# Patient Record
Sex: Male | Born: 1950 | Race: White | Hispanic: No | Marital: Married | Smoking: Former smoker
Health system: Southern US, Community
[De-identification: ages and names within clinical notes are randomized; demographics above are authoritative.]

## PROBLEM LIST (undated history)

## (undated) DIAGNOSIS — G629 Polyneuropathy, unspecified: Secondary | ICD-10-CM

## (undated) DIAGNOSIS — I2510 Atherosclerotic heart disease of native coronary artery without angina pectoris: Secondary | ICD-10-CM

## (undated) DIAGNOSIS — I219 Acute myocardial infarction, unspecified: Secondary | ICD-10-CM

## (undated) DIAGNOSIS — Z972 Presence of dental prosthetic device (complete) (partial): Secondary | ICD-10-CM

## (undated) DIAGNOSIS — I739 Peripheral vascular disease, unspecified: Secondary | ICD-10-CM

## (undated) DIAGNOSIS — M722 Plantar fascial fibromatosis: Secondary | ICD-10-CM

## (undated) DIAGNOSIS — I10 Essential (primary) hypertension: Secondary | ICD-10-CM

## (undated) DIAGNOSIS — I779 Disorder of arteries and arterioles, unspecified: Secondary | ICD-10-CM

## (undated) DIAGNOSIS — J189 Pneumonia, unspecified organism: Secondary | ICD-10-CM

## (undated) DIAGNOSIS — E785 Hyperlipidemia, unspecified: Secondary | ICD-10-CM

## (undated) DIAGNOSIS — G709 Myoneural disorder, unspecified: Secondary | ICD-10-CM

## (undated) DIAGNOSIS — E119 Type 2 diabetes mellitus without complications: Secondary | ICD-10-CM

## (undated) HISTORY — PX: CORONARY ANGIOPLASTY: SHX604

## (undated) HISTORY — PX: TONSILLECTOMY: SUR1361

## (undated) HISTORY — PX: FRACTURE SURGERY: SHX138

## (undated) HISTORY — PX: CAROTID ENDARTERECTOMY: SUR193

---

## 2003-09-14 ENCOUNTER — Other Ambulatory Visit: Payer: Self-pay

## 2004-01-14 ENCOUNTER — Emergency Department: Payer: Self-pay | Admitting: Emergency Medicine

## 2007-01-11 ENCOUNTER — Other Ambulatory Visit: Payer: Self-pay

## 2007-01-11 ENCOUNTER — Ambulatory Visit: Payer: Self-pay | Admitting: Family Medicine

## 2007-01-12 ENCOUNTER — Inpatient Hospital Stay: Payer: Self-pay | Admitting: Internal Medicine

## 2007-01-13 ENCOUNTER — Other Ambulatory Visit: Payer: Self-pay

## 2007-01-14 ENCOUNTER — Other Ambulatory Visit: Payer: Self-pay

## 2007-02-06 ENCOUNTER — Encounter: Payer: Self-pay | Admitting: Internal Medicine

## 2007-02-18 ENCOUNTER — Encounter: Payer: Self-pay | Admitting: Internal Medicine

## 2007-03-21 ENCOUNTER — Encounter: Payer: Self-pay | Admitting: Internal Medicine

## 2007-04-21 ENCOUNTER — Encounter: Payer: Self-pay | Admitting: Internal Medicine

## 2007-05-19 ENCOUNTER — Encounter: Payer: Self-pay | Admitting: Internal Medicine

## 2007-06-19 ENCOUNTER — Encounter: Payer: Self-pay | Admitting: Internal Medicine

## 2010-02-07 ENCOUNTER — Emergency Department: Payer: Self-pay | Admitting: Emergency Medicine

## 2010-02-11 ENCOUNTER — Ambulatory Visit: Payer: Self-pay | Admitting: Family Medicine

## 2010-02-18 ENCOUNTER — Ambulatory Visit: Payer: Self-pay | Admitting: Neurology

## 2010-03-15 ENCOUNTER — Ambulatory Visit: Payer: Self-pay | Admitting: Gastroenterology

## 2012-04-01 ENCOUNTER — Ambulatory Visit: Payer: Self-pay | Admitting: Family Medicine

## 2012-04-20 ENCOUNTER — Ambulatory Visit: Payer: Self-pay | Admitting: Family Medicine

## 2012-06-03 ENCOUNTER — Ambulatory Visit: Payer: Self-pay | Admitting: Family Medicine

## 2012-06-18 ENCOUNTER — Ambulatory Visit: Payer: Self-pay | Admitting: Family Medicine

## 2012-07-29 ENCOUNTER — Ambulatory Visit: Payer: Self-pay | Admitting: Family Medicine

## 2014-01-02 ENCOUNTER — Inpatient Hospital Stay (HOSPITAL_COMMUNITY)
Admission: AD | Admit: 2014-01-02 | Discharge: 2014-01-08 | DRG: 236 | Disposition: A | Discharge status: Home / Self Care | Payer: Medicare PPO | Source: Other Acute Inpatient Hospital | Attending: Cardiothoracic Surgery | Admitting: Cardiothoracic Surgery

## 2014-01-02 ENCOUNTER — Encounter (HOSPITAL_COMMUNITY): Payer: Self-pay | Admitting: Nurse Practitioner

## 2014-01-02 ENCOUNTER — Other Ambulatory Visit: Payer: Self-pay

## 2014-01-02 ENCOUNTER — Inpatient Hospital Stay (HOSPITAL_COMMUNITY): Payer: Medicare PPO

## 2014-01-02 ENCOUNTER — Ambulatory Visit: Payer: Self-pay | Admitting: Internal Medicine

## 2014-01-02 DIAGNOSIS — I10 Essential (primary) hypertension: Secondary | ICD-10-CM

## 2014-01-02 DIAGNOSIS — Z01810 Encounter for preprocedural cardiovascular examination: Secondary | ICD-10-CM

## 2014-01-02 DIAGNOSIS — E119 Type 2 diabetes mellitus without complications: Secondary | ICD-10-CM | POA: Diagnosis present

## 2014-01-02 DIAGNOSIS — Z87891 Personal history of nicotine dependence: Secondary | ICD-10-CM

## 2014-01-02 DIAGNOSIS — Z955 Presence of coronary angioplasty implant and graft: Secondary | ICD-10-CM | POA: Diagnosis not present

## 2014-01-02 DIAGNOSIS — I2510 Atherosclerotic heart disease of native coronary artery without angina pectoris: Secondary | ICD-10-CM | POA: Diagnosis present

## 2014-01-02 DIAGNOSIS — D62 Acute posthemorrhagic anemia: Secondary | ICD-10-CM | POA: Diagnosis not present

## 2014-01-02 DIAGNOSIS — I4891 Unspecified atrial fibrillation: Secondary | ICD-10-CM | POA: Diagnosis not present

## 2014-01-02 DIAGNOSIS — I25110 Atherosclerotic heart disease of native coronary artery with unstable angina pectoris: Secondary | ICD-10-CM | POA: Diagnosis not present

## 2014-01-02 DIAGNOSIS — E8770 Fluid overload, unspecified: Secondary | ICD-10-CM | POA: Diagnosis not present

## 2014-01-02 DIAGNOSIS — Z7982 Long term (current) use of aspirin: Secondary | ICD-10-CM | POA: Diagnosis not present

## 2014-01-02 DIAGNOSIS — Z79899 Other long term (current) drug therapy: Secondary | ICD-10-CM | POA: Diagnosis not present

## 2014-01-02 DIAGNOSIS — Z7902 Long term (current) use of antithrombotics/antiplatelets: Secondary | ICD-10-CM

## 2014-01-02 DIAGNOSIS — I252 Old myocardial infarction: Secondary | ICD-10-CM | POA: Diagnosis not present

## 2014-01-02 DIAGNOSIS — K5900 Constipation, unspecified: Secondary | ICD-10-CM | POA: Diagnosis not present

## 2014-01-02 DIAGNOSIS — I200 Unstable angina: Secondary | ICD-10-CM

## 2014-01-02 DIAGNOSIS — I25810 Atherosclerosis of coronary artery bypass graft(s) without angina pectoris: Secondary | ICD-10-CM

## 2014-01-02 DIAGNOSIS — E785 Hyperlipidemia, unspecified: Secondary | ICD-10-CM | POA: Diagnosis present

## 2014-01-02 DIAGNOSIS — Z6835 Body mass index (BMI) 35.0-35.9, adult: Secondary | ICD-10-CM | POA: Diagnosis not present

## 2014-01-02 DIAGNOSIS — Z951 Presence of aortocoronary bypass graft: Secondary | ICD-10-CM

## 2014-01-02 DIAGNOSIS — I6523 Occlusion and stenosis of bilateral carotid arteries: Secondary | ICD-10-CM | POA: Diagnosis present

## 2014-01-02 HISTORY — DX: Peripheral vascular disease, unspecified: I73.9

## 2014-01-02 HISTORY — DX: Atherosclerotic heart disease of native coronary artery without angina pectoris: I25.10

## 2014-01-02 HISTORY — DX: Disorder of arteries and arterioles, unspecified: I77.9

## 2014-01-02 HISTORY — DX: Type 2 diabetes mellitus without complications: E11.9

## 2014-01-02 HISTORY — DX: Morbid (severe) obesity due to excess calories: E66.01

## 2014-01-02 HISTORY — DX: Hyperlipidemia, unspecified: E78.5

## 2014-01-02 HISTORY — DX: Essential (primary) hypertension: I10

## 2014-01-02 HISTORY — DX: Pneumonia, unspecified organism: J18.9

## 2014-01-02 HISTORY — DX: Acute myocardial infarction, unspecified: I21.9

## 2014-01-02 LAB — PULMONARY FUNCTION TEST
FEF 25-75 Post: 3.49 L/sec
FEF 25-75 Pre: 2.58 L/sec
FEF2575-%Change-Post: 35 %
FEF2575-%Pred-Post: 124 %
FEF2575-%Pred-Pre: 92 %
FEV1-%Change-Post: 9 %
FEV1-%Pred-Post: 81 %
FEV1-%Pred-Pre: 74 %
FEV1-Post: 2.88 L
FEV1-Pre: 2.63 L
FEV1FVC-%Change-Post: 1 %
FEV1FVC-%Pred-Pre: 106 %
FEV6-%Change-Post: 7 %
FEV6-%Pred-Post: 78 %
FEV6-%Pred-Pre: 72 %
FEV6-Post: 3.55 L
FEV6-Pre: 3.29 L
FEV6FVC-%Pred-Post: 105 %
FEV6FVC-%Pred-Pre: 105 %
FVC-%Change-Post: 7 %
FVC-%Pred-Post: 74 %
FVC-%Pred-Pre: 69 %
FVC-Post: 3.55 L
FVC-Pre: 3.29 L
Post FEV1/FVC ratio: 81 %
Post FEV6/FVC ratio: 100 %
Pre FEV1/FVC ratio: 80 %
Pre FEV6/FVC Ratio: 100 %

## 2014-01-02 LAB — URINALYSIS, ROUTINE W REFLEX MICROSCOPIC
Bilirubin Urine: NEGATIVE
Glucose, UA: NEGATIVE mg/dL
Hgb urine dipstick: NEGATIVE
Ketones, ur: NEGATIVE mg/dL
Leukocytes, UA: NEGATIVE
Nitrite: NEGATIVE
Protein, ur: NEGATIVE mg/dL
Specific Gravity, Urine: 1.024 (ref 1.005–1.030)
Urobilinogen, UA: 0.2 mg/dL (ref 0.0–1.0)
pH: 5.5 (ref 5.0–8.0)

## 2014-01-02 LAB — MRSA PCR SCREENING: MRSA by PCR: NEGATIVE

## 2014-01-02 LAB — HEPARIN LEVEL (UNFRACTIONATED): Heparin Unfractionated: 0.17 IU/mL — ABNORMAL LOW (ref 0.30–0.70)

## 2014-01-02 LAB — SURGICAL PCR SCREEN
MRSA, PCR: NEGATIVE
Staphylococcus aureus: POSITIVE — AB

## 2014-01-02 LAB — APTT: APTT: 42 s — AB (ref 24–37)

## 2014-01-02 LAB — GLUCOSE, CAPILLARY
Glucose-Capillary: 285 mg/dL — ABNORMAL HIGH (ref 70–99)
Glucose-Capillary: 282 mg/dL — ABNORMAL HIGH (ref 70–99)

## 2014-01-02 LAB — TYPE AND SCREEN
ABO/RH(D): AB NEG
ANTIBODY SCREEN: NEGATIVE

## 2014-01-02 LAB — PROTIME-INR
INR: 1.08 (ref 0.00–1.49)
Prothrombin Time: 14.2 seconds (ref 11.6–15.2)

## 2014-01-02 LAB — ABO/RH: ABO/RH(D): AB NEG

## 2014-01-02 LAB — PLATELET INHIBITION P2Y12: Platelet Function  P2Y12: 247 [PRU] (ref 194–418)

## 2014-01-02 MED ORDER — INSULIN ASPART 100 UNIT/ML ~~LOC~~ SOLN
0.0000 [IU] | Freq: Three times a day (TID) | SUBCUTANEOUS | Status: DC
  Administered 2014-01-02: 8 [IU] via SUBCUTANEOUS

## 2014-01-02 MED ORDER — SODIUM CHLORIDE 0.9 % IV SOLN
INTRAVENOUS | Status: AC
  Administered 2014-01-03: 69.8 mL/h via INTRAVENOUS
  Filled 2014-01-02: qty 40

## 2014-01-02 MED ORDER — LIRAGLUTIDE 18 MG/3ML ~~LOC~~ SOPN: 1.8000 mg | PEN_INJECTOR | Freq: Every day | SUBCUTANEOUS | Status: DC

## 2014-01-02 MED ORDER — CHLORHEXIDINE GLUCONATE CLOTH 2 % EX PADS: 6.0000 | MEDICATED_PAD | Freq: Every day | CUTANEOUS | Status: DC

## 2014-01-02 MED ORDER — TEMAZEPAM 15 MG PO CAPS
15.0000 mg | ORAL_CAPSULE | Freq: Once | ORAL | Status: AC | PRN
  Administered 2014-01-02: 15 mg via ORAL
  Filled 2014-01-02: qty 1

## 2014-01-02 MED ORDER — MAGNESIUM SULFATE 50 % IJ SOLN
40.0000 meq | INTRAMUSCULAR | Status: DC
  Filled 2014-01-02: qty 10

## 2014-01-02 MED ORDER — CHLORHEXIDINE GLUCONATE CLOTH 2 % EX PADS
6.0000 | MEDICATED_PAD | Freq: Once | CUTANEOUS | Status: DC
Start: 2014-01-02 — End: 2014-01-02

## 2014-01-02 MED ORDER — ONDANSETRON HCL 4 MG/2ML IJ SOLN: 4.0000 mg | Freq: Four times a day (QID) | INTRAMUSCULAR | Status: DC | PRN

## 2014-01-02 MED ORDER — ACETAMINOPHEN 325 MG PO TABS
650.0000 mg | ORAL_TABLET | ORAL | Status: DC | PRN
  Administered 2014-01-03: 650 mg via ORAL
  Filled 2014-01-02: qty 2

## 2014-01-02 MED ORDER — HEPARIN (PORCINE) IN NACL 100-0.45 UNIT/ML-% IJ SOLN
1600.0000 [IU]/h | INTRAMUSCULAR | Status: DC
  Administered 2014-01-02: 1300 [IU]/h via INTRAVENOUS
  Filled 2014-01-02 (×2): qty 250

## 2014-01-02 MED ORDER — CHLORHEXIDINE GLUCONATE CLOTH 2 % EX PADS: 6.0000 | MEDICATED_PAD | Freq: Once | CUTANEOUS | Status: DC

## 2014-01-02 MED ORDER — ADULT MULTIVITAMIN W/MINERALS CH
1.0000 | ORAL_TABLET | Freq: Every day | ORAL | Status: DC
  Filled 2014-01-02: qty 1

## 2014-01-02 MED ORDER — ASPIRIN EC 81 MG PO TBEC
81.0000 mg | DELAYED_RELEASE_TABLET | Freq: Every evening | ORAL | Status: DC
  Administered 2014-01-02: 81 mg via ORAL
  Filled 2014-01-02 (×2): qty 1

## 2014-01-02 MED ORDER — PLASMA-LYTE 148 IV SOLN
INTRAVENOUS | Status: DC
  Filled 2014-01-02: qty 2.5

## 2014-01-02 MED ORDER — SODIUM CHLORIDE 0.9 % IV SOLN
250.0000 mL | INTRAVENOUS | Status: DC | PRN
  Administered 2014-01-03: 10:00:00 via INTRAVENOUS

## 2014-01-02 MED ORDER — BISACODYL 5 MG PO TBEC: 5.0000 mg | DELAYED_RELEASE_TABLET | Freq: Once | ORAL | Status: DC

## 2014-01-02 MED ORDER — ALPRAZOLAM 0.25 MG PO TABS: 0.2500 mg | ORAL_TABLET | Freq: Every evening | ORAL | Status: DC | PRN

## 2014-01-02 MED ORDER — SIMVASTATIN 40 MG PO TABS
40.0000 mg | ORAL_TABLET | Freq: Every morning | ORAL | Status: DC
  Administered 2014-01-04 – 2014-01-05 (×2): 40 mg via ORAL
  Filled 2014-01-02 (×3): qty 1

## 2014-01-02 MED ORDER — DEXMEDETOMIDINE HCL IN NACL 400 MCG/100ML IV SOLN
0.1000 ug/kg/h | INTRAVENOUS | Status: AC
  Administered 2014-01-03: .5 ug/kg/h via INTRAVENOUS
  Filled 2014-01-02: qty 100

## 2014-01-02 MED ORDER — DEXTROSE 5 % IV SOLN
750.0000 mg | INTRAVENOUS | Status: DC
  Filled 2014-01-02: qty 750

## 2014-01-02 MED ORDER — DEXTROSE 5 % IV SOLN
1.5000 g | INTRAVENOUS | Status: AC
  Administered 2014-01-03: 1.5 g via INTRAVENOUS
  Administered 2014-01-03: .75 g via INTRAVENOUS
  Filled 2014-01-02: qty 1.5

## 2014-01-02 MED ORDER — NITROGLYCERIN 0.4 MG SL SUBL
0.4000 mg | SUBLINGUAL_TABLET | SUBLINGUAL | Status: DC | PRN
  Administered 2014-01-02: 0.4 mg via SUBLINGUAL
  Filled 2014-01-02: qty 1

## 2014-01-02 MED ORDER — ISOSORBIDE MONONITRATE ER 60 MG PO TB24
60.0000 mg | ORAL_TABLET | Freq: Every day | ORAL | Status: DC
  Filled 2014-01-02: qty 1

## 2014-01-02 MED ORDER — MUPIROCIN 2 % EX OINT
1.0000 "application " | TOPICAL_OINTMENT | Freq: Two times a day (BID) | CUTANEOUS | Status: DC
  Administered 2014-01-02: 1 via NASAL
  Filled 2014-01-02: qty 22

## 2014-01-02 MED ORDER — CARVEDILOL 12.5 MG PO TABS
12.5000 mg | ORAL_TABLET | Freq: Two times a day (BID) | ORAL | Status: DC
  Administered 2014-01-02: 12.5 mg via ORAL
  Filled 2014-01-02 (×4): qty 1

## 2014-01-02 MED ORDER — CHLORHEXIDINE GLUCONATE CLOTH 2 % EX PADS
6.0000 | MEDICATED_PAD | Freq: Once | CUTANEOUS | Status: AC
  Administered 2014-01-02: 6 via TOPICAL

## 2014-01-02 MED ORDER — POTASSIUM CHLORIDE 2 MEQ/ML IV SOLN
80.0000 meq | INTRAVENOUS | Status: DC
  Filled 2014-01-02: qty 40

## 2014-01-02 MED ORDER — EPINEPHRINE HCL 1 MG/ML IJ SOLN
0.5000 ug/min | INTRAVENOUS | Status: DC
  Filled 2014-01-02: qty 4

## 2014-01-02 MED ORDER — DEXTROSE 5 % IV SOLN
30.0000 ug/min | INTRAVENOUS | Status: AC
  Administered 2014-01-03: 40 ug/min via INTRAVENOUS
  Filled 2014-01-02: qty 2

## 2014-01-02 MED ORDER — SODIUM CHLORIDE 0.9 % IJ SOLN: 3.0000 mL | INTRAMUSCULAR | Status: DC | PRN

## 2014-01-02 MED ORDER — ALBUTEROL SULFATE (2.5 MG/3ML) 0.083% IN NEBU
2.5000 mg | INHALATION_SOLUTION | Freq: Once | RESPIRATORY_TRACT | Status: AC
  Administered 2014-01-02: 2.5 mg via RESPIRATORY_TRACT

## 2014-01-02 MED ORDER — VITAMIN C 500 MG PO TABS
1000.0000 mg | ORAL_TABLET | Freq: Every day | ORAL | Status: DC
  Filled 2014-01-02: qty 2

## 2014-01-02 MED ORDER — METOPROLOL TARTRATE 12.5 MG HALF TABLET
12.5000 mg | ORAL_TABLET | Freq: Once | ORAL | Status: AC
  Administered 2014-01-03: 12.5 mg via ORAL
  Filled 2014-01-02: qty 1

## 2014-01-02 MED ORDER — METOPROLOL TARTRATE 12.5 MG HALF TABLET
12.5000 mg | ORAL_TABLET | Freq: Once | ORAL | Status: DC
Start: 2014-01-03 — End: 2014-01-02

## 2014-01-02 MED ORDER — VANCOMYCIN HCL 10 G IV SOLR
1250.0000 mg | INTRAVENOUS | Status: AC
  Administered 2014-01-03: 1500 mg via INTRAVENOUS
  Filled 2014-01-02: qty 1250

## 2014-01-02 MED ORDER — DOPAMINE-DEXTROSE 3.2-5 MG/ML-% IV SOLN
0.0000 ug/kg/min | INTRAVENOUS | Status: AC
  Administered 2014-01-03: 3 ug/kg/min via INTRAVENOUS
  Filled 2014-01-02: qty 250

## 2014-01-02 MED ORDER — SODIUM CHLORIDE 0.9 % IV SOLN
INTRAVENOUS | Status: DC
  Filled 2014-01-02: qty 30

## 2014-01-02 MED ORDER — TEMAZEPAM 15 MG PO CAPS: 15.0000 mg | ORAL_CAPSULE | Freq: Once | ORAL | Status: DC | PRN

## 2014-01-02 MED ORDER — SODIUM CHLORIDE 0.9 % IV SOLN
INTRAVENOUS | Status: AC
  Administered 2014-01-03: 3.6 [IU]/h via INTRAVENOUS
  Filled 2014-01-02: qty 2.5

## 2014-01-02 MED ORDER — NITROGLYCERIN IN D5W 200-5 MCG/ML-% IV SOLN
2.0000 ug/min | INTRAVENOUS | Status: AC
  Administered 2014-01-03: 5 ug/min via INTRAVENOUS
  Filled 2014-01-02: qty 250

## 2014-01-02 MED ORDER — LISINOPRIL 40 MG PO TABS
40.0000 mg | ORAL_TABLET | Freq: Every day | ORAL | Status: DC
  Filled 2014-01-02: qty 1

## 2014-01-02 MED ORDER — SODIUM CHLORIDE 0.9 % IJ SOLN
3.0000 mL | Freq: Two times a day (BID) | INTRAMUSCULAR | Status: DC
  Administered 2014-01-02 (×2): 3 mL via INTRAVENOUS

## 2014-01-02 MED ORDER — CHLORHEXIDINE GLUCONATE CLOTH 2 % EX PADS: 6.0000 | MEDICATED_PAD | Freq: Once | CUTANEOUS | Status: AC

## 2014-01-02 NOTE — Progress Notes (Signed)
[id number] Discussed with pt importance of mobility and IS after surgery. Pt can get 2500 ml on IS. Discussed sternal precautions and showed pt how to get up and down without using arms. Gave [facility] booklet and care guide. Put on pre op video to view. Notified pt that he would need someone with him 24/7 first week after surgery.  Will follow up after surgery. Luetta Nuttingharlene Shinita Mac RN BSN 01/02/2014 2:56 PM

## 2014-01-02 NOTE — Consult Note (Signed)
Patient examined and coronary angiograms reviewed Critical left main stenosis with fairly well preserved LV function which will need surgical coronary revascularization.  Patient has been on Plavix since a RCA stent over 5 years ago. The patient had breakfast today, the patient took his Plavix today, the patient has had no chest pain today. We will plan on multivessel CABG tomorrow and check a platelet function study-P2Y 12 assay before surgery.  I've discussed the patient with Ms. Janeann ForehandZimmerman PA-C and agree with the above findings and plan of care.  I discussed the procedure of CABG with the patient and his family and they understand the indications, benefits, alternatives, and risks and agreed to proceed.

## 2014-01-02 NOTE — Care Management Note (Addendum)
    Page 1 of 1   01/08/2014     1:48:01 PM CARE MANAGEMENT NOTE 01/08/2014  Patient:  Jake Castro,Jake Castro   Account Number:  [id number]  Date Initiated:  01/02/2014  Documentation initiated by:  Junius CreamerWELL,DEBBIE  Subjective/Objective Assessment:   adm w pos card cath     Action/Plan:   lives w wife, pcp dr Onalee Huadavid bronstein   Anticipated DC Date:  01/08/2014   Anticipated DC Plan:  HOME W HOME HEALTH SERVICES      DC Planning Services  CM consult      Choice offered to / List presented to:             Status of service:  Completed, signed off Medicare Important Message given?  YES (If response is "NO", the following Medicare IM given date fields will be blank) Date Medicare IM given:  01/05/2014 Medicare IM given by:  MAYO,HENRIETTA Date Additional Medicare IM given:  01/08/2014 Additional Medicare IM given by:  Greggory Safranek  Discharge Disposition:  HOME/SELF CARE  Per UR Regulation:  Reviewed for med. necessity/level of care/duration of stay  If discussed at Long Length of Stay Meetings, dates discussed:    Comments:  01/05/14 1050 Henrietta Mayo RN MSN BSN CCM Per pt, he will have 24/7 assistance as his wife will be available most of the time and he has a friend who has offered to stay with him when she is not available.

## 2014-01-02 NOTE — Progress Notes (Signed)
Called lab for blood works and claimed that they are coming.

## 2014-01-02 NOTE — Progress Notes (Addendum)
VASCULAR LAB PRELIMINARY  PRELIMINARY  PRELIMINARY  PRELIMINARY  Pre-op Cardiac Surgery  Carotid Findings:  Right:  40-59% internal carotid artery stenosis at the high end of the scale.   Left:  1-39% ICA stenosis.  Bilateral:  Vertebral artery flow is antegrade.    Upper Extremity Right Left  Brachial Pressures 128  Triphasic  121  Triphasic   Radial Waveforms Triphasic  Triphasic   Ulnar Waveforms Triphasic  Triphasic   Palmar Arch (Allen's Test) Within normal limits  Within normal limits     Lower  Extremity Right Left  Dorsalis Pedis 117  Monophasic  143  Triphasic   Anterior Tibial    Posterior Tibial 141  Triphasic  132  Triphasic   Ankle/Brachial Indices 1.1 1.12     Aila Terra, RVT 01/02/2014, 5:45 PM

## 2014-01-02 NOTE — H&P (Signed)
Patient ID: Jake Castro MRN: [id number], DOB/AGE: 08-28-1950   Admit date: 01/02/2014  Primary Physician: Dorothey Baseman, MD Primary Cardiologist: Irena Cords [facility])  Pt. Profile:  63 y/o male with a h/o CAD and recent abnl stress test who presents on tx from [facility] following cath today, which revealed severe LM dzs.  Problem List  Past Medical History  Diagnosis Date  . CAD (coronary artery disease)     a. 12/2006 NSTEMI/PCI: RCA 27m/d->mid vessel stented, unable to cross dist dzs w/ balloon;  b. 11/2013 NL MV;  c. 11/2013 Echo: EF 55%;  d. 12/2013 Cath: LM 95 ost, LAD 70p, 72m, LCX 30p/m, OM1 60, RCA 20p, 25m/4m, 95d.  Marland Kitchen HTN (hypertension)   . Hyperlipidemia   . Diabetes mellitus   . Carotid arterial disease     a. s/p LCEA  . Morbid obesity     No past surgical history on file.   Allergies  Allergies  Allergen Reactions  . Kiwi Extract Nausea And Vomiting and Other (See Comments)    Throat bumps on throat    HPI  63 y/o male with the above complex past medical history.  He has a h/o L CEA in ~ 2003 and NSTEMI in 2008 with PCI of the RCA @ that time.  He is followed by [facility] and had done well since his PCI in 2008.  He says that he was relatively sedentary for several years but over the summer he bought a dog and noted that while walking his dog, he would develop sscp and dyspnea.  This was present on a daily basis and he saw his primary cardiologist in August.  He underwent echo, which showed an EF of 55%, and that was followed by stress testing which was apparently nl.  Unfortunately, he continued to have exertional chest pain and dyspnea and was placed on imdur 30 in early Sept and later this was increased to 60mg  daily.  He continued to have progressive c/p and saw his cardiologist last week and decision was made to pursue cath.  This was carried out today and revealed severe, 95% ostial LM stenosis with moderate LAD and severe mid-distal RCA dzs.   It was felt that he would require CABG and pt requested tx to [facility].  Currently he is pain free and w/o complaints.   His sheath was pulled @ ~ 8:30 this morning.   Home Medications  Prior to Admission medications   Medication Sig Start Date End Date Taking? Authorizing Provider  ALPRAZolam Prudy Feeler) 0.5 MG tablet Take 0.25-0.5 mg by mouth at bedtime as needed for anxiety or sleep.   Yes Historical Provider, MD  Ascorbic Acid (VITAMIN C) 1000 MG tablet Take 1,000 mg by mouth daily.   Yes Historical Provider, MD  aspirin EC 81 MG tablet Take 81 mg by mouth every evening.   Yes Historical Provider, MD  B Complex Vitamins (VITAMIN B-COMPLEX PO) Take 1 tablet by mouth daily.   Yes Historical Provider, MD  carvedilol (COREG) 12.5 MG tablet Take 12.5 mg by mouth 2 (two) times daily with a meal.   Yes Historical Provider, MD  Cholecalciferol (VITAMIN D-3) 5000 UNITS TABS Take 5,000 mg by mouth daily.   Yes Historical Provider, MD  clopidogrel (PLAVIX) 75 MG tablet Take 75 mg by mouth daily.   Yes Historical Provider, MD  isosorbide mononitrate (IMDUR) 30 MG 24 hr tablet Take 60 mg by mouth daily.   Yes Historical Provider, MD  KRILL  OIL PO Take 1 tablet by mouth daily.   Yes Historical Provider, MD  Liraglutide (VICTOZA) 18 MG/3ML SOPN Inject 1.8 mg into the skin daily.   Yes Historical Provider, MD  lisinopril (PRINIVIL,ZESTRIL) 40 MG tablet Take 40 mg by mouth daily.   Yes Historical Provider, MD  metFORMIN (GLUCOPHAGE) 1000 MG tablet Take 1,000 mg by mouth 2 (two) times daily with a meal.   Yes Historical Provider, MD  Multiple Vitamin (MULTIVITAMIN WITH MINERALS) TABS tablet Take 1 tablet by mouth daily.   Yes Historical Provider, MD  simvastatin (ZOCOR) 40 MG tablet Take 40 mg by mouth every morning.   Yes Historical Provider, MD  VITAMIN E PO Take 1 capsule by mouth daily.   Yes Historical Provider, MD  VITAMIN K PO Take 1 tablet by mouth daily.   Yes Historical Provider, MD   Family  History  Family History  Problem Relation Age of Onset  . Peripheral vascular disease Father     died in his 1490's - had CEA.  . Peripheral vascular disease Brother     s/p CEA  . Stroke Brother     in his 2850's  . COPD Mother     died in her 9190's.    Social History  History   Social History  . Marital Status: Married    Spouse Name: N/A    Number of Children: N/A  . Years of Education: N/A   Occupational History  . Not on file.   Social History Main Topics  . Smoking status: Former Games developermoker  . Smokeless tobacco: Not on file     Comment: smoked for a few months in his early 20's.  . Alcohol Use: No  . Drug Use: No     Comment: Previously used illicit drugs.  None in > 5 yrs.  . Sexual Activity: Not on file   Other Topics Concern  . Not on file   Social History Narrative   [place].  Was not routinely exercising.    Review of Systems General:  No chills, fever, night sweats or weight changes.  Cardiovascular:  +++ exertional chest pain, +++ dyspnea on exertion, no edema, orthopnea, palpitations, paroxysmal nocturnal dyspnea. Dermatological: No rash, lesions/masses Respiratory: No cough, +++ dyspnea Urologic: No hematuria, dysuria Abdominal:   No nausea, vomiting, diarrhea, bright red blood per rectum, melena, or hematemesis Neurologic:  No visual changes, wkns, changes in mental status. All other systems reviewed and are otherwise negative except as noted above.  Physical Exam  Blood pressure 124/60, pulse 69, temperature 98.7 F (37.1 C), temperature source Oral, resp. rate 16, height 5\' 11"  (1.803 m), weight 263 lb 0.1 oz (119.3 kg), SpO2 98.00%.  General: Pleasant, NAD Psych: Normal affect. Neuro: Alert and oriented X 3. Moves all extremities spontaneously. HEENT: Normal  Neck: Supple without bruits or JVD. Lungs:  Resp regular and unlabored, CTA. Heart: RRR no s3, s4, or murmurs. Abdomen: Soft, non-tender, non-distended, BS + x  4.  Extremities: No clubbing, cyanosis or edema. DP/PT/Radials 2+ and equal bilaterally.  R groin w/o bleeding/bruit/hematoma.  Labs  Pending   Radiology/Studies  No results found.  ECG  pending  ASSESSMENT AND PLAN  1.  USA/CAD:  Several month h/o progressive exertional c/p, dyspnea, and reduced exercise tolerance.  S/P cath today revealing severe LM/RCA dzs with moderate LAD stenosis.  Currently pain free.  He has been seen by CT surgery with plan for P2Y12 and CABG once P2Y12  acceptable.  Cont asa, statin, bb, long-acting nitrate.  Add heparin.  2.  HTN:  Stable. Cont home meds.  3.  HL:  Cont statin.  4.  DM II:  Add ssi. Cont home regimen.  5.  Morbid Obesity:  Cardiac rehab post-op.  6.  Carotid Arterial Dzs s/p LCEA:  Says he had carotid dopplers [facility] in Aug/Sept - 50% bilat dzs per his report.  Cont asa/statin.  Signed, Nicolasa Duckinghristopher Berge, NP 01/02/2014, 2:43 PM   The patient was seen, examined and discussed with Saralyn Pilarhris Berger, NP and agree as above.  63 year old male with prior history of CAD, NSTEMI/PCI RCA in 2008 who underwent an elective cardiac cath for unstable angina with findings of 95% ostial LM stenosis with moderate LAD and severe mid-distal RCA dzs. [facility]. He has been on Plavix, the last dose this am. Surgery has to see him tonight for potential CABG in the am if P2Y12 levels acceptable. P2Y12 levels ordered stat.  He is currently chest pain free, on Heparin infusion. We will continue ASA, statin, carvedilol. NPO after midnight.  Lars MassonELSON, [place] Thresher H 01/02/2014

## 2014-01-02 NOTE — Progress Notes (Signed)
Per RN, ABG in not stat, and can be done on night shift (for pre-op).  Will report to night shift.  RT unavail. Charge RT and RN aware.

## 2014-01-02 NOTE — Progress Notes (Signed)
ANTICOAGULATION CONSULT NOTE - Initial Consult  Pharmacy Consult for Heparin Indication: chest pain/ACS  Allergies  Allergen Reactions  . Kiwi Extract Nausea And Vomiting and Other (See Comments)    Throat bumps on throat     Patient Measurements: Height: 5\' 11"  (180.3 cm) Weight: 263 lb 0.1 oz (119.3 kg) IBW/kg (Calculated) : 75.3  Vital Signs: Temp: 98.7 F (37.1 C) (10/16 1330) Temp Source: Oral (10/16 1330) BP: 124/60 mmHg (10/16 1330) Pulse Rate: 69 (10/16 1330)  Labs: No results found for this basename: HGB, HCT, PLT, APTT, LABPROT, INR, HEPARINUNFRC, CREATININE, CKTOTAL, CKMB, TROPONINI,  in the last 72 hours  CrCl is unknown because no creatinine reading has been taken.   Medical History: Past Medical History  Diagnosis Date  . CAD (coronary artery disease)     a. 12/2006 NSTEMI/PCI: RCA 8674m/d->mid vessel stented, unable to cross dist dzs w/ balloon;  b. 11/2013 NL MV;  c. 11/2013 Echo: EF 55%;  d. 12/2013 Cath: LM 95 ost, LAD 70p, 3237m, LCX 30p/m, OM1 60, RCA 20p, 4123m/17m, 95d.  Marland Kitchen. HTN (hypertension)   . Hyperlipidemia   . Diabetes mellitus   . Carotid arterial disease     a. s/p LCEA  . Morbid obesity   . Myocardial infarction   . Pneumonia     Medications:  Prescriptions prior to admission  Medication Sig Dispense Refill  . ALPRAZolam (XANAX) 0.5 MG tablet Take 0.25-0.5 mg by mouth at bedtime as needed for anxiety or sleep.      . Ascorbic Acid (VITAMIN C) 1000 MG tablet Take 1,000 mg by mouth daily.      Marland Kitchen. aspirin EC 81 MG tablet Take 81 mg by mouth every evening.      . B Complex Vitamins (VITAMIN B-COMPLEX PO) Take 1 tablet by mouth daily.      . carvedilol (COREG) 12.5 MG tablet Take 12.5 mg by mouth 2 (two) times daily with a meal.      . Cholecalciferol (VITAMIN D-3) 5000 UNITS TABS Take 5,000 mg by mouth daily.      . clopidogrel (PLAVIX) 75 MG tablet Take 75 mg by mouth daily.      . isosorbide mononitrate (IMDUR) 30 MG 24 hr tablet Take 60 mg by  mouth daily.      Marland Kitchen. KRILL OIL PO Take 1 tablet by mouth daily.      . Liraglutide (VICTOZA) 18 MG/3ML SOPN Inject 1.8 mg into the skin daily.      Marland Kitchen. lisinopril (PRINIVIL,ZESTRIL) 40 MG tablet Take 40 mg by mouth daily.      . metFORMIN (GLUCOPHAGE) 1000 MG tablet Take 1,000 mg by mouth 2 (two) times daily with a meal.      . Multiple Vitamin (MULTIVITAMIN WITH MINERALS) TABS tablet Take 1 tablet by mouth daily.      . simvastatin (ZOCOR) 40 MG tablet Take 40 mg by mouth every morning.      Marland Kitchen. VITAMIN E PO Take 1 capsule by mouth daily.      Marland Kitchen. VITAMIN K PO Take 1 tablet by mouth daily.        Assessment: 63 yo M admitted 01/02/2014  S[facility] with 95% LM.  Pharmacy consulted to dose heparin.    Coag/Heme:  ACS, to start heparin no bolus with cath today.  CBC wnl  Renal: SCr 1.0 (@[facility] )  Goal of Therapy:  Heparin level 0.3-0.7 units/ml Monitor platelets by anticoagulation protocol: Yes   Plan:  1.  Heparin 1300 units/hr, now bolus. 2. Heparin level 6h after ggt starts 3. Follow up timing to stop heparin on call to [facility].  Thank you for allowing pharmacy to be a part of this patients care team.  Lovenia KimJulie Yoni Lobos Pharm.D., BCPS, AQ-Cardiology Clinical Pharmacist 01/02/2014 4:09 PM Pager: [phone] Phone: [phone]

## 2014-01-02 NOTE — Consult Note (Signed)
[address]       [place] [address]             [phone]        Jake Castro [facility] Medical Record #[id number] Date of Birth: March 14, 1951  Referring: Dr. Gwen Pounds Primary Care: Dorothey Baseman, MD  Chief Complaint:   Chest pain with exertion   History of Present Illness:     This is a 63 year old Caucasian male with cardiac risk factors that include myocardial infarction (7 years ago),diabetes mellitus, hypertension, hyperlipidemia, and remote tobacco abuse who has had retro sternal chest pain with exertion. This occurs several times per week. The pain sometimes radiates to both arms and neck and is associated with shortness of breath. He states he takes Imdur and rests when symptoms occur. He underwent a cardiac catheterization at [facility] by Dr. Gwen Pounds earlier this morning. Results showed LVEF was 55%, 95%ostial left main stenosis, proximal 70% LAD stenosis, OM1 60% stenosis, and distal RCA with 95% stenosis. In addition, he has previously had an echo which showed LVEF 50%, no AS or AI, mild MR and no MS, and no TR or PR.[name] cardiology was asked to admit the patient and Dr. Donata Clay was consulted for the consideration of coronary artery bypass grafting surgery. It should be noted that currently the patient is chest pain free. His last episode of clenching type chest pain occurred with exertion, radiated down both arms and into neck was at 4 pm yesterday.  He states he had Plavix (not a loading dose) this morning and he last ate at 10:00 am. Currently, he is in no acute distress and vital signs are stable. He is chest pain free and does not have any shortness of breath.   Current Activity/ Functional Status: Patient is independent with mobility/ambulation, transfers, ADL's, IADL's.   Zubrod Score: At the time of surgery this patient's most appropriate activity status/level should be described as: []     0    Normal activity, no symptoms [x]      1    Restricted in physical strenuous activity but ambulatory, able to do out light work []     2    Ambulatory and capable of self care, unable to do work activities, up and about                 more than 50%  Of the time                            []     3    Only limited self care, in bed greater than 50% of waking hours []     4    Completely disabled, no self care, confined to bed or chair []     5    Moribund  Past Medical History: 1.Myocardial Infarction (NSTEMI) 2008 (about 7 years ago) 2. Essential, benign hypertension 3. Type II DM 4. Hyperlipidemia 5. Anemia 6. Decreased free testosterone level 7.Hemorrhoids 8. History of left carotid artery disease 9. Obesity  Surgical History: 1. Left carotid endarterectomy (over 12 years ago) 2. Colonoscopy (03/15/2014) 3. PCI with stent to RCA  History   Social History  . Marital Status: Married    Spouse Name: N/A    Number of Children: N/A  . Years of Education: N/A   Occupational History  . Criminal lawyer   Social History Main Topics  . Smoking status:  Quit tobacco about 42 years ago   . Smokeless tobacco: Quit smokeless tobacco about 44 years ago.  . Alcohol Use: Socially  . Drug Use: Denies at this time but did in his earlier years  . Sexual Activity: Not on file   Allergies: Kiwi which causes nausea and vomiting. Not allergic to any medications.  Family History: Mother is deceased (in her 3490's) and had COPD Father deceased (around age 995) and has history of carotid artery stenosis.  Medications prior to admission: Xanax 0.5 mg one-half to one tablet by mouth at bedtime PRN Aspirin 81 mg tablet by mouth daily Wellbutrin XL 150 mg tablet by mouth daily Coreg 12.5 mg one tablet by mouth two times daily with meals Plavix 75 mg one tablet by mouth daily Imdur 30 mg one tablet by mouth daily (patient takes 60 mg if having chest pain) Metanx one tablet by mouth daily Victoza 0.3 mL (1.8 mg total) SQ  daily Lisinopril 40 mg one tablet by mouth daily Metformin 1000 mg by mouth two times daily with meals Zocor 40 mg one tablet by mouth at bedtime  Review of Systems:     Cardiac Review of Systems: Y or N  Chest Pain [  Y  ]  Resting SOB [ N  ] Exertional SOB  [ Y ]  Orthopnea [name][N  ]   Pedal Edema [  N ]    Palpitations [ N ] Syncope  [ N ]   Presyncope [  N ]  General Review of Systems: [Y] = yes [  ]=no Constitional: recent weight change [ N ]; anorexia [ N ]; fatigue [ Y ]; nausea [name][N  ]; night sweats [ N ]; fever [  N]; or chills [ N ]                                                                        Eye : blurred vision [ N ]; diplopia [ N  ]; vision changes [ N ];  Amaurosis fugax[  N]; Resp: cough [ N ];  wheezing[ N ];  hemoptysis[ N ];  GI: vomiting[ N ];  dysphagia[N  ]; melena[N  ];  hematochezia [name][N  ]; heartburn[  N];   Hx of  Colonoscopy[ Y ]; GU: kidney stones [ N ]; hematuria[N  ];   dysuria [ N ];  nocturia[ N ];  history of obstruction [  N]; urinary frequency [ N ]             Skin: rash, swelling[ N ];, hair loss[ N ];  peripheral edema[  N];  or itching[  N]; Musculosketetal: myalgias[ N ];  joint swelling[N  ];  joint erythema[ N ]; joint pain[N  ];  back pain[ N ];  Heme/Lymph: bruising[ N ];  bleeding[N  ];  anemia[ Y ];  Neuro: TIA[ N ];  stroke[ N ];  vertigo[N  ];  seizures[ N ];   paresthesias[ N ];  difficulty walking[N  ];  Psych:anxiety[Y  ];  Endocrine: diabetes[ Y ];  thyroid dysfunction[  N];   Physical Exam: General appearance: alert, cooperative and no distress HEENT: Head-atraumatic, normocephalic Eyes-EOMI, PERRLA, sclera are non icteric Neck-supple, no JVD,  no carotid bruit Heart: regular rate and rhythm, S1, S2 normal, no murmur, click, rub or gallop Lungs: clear to auscultation bilaterally Abdomen: soft, non-tender; bowel sounds normal; no masses,  no organomegaly Extremities: extremities normal, atraumatic, no cyanosis or edema Neurologic:  intact  Diagnostic Studies & Laboratory data: Sodium 135 Potassium 4.2 Chloride 100 Bun 21 Cr 1.1 Hg/Hct 12.7 38.4 Platelets 269,000     Recent Radiology Findings:   No results found.   Recent Lab Findings: No results found for this basename: WBC, HGB, HCT, PLT, GLUCOSE, CHOL, TRIG, HDL, LDLDIRECT, LDLCALC, ALT, AST, NA, K, CL, CREATININE, BUN, CO2, TSH, INR, GLUF, HGBA1C    Assessment / Plan:   1. Multivessel CAD-He had an MI 7 years ago. He now has significant left main disease included. He last had Plavix this am. We will obtain a stat P2Y12 assay to determine platelet inhibition. He is scheduled for coronary artery bypass grafting surgery in the am with Dr. Donata ClayVan Trigt. 2. DM-check HGA1C 3.Hyperlipidemia-stating per cardiology 4. Hypertension-medications per cardiology 5. Carotid artery disease-last duplex he was told showed 50% bilateral internal carotid artery disease. Check duplex US today.  Doree FudgeDonielle Zimmerman PA-C 01/02/2014 1:41 PM

## 2014-01-03 ENCOUNTER — Encounter (HOSPITAL_COMMUNITY): Payer: Medicare PPO | Admitting: Anesthesiology

## 2014-01-03 ENCOUNTER — Inpatient Hospital Stay (HOSPITAL_COMMUNITY): Payer: Medicare PPO

## 2014-01-03 ENCOUNTER — Encounter (HOSPITAL_COMMUNITY): Payer: Self-pay | Admitting: Anesthesiology

## 2014-01-03 ENCOUNTER — Inpatient Hospital Stay (HOSPITAL_COMMUNITY): Payer: Medicare PPO | Admitting: Anesthesiology

## 2014-01-03 ENCOUNTER — Encounter (HOSPITAL_COMMUNITY)
Admission: AD | Disposition: A | Discharge status: Home / Self Care | Payer: Medicare PPO | Source: Other Acute Inpatient Hospital | Attending: Cardiothoracic Surgery

## 2014-01-03 DIAGNOSIS — Z951 Presence of aortocoronary bypass graft: Secondary | ICD-10-CM

## 2014-01-03 DIAGNOSIS — I25810 Atherosclerosis of coronary artery bypass graft(s) without angina pectoris: Secondary | ICD-10-CM

## 2014-01-03 HISTORY — PX: CORONARY ARTERY BYPASS GRAFT: SHX141

## 2014-01-03 HISTORY — PX: TEE WITHOUT CARDIOVERSION: SHX5443

## 2014-01-03 LAB — CBC
HCT: 37.3 % — ABNORMAL LOW (ref 39.0–52.0)
HCT: 29.7 % — ABNORMAL LOW (ref 39.0–52.0)
HCT: 28.6 % — ABNORMAL LOW (ref 39.0–52.0)
HEMOGLOBIN: 9.9 g/dL — AB (ref 13.0–17.0)
Hemoglobin: 12.2 g/dL — ABNORMAL LOW (ref 13.0–17.0)
Hemoglobin: 9.4 g/dL — ABNORMAL LOW (ref 13.0–17.0)
MCH: 28.1 pg (ref 26.0–34.0)
MCH: 28 pg (ref 26.0–34.0)
MCH: 27.6 pg (ref 26.0–34.0)
MCHC: 32.7 g/dL (ref 30.0–36.0)
MCHC: 33.3 g/dL (ref 30.0–36.0)
MCHC: 32.9 g/dL (ref 30.0–36.0)
MCV: 85.9 fL (ref 78.0–100.0)
MCV: 84.1 fL (ref 78.0–100.0)
MCV: 83.9 fL (ref 78.0–100.0)
PLATELETS: 232 10*3/uL (ref 150–400)
Platelets: 196 10*3/uL (ref 150–400)
Platelets: 231 10*3/uL (ref 150–400)
RBC: 4.34 MIL/uL (ref 4.22–5.81)
RBC: 3.53 MIL/uL — AB (ref 4.22–5.81)
RBC: 3.41 MIL/uL — ABNORMAL LOW (ref 4.22–5.81)
RDW: 14.1 % (ref 11.5–15.5)
RDW: 14 % (ref 11.5–15.5)
RDW: 14 % (ref 11.5–15.5)
WBC: 7.4 10*3/uL (ref 4.0–10.5)
WBC: 12.7 10*3/uL — ABNORMAL HIGH (ref 4.0–10.5)
WBC: 11.7 10*3/uL — ABNORMAL HIGH (ref 4.0–10.5)

## 2014-01-03 LAB — LIPID PANEL
Cholesterol: 206 mg/dL — ABNORMAL HIGH (ref 0–200)
HDL: 43 mg/dL (ref >39)
LDL Cholesterol: 93 mg/dL (ref 0–99)
Total CHOL/HDL Ratio: 4.8 RATIO
Triglycerides: 350 mg/dL — ABNORMAL HIGH (ref <150)
VLDL: 70 mg/dL — ABNORMAL HIGH (ref 0–40)

## 2014-01-03 LAB — POCT I-STAT, CHEM 8
BUN: 15 mg/dL (ref 6–23)
BUN: 13 mg/dL (ref 6–23)
BUN: 14 mg/dL (ref 6–23)
BUN: 12 mg/dL (ref 6–23)
BUN: 12 mg/dL (ref 6–23)
BUN: 18 mg/dL (ref 6–23)
Calcium, Ion: 1.25 mmol/L (ref 1.13–1.30)
Calcium, Ion: 1.1 mmol/L — ABNORMAL LOW (ref 1.13–1.30)
Calcium, Ion: 1.21 mmol/L (ref 1.13–1.30)
Calcium, Ion: 1.09 mmol/L — ABNORMAL LOW (ref 1.13–1.30)
Calcium, Ion: 1.26 mmol/L (ref 1.13–1.30)
Calcium, Ion: 1.13 mmol/L (ref 1.13–1.30)
Chloride: 103 mEq/L (ref 96–112)
Chloride: 103 mEq/L (ref 96–112)
Chloride: 96 mEq/L (ref 96–112)
Chloride: 100 mEq/L (ref 96–112)
Chloride: 101 mEq/L (ref 96–112)
Chloride: 110 mEq/L (ref 96–112)
Creatinine, Ser: 0.7 mg/dL (ref 0.50–1.35)
Creatinine, Ser: 0.6 mg/dL (ref 0.50–1.35)
Creatinine, Ser: 0.7 mg/dL (ref 0.50–1.35)
Creatinine, Ser: 0.6 mg/dL (ref 0.50–1.35)
Creatinine, Ser: 0.7 mg/dL (ref 0.50–1.35)
Creatinine, Ser: 0.9 mg/dL (ref 0.50–1.35)
Glucose, Bld: 180 mg/dL — ABNORMAL HIGH (ref 70–99)
Glucose, Bld: 142 mg/dL — ABNORMAL HIGH (ref 70–99)
Glucose, Bld: 160 mg/dL — ABNORMAL HIGH (ref 70–99)
Glucose, Bld: 172 mg/dL — ABNORMAL HIGH (ref 70–99)
Glucose, Bld: 199 mg/dL — ABNORMAL HIGH (ref 70–99)
Glucose, Bld: 109 mg/dL — ABNORMAL HIGH (ref 70–99)
HCT: 35 % — ABNORMAL LOW (ref 39.0–52.0)
HCT: 26 % — ABNORMAL LOW (ref 39.0–52.0)
HCT: 31 % — ABNORMAL LOW (ref 39.0–52.0)
HCT: 26 % — ABNORMAL LOW (ref 39.0–52.0)
HCT: 27 % — ABNORMAL LOW (ref 39.0–52.0)
HCT: 28 % — ABNORMAL LOW (ref 39.0–52.0)
Hemoglobin: 11.9 g/dL — ABNORMAL LOW (ref 13.0–17.0)
Hemoglobin: 10.5 g/dL — ABNORMAL LOW (ref 13.0–17.0)
Hemoglobin: 8.8 g/dL — ABNORMAL LOW (ref 13.0–17.0)
Hemoglobin: 8.8 g/dL — ABNORMAL LOW (ref 13.0–17.0)
Hemoglobin: 9.2 g/dL — ABNORMAL LOW (ref 13.0–17.0)
Hemoglobin: 9.5 g/dL — ABNORMAL LOW (ref 13.0–17.0)
Potassium: 4.3 mEq/L (ref 3.7–5.3)
Potassium: 4.4 mEq/L (ref 3.7–5.3)
Potassium: 4.5 mEq/L (ref 3.7–5.3)
Potassium: 4 mEq/L (ref 3.7–5.3)
Potassium: 3.8 mEq/L (ref 3.7–5.3)
Potassium: 6.7 mEq/L (ref 3.7–5.3)
Sodium: 139 mEq/L (ref 137–147)
Sodium: 135 mEq/L — ABNORMAL LOW (ref 137–147)
Sodium: 137 mEq/L (ref 137–147)
Sodium: 138 mEq/L (ref 137–147)
Sodium: 138 mEq/L (ref 137–147)
Sodium: 133 mEq/L — ABNORMAL LOW (ref 137–147)
TCO2: 27 mmol/L (ref 0–100)
TCO2: 26 mmol/L (ref 0–100)
TCO2: 27 mmol/L (ref 0–100)
TCO2: 24 mmol/L (ref 0–100)
TCO2: 22 mmol/L (ref 0–100)
TCO2: 30 mmol/L (ref 0–100)

## 2014-01-03 LAB — POCT I-STAT 3, ART BLOOD GAS (G3+)
Acid-Base Excess: 3 mmol/L — ABNORMAL HIGH (ref 0.0–2.0)
Acid-Base Excess: 2 mmol/L (ref 0.0–2.0)
Acid-Base Excess: 2 mmol/L (ref 0.0–2.0)
Acid-base deficit: 1 mmol/L (ref 0.0–2.0)
Bicarbonate: 28.7 mEq/L — ABNORMAL HIGH (ref 20.0–24.0)
Bicarbonate: 24.7 mEq/L — ABNORMAL HIGH (ref 20.0–24.0)
Bicarbonate: 27.5 mEq/L — ABNORMAL HIGH (ref 20.0–24.0)
Bicarbonate: 27 mEq/L — ABNORMAL HIGH (ref 20.0–24.0)
Bicarbonate: 28.5 mEq/L — ABNORMAL HIGH (ref 20.0–24.0)
O2 Saturation: 100 %
O2 Saturation: 87 %
O2 Saturation: 99 %
O2 Saturation: 92 %
O2 Saturation: 95 %
Patient temperature: 37.1
Patient temperature: 37.3
TCO2: 30 mmol/L (ref 0–100)
TCO2: 26 mmol/L (ref 0–100)
TCO2: 29 mmol/L (ref 0–100)
TCO2: 29 mmol/L (ref 0–100)
TCO2: 30 mmol/L (ref 0–100)
pCO2 arterial: 51.8 mmHg — ABNORMAL HIGH (ref 35.0–45.0)
pCO2 arterial: 44.8 mmHg (ref 35.0–45.0)
pCO2 arterial: 47.3 mmHg — ABNORMAL HIGH (ref 35.0–45.0)
pCO2 arterial: 53.3 mmHg — ABNORMAL HIGH (ref 35.0–45.0)
pCO2 arterial: 52.4 mmHg — ABNORMAL HIGH (ref 35.0–45.0)
pH, Arterial: 7.351 (ref 7.350–7.450)
pH, Arterial: 7.349 — ABNORMAL LOW (ref 7.350–7.450)
pH, Arterial: 7.372 (ref 7.350–7.450)
pH, Arterial: 7.313 — ABNORMAL LOW (ref 7.350–7.450)
pH, Arterial: 7.344 — ABNORMAL LOW (ref 7.350–7.450)
pO2, Arterial: 315 mmHg — ABNORMAL HIGH (ref 80.0–100.0)
pO2, Arterial: 55 mmHg — ABNORMAL LOW (ref 80.0–100.0)
pO2, Arterial: 164 mmHg — ABNORMAL HIGH (ref 80.0–100.0)
pO2, Arterial: 70 mmHg — ABNORMAL LOW (ref 80.0–100.0)
pO2, Arterial: 84 mmHg (ref 80.0–100.0)

## 2014-01-03 LAB — GLUCOSE, CAPILLARY
Glucose-Capillary: 151 mg/dL — ABNORMAL HIGH (ref 70–99)
Glucose-Capillary: 100 mg/dL — ABNORMAL HIGH (ref 70–99)

## 2014-01-03 LAB — HEMOGLOBIN AND HEMATOCRIT, BLOOD
HCT: 25.6 % — ABNORMAL LOW (ref 39.0–52.0)
Hemoglobin: 8.6 g/dL — ABNORMAL LOW (ref 13.0–17.0)

## 2014-01-03 LAB — HEMOGLOBIN A1C
Hgb A1c MFr Bld: 7.2 % — ABNORMAL HIGH (ref <5.7)
Mean Plasma Glucose: 160 mg/dL — ABNORMAL HIGH (ref <117)

## 2014-01-03 LAB — BLOOD GAS, ARTERIAL
Acid-Base Excess: 1.2 mmol/L (ref 0.0–2.0)
Bicarbonate: 26.4 mEq/L — ABNORMAL HIGH (ref 20.0–24.0)
Drawn by: 36496
FIO2: 0.28 %
O2 Saturation: 94.7 %
Patient temperature: 98.5
TCO2: 28 mmol/L (ref 0–100)
pCO2 arterial: 50.4 mmHg — ABNORMAL HIGH (ref 35.0–45.0)
pH, Arterial: 7.339 — ABNORMAL LOW (ref 7.350–7.450)
pO2, Arterial: 77.7 mmHg — ABNORMAL LOW (ref 80.0–100.0)

## 2014-01-03 LAB — POCT I-STAT GLUCOSE
Glucose, Bld: 162 mg/dL — ABNORMAL HIGH (ref 70–99)
Glucose, Bld: 164 mg/dL — ABNORMAL HIGH (ref 70–99)
Operator id: 173792
Operator id: 3402

## 2014-01-03 LAB — POCT I-STAT 4, (NA,K, GLUC, HGB,HCT)
Glucose, Bld: 128 mg/dL — ABNORMAL HIGH (ref 70–99)
HCT: 28 % — ABNORMAL LOW (ref 39.0–52.0)
Hemoglobin: 9.5 g/dL — ABNORMAL LOW (ref 13.0–17.0)
Potassium: 3.7 mEq/L (ref 3.7–5.3)
Sodium: 140 mEq/L (ref 137–147)

## 2014-01-03 LAB — BASIC METABOLIC PANEL
ANION GAP: 11 (ref 5–15)
BUN: 17 mg/dL (ref 6–23)
CO2: 28 mEq/L (ref 19–32)
Calcium: 9 mg/dL (ref 8.4–10.5)
Chloride: 103 mEq/L (ref 96–112)
Creatinine, Ser: 0.91 mg/dL (ref 0.50–1.35)
GFR calc Af Amer: >90 mL/min (ref >90)
GFR, EST NON AFRICAN AMERICAN: 87 mL/min — AB (ref >90)
Glucose, Bld: 190 mg/dL — ABNORMAL HIGH (ref 70–99)
Potassium: 4.4 mEq/L (ref 3.7–5.3)
SODIUM: 142 meq/L (ref 137–147)

## 2014-01-03 LAB — HEPARIN LEVEL (UNFRACTIONATED): Heparin Unfractionated: 0.34 IU/mL (ref 0.30–0.70)

## 2014-01-03 LAB — CREATININE, SERUM
Creatinine, Ser: 0.85 mg/dL (ref 0.50–1.35)
GFR calc Af Amer: >90 mL/min (ref >90)
GFR calc non Af Amer: 89 mL/min — ABNORMAL LOW (ref >90)

## 2014-01-03 LAB — PLATELET COUNT: Platelets: 221 10*3/uL (ref 150–400)

## 2014-01-03 LAB — PROTIME-INR
INR: 1.3 (ref 0.00–1.49)
Prothrombin Time: 16.3 seconds — ABNORMAL HIGH (ref 11.6–15.2)

## 2014-01-03 LAB — PLATELET INHIBITION P2Y12: Platelet Function  P2Y12: 272 [PRU] (ref 194–418)

## 2014-01-03 LAB — MAGNESIUM: Magnesium: 2.3 mg/dL (ref 1.5–2.5)

## 2014-01-03 LAB — APTT: aPTT: 30 seconds (ref 24–37)

## 2014-01-03 SURGERY — CORONARY ARTERY BYPASS GRAFTING (CABG)
Anesthesia: General | Site: Chest

## 2014-01-03 MED ORDER — OXYCODONE HCL 5 MG PO TABS
5.0000 mg | ORAL_TABLET | ORAL | Status: DC | PRN
  Administered 2014-01-04 – 2014-01-08 (×18): 10 mg via ORAL
  Filled 2014-01-03 (×18): qty 2

## 2014-01-03 MED ORDER — HEMOSTATIC AGENTS (NO CHARGE) OPTIME
TOPICAL | Status: DC | PRN
  Administered 2014-01-03: 1 via TOPICAL

## 2014-01-03 MED ORDER — DEXTROSE 5 % IV SOLN
0.0000 ug/min | INTRAVENOUS | Status: DC
  Filled 2014-01-03: qty 2

## 2014-01-03 MED ORDER — PLASMA-LYTE 148 IV SOLN
INTRAVENOUS | Status: DC | PRN
  Administered 2014-01-03: 10:00:00 via INTRAVASCULAR

## 2014-01-03 MED ORDER — SODIUM CHLORIDE 0.9 % IV SOLN
INTRAVENOUS | Status: DC
  Administered 2014-01-03: 4.4 [IU]/h via INTRAVENOUS
  Filled 2014-01-03 (×2): qty 2.5

## 2014-01-03 MED ORDER — PANTOPRAZOLE SODIUM 40 MG PO TBEC
40.0000 mg | DELAYED_RELEASE_TABLET | Freq: Every day | ORAL | Status: DC
  Administered 2014-01-05 – 2014-01-08 (×4): 40 mg via ORAL
  Filled 2014-01-03 (×4): qty 1

## 2014-01-03 MED ORDER — NITROGLYCERIN IN D5W 200-5 MCG/ML-% IV SOLN: 0.0000 ug/min | INTRAVENOUS | Status: DC

## 2014-01-03 MED ORDER — ROCURONIUM BROMIDE 50 MG/5ML IV SOLN
INTRAVENOUS | Status: AC
  Filled 2014-01-03: qty 1

## 2014-01-03 MED ORDER — FENTANYL CITRATE 0.05 MG/ML IJ SOLN
INTRAMUSCULAR | Status: AC
  Filled 2014-01-03: qty 5

## 2014-01-03 MED ORDER — FAMOTIDINE IN NACL 20-0.9 MG/50ML-% IV SOLN
20.0000 mg | Freq: Two times a day (BID) | INTRAVENOUS | Status: DC
  Administered 2014-01-03: 20 mg via INTRAVENOUS

## 2014-01-03 MED ORDER — ROCURONIUM BROMIDE 50 MG/5ML IV SOLN
INTRAVENOUS | Status: AC
  Filled 2014-01-03: qty 2

## 2014-01-03 MED ORDER — ALBUMIN HUMAN 5 % IV SOLN
INTRAVENOUS | Status: DC | PRN
  Administered 2014-01-03: 14:00:00 via INTRAVENOUS

## 2014-01-03 MED ORDER — HEPARIN SODIUM (PORCINE) 1000 UNIT/ML IJ SOLN
INTRAMUSCULAR | Status: AC
  Filled 2014-01-03: qty 1

## 2014-01-03 MED ORDER — LACTATED RINGERS IV SOLN: 500.0000 mL | Freq: Once | INTRAVENOUS | Status: DC | PRN

## 2014-01-03 MED ORDER — ACETAMINOPHEN 160 MG/5ML PO SOLN: 650.0000 mg | Freq: Once | ORAL | Status: AC

## 2014-01-03 MED ORDER — MIDAZOLAM HCL 2 MG/2ML IJ SOLN: 2.0000 mg | INTRAMUSCULAR | Status: DC | PRN

## 2014-01-03 MED ORDER — SODIUM CHLORIDE 0.9 % IV SOLN: INTRAVENOUS | Status: DC

## 2014-01-03 MED ORDER — 0.9 % SODIUM CHLORIDE (POUR BTL) OPTIME
TOPICAL | Status: DC | PRN
  Administered 2014-01-03: 6000 mL

## 2014-01-03 MED ORDER — MIDAZOLAM HCL 5 MG/5ML IJ SOLN
INTRAMUSCULAR | Status: DC | PRN
  Administered 2014-01-03: 3 mg via INTRAVENOUS
  Administered 2014-01-03: 2 mg via INTRAVENOUS
  Administered 2014-01-03: 5 mg via INTRAVENOUS
  Administered 2014-01-03: 2 mg via INTRAVENOUS

## 2014-01-03 MED ORDER — PHENYLEPHRINE HCL 10 MG/ML IJ SOLN
INTRAMUSCULAR | Status: AC
  Filled 2014-01-03: qty 2

## 2014-01-03 MED ORDER — SODIUM CHLORIDE 0.9 % IJ SOLN
3.0000 mL | Freq: Two times a day (BID) | INTRAMUSCULAR | Status: DC
  Administered 2014-01-04 – 2014-01-07 (×5): 3 mL via INTRAVENOUS

## 2014-01-03 MED ORDER — MORPHINE SULFATE 2 MG/ML IJ SOLN
2.0000 mg | INTRAMUSCULAR | Status: DC | PRN
  Administered 2014-01-03 (×2): 4 mg via INTRAVENOUS
  Administered 2014-01-04: 2 mg via INTRAVENOUS
  Administered 2014-01-04 (×2): 4 mg via INTRAVENOUS
  Filled 2014-01-03: qty 1
  Filled 2014-01-03: qty 2
  Filled 2014-01-03: qty 1
  Filled 2014-01-03 (×2): qty 2
  Filled 2014-01-03: qty 1
  Filled 2014-01-03: qty 2

## 2014-01-03 MED ORDER — MAGNESIUM SULFATE 4000MG/100ML IJ SOLN
4.0000 g | Freq: Once | INTRAMUSCULAR | Status: AC
  Administered 2014-01-03: 4 g via INTRAVENOUS
  Filled 2014-01-03: qty 100

## 2014-01-03 MED ORDER — ONDANSETRON HCL 4 MG/2ML IJ SOLN: 4.0000 mg | Freq: Four times a day (QID) | INTRAMUSCULAR | Status: DC | PRN

## 2014-01-03 MED ORDER — CALCIUM CHLORIDE 10 % IV SOLN
INTRAVENOUS | Status: DC | PRN
  Administered 2014-01-03 (×3): 200 mg via INTRAVENOUS

## 2014-01-03 MED ORDER — METOCLOPRAMIDE HCL 5 MG/ML IJ SOLN
10.0000 mg | Freq: Four times a day (QID) | INTRAMUSCULAR | Status: AC
  Administered 2014-01-04 (×5): 10 mg via INTRAVENOUS
  Filled 2014-01-03 (×5): qty 2

## 2014-01-03 MED ORDER — SODIUM CHLORIDE 0.45 % IV SOLN
INTRAVENOUS | Status: DC
  Administered 2014-01-03: 15:00:00 via INTRAVENOUS

## 2014-01-03 MED ORDER — SODIUM CHLORIDE 0.9 % IJ SOLN
OROMUCOSAL | Status: DC | PRN
  Administered 2014-01-03 (×3): via TOPICAL

## 2014-01-03 MED ORDER — LACTATED RINGERS IV SOLN
INTRAVENOUS | Status: DC | PRN
  Administered 2014-01-03: 07:00:00 via INTRAVENOUS

## 2014-01-03 MED ORDER — ACETAMINOPHEN 160 MG/5ML PO SOLN
1000.0000 mg | Freq: Four times a day (QID) | ORAL | Status: DC
  Filled 2014-01-03: qty 40

## 2014-01-03 MED ORDER — ALBUMIN HUMAN 5 % IV SOLN
250.0000 mL | INTRAVENOUS | Status: AC | PRN
  Administered 2014-01-03: 250 mL via INTRAVENOUS

## 2014-01-03 MED ORDER — DOPAMINE-DEXTROSE 3.2-5 MG/ML-% IV SOLN
2.5000 ug/kg/min | INTRAVENOUS | Status: DC
Start: 2014-01-03 — End: 2014-01-03

## 2014-01-03 MED ORDER — ROCURONIUM BROMIDE 100 MG/10ML IV SOLN
INTRAVENOUS | Status: DC | PRN
  Administered 2014-01-03 (×2): 50 mg via INTRAVENOUS
  Administered 2014-01-03: 60 mg via INTRAVENOUS
  Administered 2014-01-03: 50 mg via INTRAVENOUS
  Administered 2014-01-03: 40 mg via INTRAVENOUS

## 2014-01-03 MED ORDER — FENTANYL CITRATE 0.05 MG/ML IJ SOLN
INTRAMUSCULAR | Status: DC | PRN
  Administered 2014-01-03: 100 ug via INTRAVENOUS
  Administered 2014-01-03 (×2): 500 ug via INTRAVENOUS
  Administered 2014-01-03 (×2): 250 ug via INTRAVENOUS
  Administered 2014-01-03: 400 ug via INTRAVENOUS
  Administered 2014-01-03: 250 ug via INTRAVENOUS

## 2014-01-03 MED ORDER — MIDAZOLAM HCL 10 MG/2ML IJ SOLN
INTRAMUSCULAR | Status: AC
  Filled 2014-01-03: qty 2

## 2014-01-03 MED ORDER — HEPARIN SODIUM (PORCINE) 1000 UNIT/ML IJ SOLN
INTRAMUSCULAR | Status: DC | PRN
  Administered 2014-01-03: 5000 [IU] via INTRAVENOUS
  Administered 2014-01-03: 30000 [IU] via INTRAVENOUS

## 2014-01-03 MED ORDER — CHLORHEXIDINE GLUCONATE 0.12 % MT SOLN
15.0000 mL | Freq: Two times a day (BID) | OROMUCOSAL | Status: DC
  Administered 2014-01-03 – 2014-01-04 (×2): 15 mL via OROMUCOSAL
  Filled 2014-01-03: qty 15

## 2014-01-03 MED ORDER — PROTAMINE SULFATE 10 MG/ML IV SOLN
INTRAVENOUS | Status: AC
  Filled 2014-01-03: qty 25

## 2014-01-03 MED ORDER — METOPROLOL TARTRATE 25 MG/10 ML ORAL SUSPENSION
12.5000 mg | Freq: Two times a day (BID) | ORAL | Status: DC
  Filled 2014-01-03 (×9): qty 5

## 2014-01-03 MED ORDER — PHENYLEPHRINE HCL 10 MG/ML IJ SOLN
INTRAMUSCULAR | Status: AC
  Filled 2014-01-03: qty 1

## 2014-01-03 MED ORDER — CALCIUM CHLORIDE 10 % IV SOLN
INTRAVENOUS | Status: AC
  Filled 2014-01-03: qty 10

## 2014-01-03 MED ORDER — DEXTROSE 5 % IV SOLN
1.5000 g | Freq: Two times a day (BID) | INTRAVENOUS | Status: AC
  Administered 2014-01-04 – 2014-01-05 (×3): 1.5 g via INTRAVENOUS
  Filled 2014-01-03 (×4): qty 1.5

## 2014-01-03 MED ORDER — VANCOMYCIN HCL IN DEXTROSE 1-5 GM/200ML-% IV SOLN
1000.0000 mg | Freq: Once | INTRAVENOUS | Status: AC
  Administered 2014-01-03: 1000 mg via INTRAVENOUS
  Filled 2014-01-03: qty 200

## 2014-01-03 MED ORDER — POTASSIUM CHLORIDE 10 MEQ/50ML IV SOLN
10.0000 meq | INTRAVENOUS | Status: AC
  Administered 2014-01-03 (×3): 10 meq via INTRAVENOUS

## 2014-01-03 MED ORDER — MIDAZOLAM HCL 2 MG/2ML IJ SOLN
INTRAMUSCULAR | Status: AC
  Filled 2014-01-03: qty 2

## 2014-01-03 MED ORDER — SUCCINYLCHOLINE CHLORIDE 20 MG/ML IJ SOLN
INTRAMUSCULAR | Status: AC
  Filled 2014-01-03: qty 1

## 2014-01-03 MED ORDER — ASPIRIN 81 MG PO CHEW: 324.0000 mg | CHEWABLE_TABLET | Freq: Every day | ORAL | Status: DC

## 2014-01-03 MED ORDER — ASPIRIN EC 325 MG PO TBEC
325.0000 mg | DELAYED_RELEASE_TABLET | Freq: Every day | ORAL | Status: DC
  Administered 2014-01-04 – 2014-01-08 (×5): 325 mg via ORAL
  Filled 2014-01-03 (×5): qty 1

## 2014-01-03 MED ORDER — LACTATED RINGERS IV SOLN
INTRAVENOUS | Status: DC | PRN
  Administered 2014-01-03: 08:00:00 via INTRAVENOUS

## 2014-01-03 MED ORDER — DOPAMINE-DEXTROSE 3.2-5 MG/ML-% IV SOLN: 3.0000 ug/kg/min | INTRAVENOUS | Status: DC

## 2014-01-03 MED ORDER — SODIUM CHLORIDE 0.9 % IV SOLN: 250.0000 mL | INTRAVENOUS | Status: DC

## 2014-01-03 MED ORDER — FENTANYL CITRATE 0.05 MG/ML IJ SOLN
INTRAMUSCULAR | Status: AC
Start: 2014-01-03 — End: 2014-01-03
  Filled 2014-01-03: qty 5

## 2014-01-03 MED ORDER — PROTAMINE SULFATE 10 MG/ML IV SOLN
INTRAVENOUS | Status: DC | PRN
  Administered 2014-01-03: 300 mg via INTRAVENOUS

## 2014-01-03 MED ORDER — LIDOCAINE HCL (CARDIAC) 20 MG/ML IV SOLN
INTRAVENOUS | Status: DC | PRN
Start: 2014-01-03 — End: 2014-01-03

## 2014-01-03 MED ORDER — DOCUSATE SODIUM 100 MG PO CAPS
200.0000 mg | ORAL_CAPSULE | Freq: Every day | ORAL | Status: DC
  Administered 2014-01-04 – 2014-01-08 (×3): 200 mg via ORAL
  Filled 2014-01-03 (×4): qty 2

## 2014-01-03 MED ORDER — LACTATED RINGERS IV SOLN: INTRAVENOUS | Status: DC

## 2014-01-03 MED ORDER — PROPOFOL 10 MG/ML IV BOLUS
INTRAVENOUS | Status: DC | PRN
  Administered 2014-01-03: 150 mg via INTRAVENOUS

## 2014-01-03 MED ORDER — LIDOCAINE HCL (CARDIAC) 20 MG/ML IV SOLN
INTRAVENOUS | Status: AC
  Filled 2014-01-03: qty 5

## 2014-01-03 MED ORDER — DEXMEDETOMIDINE HCL IN NACL 200 MCG/50ML IV SOLN: 0.0000 ug/kg/h | INTRAVENOUS | Status: DC

## 2014-01-03 MED ORDER — ACETAMINOPHEN 650 MG RE SUPP
650.0000 mg | Freq: Once | RECTAL | Status: AC
  Administered 2014-01-03: 650 mg via RECTAL

## 2014-01-03 MED ORDER — POTASSIUM CHLORIDE 10 MEQ/50ML IV SOLN
10.0000 meq | INTRAVENOUS | Status: DC | PRN
  Administered 2014-01-03: 10 meq via INTRAVENOUS

## 2014-01-03 MED ORDER — SODIUM CHLORIDE 0.9 % IJ SOLN: 3.0000 mL | INTRAMUSCULAR | Status: DC | PRN

## 2014-01-03 MED ORDER — ONDANSETRON HCL 4 MG/2ML IJ SOLN
INTRAMUSCULAR | Status: AC
  Filled 2014-01-03: qty 2

## 2014-01-03 MED ORDER — TRAMADOL HCL 50 MG PO TABS
50.0000 mg | ORAL_TABLET | ORAL | Status: DC | PRN
  Administered 2014-01-04: 100 mg via ORAL
  Filled 2014-01-03: qty 2

## 2014-01-03 MED ORDER — MORPHINE SULFATE 2 MG/ML IJ SOLN
1.0000 mg | INTRAMUSCULAR | Status: AC | PRN
  Administered 2014-01-03 (×2): 2 mg via INTRAVENOUS
  Filled 2014-01-03 (×2): qty 2

## 2014-01-03 MED ORDER — BISACODYL 5 MG PO TBEC
10.0000 mg | DELAYED_RELEASE_TABLET | Freq: Every day | ORAL | Status: DC
  Administered 2014-01-04 – 2014-01-08 (×3): 10 mg via ORAL
  Filled 2014-01-03 (×3): qty 2

## 2014-01-03 MED ORDER — CETYLPYRIDINIUM CHLORIDE 0.05 % MT LIQD
7.0000 mL | Freq: Four times a day (QID) | OROMUCOSAL | Status: DC
  Administered 2014-01-04 (×4): 7 mL via OROMUCOSAL

## 2014-01-03 MED ORDER — INSULIN REGULAR BOLUS VIA INFUSION
0.0000 [IU] | Freq: Three times a day (TID) | INTRAVENOUS | Status: DC
  Filled 2014-01-03: qty 10

## 2014-01-03 MED ORDER — PROPOFOL 10 MG/ML IV BOLUS
INTRAVENOUS | Status: AC
  Filled 2014-01-03: qty 20

## 2014-01-03 MED ORDER — METOPROLOL TARTRATE 12.5 MG HALF TABLET
12.5000 mg | ORAL_TABLET | Freq: Two times a day (BID) | ORAL | Status: DC
  Administered 2014-01-04 – 2014-01-06 (×6): 12.5 mg via ORAL
  Filled 2014-01-03 (×9): qty 1

## 2014-01-03 MED ORDER — SODIUM CHLORIDE 0.9 % IJ SOLN
INTRAMUSCULAR | Status: AC
  Filled 2014-01-03: qty 20

## 2014-01-03 MED ORDER — ARTIFICIAL TEARS OP OINT
TOPICAL_OINTMENT | OPHTHALMIC | Status: DC | PRN
  Administered 2014-01-03: 1 via OPHTHALMIC

## 2014-01-03 MED ORDER — DIPHENHYDRAMINE HCL 50 MG/ML IJ SOLN
INTRAMUSCULAR | Status: DC | PRN
  Administered 2014-01-03: 50 mg via INTRAVENOUS

## 2014-01-03 MED ORDER — SUCCINYLCHOLINE CHLORIDE 20 MG/ML IJ SOLN
INTRAMUSCULAR | Status: DC | PRN
  Administered 2014-01-03: 160 mg via INTRAVENOUS

## 2014-01-03 MED ORDER — BISACODYL 10 MG RE SUPP
10.0000 mg | Freq: Every day | RECTAL | Status: DC
Start: 2014-01-04 — End: 2014-01-08

## 2014-01-03 MED ORDER — ACETAMINOPHEN 500 MG PO TABS
1000.0000 mg | ORAL_TABLET | Freq: Four times a day (QID) | ORAL | Status: DC
  Administered 2014-01-04 – 2014-01-07 (×13): 1000 mg via ORAL
  Filled 2014-01-03 (×20): qty 2

## 2014-01-03 MED ORDER — METOPROLOL TARTRATE 1 MG/ML IV SOLN: 2.5000 mg | INTRAVENOUS | Status: DC | PRN

## 2014-01-03 SURGICAL SUPPLY — 108 items
ADAPTER CARDIO PERF ANTE/RETRO (ADAPTER) ×4 IMPLANT
ATTRACTOMAT 16X20 MAGNETIC DRP (DRAPES) ×4 IMPLANT
BAG DECANTER FOR FLEXI CONT (MISCELLANEOUS) ×4 IMPLANT
BANDAGE ELASTIC 4 VELCRO ST LF (GAUZE/BANDAGES/DRESSINGS) ×4 IMPLANT
BANDAGE ELASTIC 6 VELCRO ST LF (GAUZE/BANDAGES/DRESSINGS) ×4 IMPLANT
BASKET HEART  (ORDER IN 25'S) (MISCELLANEOUS) ×1
BASKET HEART (ORDER IN 25'S) (MISCELLANEOUS) ×1
BASKET HEART (ORDER IN 25S) (MISCELLANEOUS) ×2 IMPLANT
BENZOIN TINCTURE PRP APPL 2/3 (GAUZE/BANDAGES/DRESSINGS) ×4 IMPLANT
BLADE STERNUM SYSTEM 6 (BLADE) ×4 IMPLANT
BLADE SURG 11 STRL SS (BLADE) ×4 IMPLANT
BLADE SURG 12 STRL SS (BLADE) ×4 IMPLANT
BLADE SURG ROTATE 9660 (MISCELLANEOUS) IMPLANT
BNDG GAUZE ELAST 4 BULKY (GAUZE/BANDAGES/DRESSINGS) ×4 IMPLANT
CANISTER SUCTION 2500CC (MISCELLANEOUS) ×4 IMPLANT
CANNULA GUNDRY RCSP 15FR (MISCELLANEOUS) ×8 IMPLANT
CANNULA VENOUS LOW PROF 32X40 (CANNULA) IMPLANT
CARDIAC SUCTION (MISCELLANEOUS) ×4 IMPLANT
CATH CPB KIT VANTRIGT (MISCELLANEOUS) ×4 IMPLANT
CATH ROBINSON RED A/P 18FR (CATHETERS) ×12 IMPLANT
CATH THORACIC 36FR RT ANG (CATHETERS) ×4 IMPLANT
CLIP FOGARTY SPRING 6M (CLIP) ×4 IMPLANT
CLIP TI WIDE RED SMALL 24 (CLIP) IMPLANT
CLOSURE STERI-STRIP 1/2X4 (GAUZE/BANDAGES/DRESSINGS) ×1
CLOSURE WOUND 1/2 X4 (GAUZE/BANDAGES/DRESSINGS) ×1
CLSR STERI-STRIP ANTIMIC 1/2X4 (GAUZE/BANDAGES/DRESSINGS) ×3 IMPLANT
COVER SURGICAL LIGHT HANDLE (MISCELLANEOUS) ×4 IMPLANT
CRADLE DONUT ADULT HEAD (MISCELLANEOUS) ×4 IMPLANT
DRAIN CHANNEL 32F RND 10.7 FF (WOUND CARE) ×4 IMPLANT
DRAPE CARDIOVASCULAR INCISE (DRAPES) ×2
DRAPE SLUSH/WARMER DISC (DRAPES) ×4 IMPLANT
DRAPE SRG 135X102X78XABS (DRAPES) ×2 IMPLANT
DRSG AQUACEL AG ADV 3.5X14 (GAUZE/BANDAGES/DRESSINGS) ×4 IMPLANT
DRSG COVADERM 4X14 (GAUZE/BANDAGES/DRESSINGS) ×4 IMPLANT
ELECT BLADE 4.0 EZ CLEAN MEGAD (MISCELLANEOUS) ×4
ELECT BLADE 6.5 EXT (BLADE) ×4 IMPLANT
ELECT CAUTERY BLADE 6.4 (BLADE) ×4 IMPLANT
ELECT REM PT RETURN 9FT ADLT (ELECTROSURGICAL) ×8
ELECTRODE BLDE 4.0 EZ CLN MEGD (MISCELLANEOUS) ×2 IMPLANT
ELECTRODE REM PT RTRN 9FT ADLT (ELECTROSURGICAL) ×4 IMPLANT
GAUZE SPONGE 4X4 12PLY STRL (GAUZE/BANDAGES/DRESSINGS) ×8 IMPLANT
GLOVE BIO SURGEON STRL SZ 6.5 (GLOVE) ×9 IMPLANT
GLOVE BIO SURGEON STRL SZ7.5 (GLOVE) ×12 IMPLANT
GLOVE BIO SURGEONS STRL SZ 6.5 (GLOVE) ×3
GLOVE BIOGEL PI IND STRL 7.0 (GLOVE) ×8 IMPLANT
GLOVE BIOGEL PI INDICATOR 7.0 (GLOVE) ×8
GOWN STRL REUS W/ TWL LRG LVL3 (GOWN DISPOSABLE) ×12 IMPLANT
GOWN STRL REUS W/TWL LRG LVL3 (GOWN DISPOSABLE) ×12
HEMOSTAT POWDER SURGIFOAM 1G (HEMOSTASIS) ×12 IMPLANT
HEMOSTAT SURGICEL 2X14 (HEMOSTASIS) ×4 IMPLANT
INSERT FOGARTY XLG (MISCELLANEOUS) IMPLANT
KIT BASIN OR (CUSTOM PROCEDURE TRAY) ×4 IMPLANT
KIT ROOM TURNOVER OR (KITS) ×4 IMPLANT
KIT SUCTION CATH 14FR (SUCTIONS) ×4 IMPLANT
KIT VASOVIEW W/TROCAR VH 2000 (KITS) ×4 IMPLANT
LEAD PACING MYOCARDI (MISCELLANEOUS) ×4 IMPLANT
MARKER GRAFT CORONARY BYPASS (MISCELLANEOUS) ×12 IMPLANT
NS IRRIG 1000ML POUR BTL (IV SOLUTION) ×24 IMPLANT
PACK OPEN HEART (CUSTOM PROCEDURE TRAY) ×4 IMPLANT
PAD ARMBOARD 7.5X6 YLW CONV (MISCELLANEOUS) ×8 IMPLANT
PAD ELECT DEFIB RADIOL ZOLL (MISCELLANEOUS) ×4 IMPLANT
PENCIL BUTTON HOLSTER BLD 10FT (ELECTRODE) ×4 IMPLANT
PUNCH AORTIC ROTATE 4.0MM (MISCELLANEOUS) IMPLANT
PUNCH AORTIC ROTATE 4.5MM 8IN (MISCELLANEOUS) ×4 IMPLANT
PUNCH AORTIC ROTATE 5MM 8IN (MISCELLANEOUS) IMPLANT
SET CARDIOPLEGIA MPS 5001102 (MISCELLANEOUS) ×4 IMPLANT
SPONGE GAUZE 4X4 12PLY STER LF (GAUZE/BANDAGES/DRESSINGS) ×8 IMPLANT
SPONGE LAP 18X18 X RAY DECT (DISPOSABLE) ×8 IMPLANT
SPONGE LAP 4X18 X RAY DECT (DISPOSABLE) ×4 IMPLANT
STRIP CLOSURE SKIN 1/2X4 (GAUZE/BANDAGES/DRESSINGS) ×3 IMPLANT
SURGIFLO TRUKIT (HEMOSTASIS) ×4 IMPLANT
SURGIFLO W/THROMBIN 8M KIT (HEMOSTASIS) ×4 IMPLANT
SUT BONE WAX W31G (SUTURE) ×4 IMPLANT
SUT MNCRL AB 4-0 PS2 18 (SUTURE) ×4 IMPLANT
SUT PROLENE 3 0 SH DA (SUTURE) IMPLANT
SUT PROLENE 3 0 SH1 36 (SUTURE) IMPLANT
SUT PROLENE 4 0 RB 1 (SUTURE) ×2
SUT PROLENE 4 0 SH DA (SUTURE) ×4 IMPLANT
SUT PROLENE 4-0 RB1 .5 CRCL 36 (SUTURE) ×2 IMPLANT
SUT PROLENE 5 0 C 1 36 (SUTURE) IMPLANT
SUT PROLENE 6 0 C 1 30 (SUTURE) ×16 IMPLANT
SUT PROLENE 6 0 CC (SUTURE) ×12 IMPLANT
SUT PROLENE 8 0 BV175 6 (SUTURE) ×16 IMPLANT
SUT PROLENE BLUE 7 0 (SUTURE) ×8 IMPLANT
SUT SILK  1 MH (SUTURE)
SUT SILK 1 MH (SUTURE) IMPLANT
SUT SILK 2 0 SH CR/8 (SUTURE) ×4 IMPLANT
SUT SILK 3 0 SH CR/8 (SUTURE) IMPLANT
SUT STEEL 6MS V (SUTURE) ×8 IMPLANT
SUT STEEL SZ 6 DBL 3X14 BALL (SUTURE) ×12 IMPLANT
SUT VIC AB 1 CTX 36 (SUTURE) ×12
SUT VIC AB 1 CTX36XBRD ANBCTR (SUTURE) ×12 IMPLANT
SUT VIC AB 2-0 CT1 27 (SUTURE)
SUT VIC AB 2-0 CT1 36 (SUTURE) ×4 IMPLANT
SUT VIC AB 2-0 CT1 TAPERPNT 27 (SUTURE) IMPLANT
SUT VIC AB 2-0 CTX 27 (SUTURE) IMPLANT
SUT VIC AB 3-0 X1 27 (SUTURE) IMPLANT
SUTURE E-PAK OPEN HEART (SUTURE) ×4 IMPLANT
SYSTEM SAHARA CHEST DRAIN ATS (WOUND CARE) ×4 IMPLANT
TAPE CLOTH SURG 4X10 WHT LF (GAUZE/BANDAGES/DRESSINGS) ×4 IMPLANT
TAPE PAPER 2X10 WHT MICROPORE (GAUZE/BANDAGES/DRESSINGS) ×4 IMPLANT
TOWEL OR 17X24 6PK STRL BLUE (TOWEL DISPOSABLE) ×8 IMPLANT
TOWEL OR 17X26 10 PK STRL BLUE (TOWEL DISPOSABLE) ×8 IMPLANT
TRAY FOLEY IC TEMP SENS 16FR (CATHETERS) ×4 IMPLANT
TUBING INSUFFLATION (TUBING) ×4 IMPLANT
UNDERPAD 30X30 INCONTINENT (UNDERPADS AND DIAPERS) ×4 IMPLANT
WATER STERILE IRR 1000ML POUR (IV SOLUTION) ×8 IMPLANT
YANKAUER SUCT BULB TIP NO VENT (SUCTIONS) ×4 IMPLANT

## 2014-01-03 NOTE — Anesthesia Preprocedure Evaluation (Addendum)
Anesthesia Evaluation  Patient identified by MRN, date of birth, ID band Patient awake    Reviewed: Allergy & Precautions, H&P , NPO status   History of Anesthesia Complications Negative for: history of anesthetic complications  Airway Mallampati: II      Dental  (+) Edentulous Lower, Edentulous Upper   Pulmonary neg pulmonary ROS, former smoker,    Pulmonary exam normal       Cardiovascular hypertension, Pt. on medications + angina with exertion + CAD, + Past MI and + Peripheral Vascular Disease Rhythm:Regular     Neuro/Psych negative neurological ROS  negative psych ROS   GI/Hepatic negative GI ROS, Neg liver ROS,   Endo/Other  diabetes, Type 2, Oral Hypoglycemic AgentsMorbid [phone]  Renal/GU      Musculoskeletal   Abdominal   Peds  Hematology negative hematology ROS (+)   Anesthesia Other Findings   Reproductive/Obstetrics                          Anesthesia Physical Anesthesia Plan  ASA: III  Anesthesia Plan: General   Post-op Pain Management:    Induction: Intravenous  Airway Management Planned: Oral ETT  Additional Equipment: Arterial line, CVP, PA Cath and TEE  Intra-op Plan: Utilization of Controlled Hypotension per surrgeon request  Post-operative Plan: Post-operative intubation/ventilation  Informed Consent: I have reviewed the patients History and Physical, chart, labs and discussed the procedure including the risks, benefits and alternatives for the proposed anesthesia with the patient or authorized representative who has indicated his/her understanding and acceptance.     Plan Discussed with: CRNA, Anesthesiologist and Surgeon  Anesthesia Plan Comments:         Anesthesia Quick Evaluation

## 2014-01-03 NOTE — Addendum Note (Signed)
Addendum created 01/03/14 1504 by Coralee Rudobert Rhema Boyett, CRNA   Modules edited: Anesthesia Medication Administration

## 2014-01-03 NOTE — Anesthesia Procedure Notes (Signed)
Procedure Name: Intubation Date/Time: 01/03/2014 8:00 AM Performed by: Coralee RudFLORES, Kayon Pre-anesthesia Checklist: Patient identified, Emergency Drugs available, Suction available and Patient being monitored Patient Re-evaluated:Patient Re-evaluated prior to inductionOxygen Delivery Method: Circle system utilized Preoxygenation: Pre-oxygenation with 100% oxygen Ventilation: Mask ventilation without difficulty and Oral airway inserted - appropriate to patient size Laryngoscope Size: Miller and 3 Grade View: Grade I Tube type: Oral Tube size: 8.5 mm Number of attempts: 1 Airway Equipment and Method: Stylet Placement Confirmation: ETT inserted through vocal cords under direct vision and positive ETCO2 Secured at: 23 cm Tube secured with: Tape Dental Injury: Teeth and Oropharynx as per pre-operative assessment

## 2014-01-03 NOTE — Progress Notes (Signed)
ANTICOAGULATION CONSULT NOTE - Follow Up Consult  Pharmacy Consult for Heparin  Indication: chest pain/ACS  Allergies  Allergen Reactions  . Kiwi Extract Nausea And Vomiting and Other (See Comments)    Throat bumps on throat     Patient Measurements: Height: 5\' 11"  (180.3 cm) Weight: 263 lb 0.1 oz (119.3 kg) IBW/kg (Calculated) : 75.3  Vital Signs: Temp: 98.5 F (36.9 C) (10/16 2320) Temp Source: Oral (10/16 2320) BP: 165/73 mmHg (10/16 2100) Pulse Rate: 74 (10/16 2100)  Labs:  Recent Labs  01/02/14 1930 01/02/14 2140  APTT 42*  --   LABPROT 14.2  --   INR 1.08  --   HEPARINUNFRC  --  0.17*    Assessment: [facility]. First HL is 0.17. Other labs as above. CABG today.   Goal of Therapy:  Heparin level 0.3-0.7 units/ml Monitor platelets by anticoagulation protocol: Yes   Plan:  -Increase heparin to 1600 units/hr -0800 HL -CABG this AM, so likely no further levels past the 0800 level -Monitor for bleeding  Abran DukeLedford, Meagon Duskin 01/03/2014,12:16 AM

## 2014-01-03 NOTE — Transfer of Care (Signed)
Immediate Anesthesia Transfer of Care Note  Patient: Jake Castro  Procedure(s) Performed: Procedure(s): CORONARY ARTERY BYPASS GRAFTING (CABG) x four, using left internal mammary and right leg greater saphenous vein harvested endoscopically (N/A) TRANSESOPHAGEAL ECHOCARDIOGRAM (TEE) (N/A)  Patient Location: SICU  Anesthesia Type:General  Level of Consciousness: Patient remains intubated per anesthesia plan  Airway & Oxygen Therapy: Patient remains intubated per anesthesia plan and Patient placed on Ventilator (see vital sign flow sheet for setting)  Post-op Assessment: Report given to PACU RN, Post -op Vital signs reviewed and stable and Patient moving all extremities  Post vital signs: Reviewed and stable  Complications: No apparent anesthesia complications

## 2014-01-03 NOTE — Anesthesia Postprocedure Evaluation (Signed)
  Anesthesia Post-op Note  Patient: Jake Castro  Procedure(s) Performed: Procedure(s): CORONARY ARTERY BYPASS GRAFTING (CABG) x four, using left internal mammary and right leg greater saphenous vein harvested endoscopically (N/A) TRANSESOPHAGEAL ECHOCARDIOGRAM (TEE) (N/A)  Patient Location: SICU  Anesthesia Type:General  Level of Consciousness: sedated and Patient remains intubated per anesthesia plan  Airway and Oxygen Therapy: Patient remains intubated per anesthesia plan and Patient placed on Ventilator (see vital sign flow sheet for setting)  Post-op Pain: none  Post-op Assessment: Post-op Vital signs reviewed, Patient's Cardiovascular Status Stable, Respiratory Function Stable, Patent Airway, No signs of Nausea or vomiting and Pain level controlled  Post-op Vital Signs: stable  Last Vitals:  Filed Vitals:   01/03/14 0446  BP: 155/77  Pulse: 85  Temp: 36.6 C  Resp: 25    Complications: No apparent anesthesia complications

## 2014-01-03 NOTE — Progress Notes (Signed)
The patient was examined and preop studies reviewed. There has been no change from the prior exam and the patient is ready for surgery.   Plan CABG on [name] today

## 2014-01-03 NOTE — OR Nursing (Signed)
13:15 - 1st call to SICU charge nurse 

## 2014-01-03 NOTE — Procedures (Signed)
Extubation Procedure Note  Patient Details:   Name: Jake Castro DOB: [birth year] MRN: [id number]   Airway Documentation:     Evaluation  O2 sats: stable throughout Complications: No apparent complications Patient did tolerate procedure well. Bilateral Breath Sounds: Clear;Diminished   Yes  Pt tolerated wean, VC 1.0L, NIF -25, positive for cuff leak, extubated to 5L [place]. No stridor or dyspnea noted after extubation. Pt achieved around Jasmine DJuel BuLucianne [id number]n NeighbKarie SchwalbBaylor Emergency Medical Center At AubreyDaphine DeutschWoodinvT J Samson CommEzequielBil541516-Harolyn Rutherf(360)Alen Ble heJasmine DJuel Lucianne Mu42ss23KentuckySc9616Jamison NeighbKarie SchwalbFilutowski Eye Institute Pa Dba Lake Mary Surgical CenterDaphine DeutschHinAthens Orthopedic Clinic Ambulatory Surgery Center EzequielBil838780-Harolyn Rutherf709-Alen Ble heJasmine DJuel BuScotty CourEncomLucianne Mu14ss64Kentuckyss9616Jamison NeighbKarie SchwalbEncompass Health Deaconess Hospital IncDaphine DeutschSouth GKaiser Fnd [phone] Harolyn Rutherf819-Alen Ble heJasmine DJuel BLucianne Mu37ss50Kentuckyco9616Jamison NeighbKarie SchwalbSanford Medical Center WheatonDaphine DeutschBlesEast BEzequielBil939331-Harolyn Rutherf90Alen Ble heJasmine DJuel BScoLucianne Mu58ss31Kentuckyy 9616Jamison NeighbKarie SchwalbCentral Delaware Endoscopy Unit LLCDaphine DeutschSunSouthern EndosEzequielBil567(703) Harolyn Rutherf(716)Alen Ble heJasmine DJueLucianne Mu41ss38KentuckyBu9616Jamison NeighbKarie SchwalbTristar Southern Hills Medical CenterDaphine DeutschCConnecticut Surgery Center LimitEzequielBil913(763)Harolyn Rutherf73Alen Ble heJasmine DLucianne Mu21ss51Kentuckyel9616Jamison NeighbKarie SchwalbWashington Health GreeneDaphine DeutschFishing CGrays Harbor Community HEzequielBil8804-Harolyn Rutherf424-Alen Ble heJasmine DLucianne Mu17ss33Kentuckyel9616Jamison NeighbKarie SchwalbChristus Ochsner St Patrick HospitalDaphine DeutschHorUrology Surgery CentEzequielBil939(204)Harolyn Rutherf202-Alen Ble heJasmLucianne Mu4ss70Kentuckye 9616Jamison NeighbKarie SchwalbProvidence Medical CenterDaphine D[phone]-Harolyn Rutherf667-Alen Ble heJasmine DJuel BuScotty CourUc San DiegoLucianne Mu10ss37Kentuckyea9616Jamison NeighbKarie SchwalbCincinnati Children'S Hospital Medical Center At Lindner CenterDaphine DeutschOcklaClaxton-Hepburn EzequielBil216(570)Harolyn Rutherf802-Alen Ble heJasmine DJuel BuScotty CourPoLucianne Mu58ss75Kentuckyar9616Jamison NeighbKarie SchwalbNovant Health Huntersville Outpatient Surgery CenterDaphine DeutschMonHighsmith-Rainey MemEzequielBil425434-Harolyn Rutherf720-Alen Ble heJasmine DJuel BuScLucianne Mu5ss33Kentuckyty9616Jamison NeighbKarie SchwalbHosp General Menonita - CayeyDaphine DeutschAu SMemorial [phone]-Harolyn Rutherf(440)Alen Ble heJasmine DJuel BuScotty CourSelectLucianne Mu18ss79Kentuckype9616Jamison NeighbKarie SchwalbBaypointe Behavioral HealthDaphine DeutschABon Secours-St Francis [id number] Harolyn Rutherf254-Alen Ble heJasmine DJuel BuScoLucianne Mu88ss48Kentuckyy 9616Jamison NeighbKarie SchwalbRose Medical CenterDaphine DeutschPittZuni Comprehensive CommunityEzequielBil716787-Harolyn Rutherf825-Alen Ble heJasmine DLucianne Mu73ss64Kentuckyel9616Jamison NeighbKarie SchwalbCataract And Laser Center Associates PcDaphine DeutschMNoEzequielBil430(971)Harolyn Rutherf336-Alen Ble heJasmine DLucianne Mu83ss82Kentuckyel9616Jamison NeighbKarie SchwalbLaser And Cataract Center Of Shreveport LLCDaphine DeutschPLake Mary SurgEzequielBil409619 Harolyn Rutherf(984)Alen Ble heJasmine Lucianne Mu82ss65Kentuckyue9616Jamison NeighbKarie SchwalbEndoscopy Consultants LLCDaphine DeutschCherCentral Utah Clinic EzequielBil539413-Harolyn Rutherf484-Alen Ble heJasmine DJLucianne Mu51ss63[place]yl 9616Jamison NeighbKarie SchwalbMemorial HospitalDaphine DeutschRock Elite SurEzequielBil731860-Harolyn Rutherf(708)Alen Ble heJasLucianne Mu43ss41Kentuckyne9616Jamison NeighbKarie SchwalbSunnyview Rehabilitation HospitalDaphine DeutschCarmel-by-theLos Angeles AmbulatoEzequielBil978(330)Harolyn Rutherf670-Alen Ble heJasmine DJuel BuScotty CourSouthwell AmbulatoryLucianne Mu31ss29Kentuckync9616Jamison NeighbKarie SchwalbEyesight Laser And Surgery CtrDaphine DeutschDavis Lanai CommEzequielBil([phone]-Harolyn Rutherf919-Alen Ble heJasmine DJuel BuScottyLucianne Mu51ss48Kentuckyou9616Jamison NeighbKarie SchwalbOro Valley HospitalDaphine DeutschLafaySundance HEzequielBil([phone]-Harolyn Rutherf61Alen Ble<MEASUREMENJuel BurLucianne MKentuckyu7Harolyn RuthAlen Ble heJasmine DJuel Lucianne Mu51ss44KentuckySc9616Jamison NeighbKarie SchwalbGrand Junction Va Medical CenterDaphine DeutschKealakGastroenterology Consultants Of [phone] Harolyn Rutherf(279)Alen Ble heJasmine DJuel BuScoLucianne Mu66ss24Kentuckyy 9616Jamison NeighbKarie SchwalbAdvanced Colon Care IncDaphine DeutschSeSanEzequielBil502(438)Harolyn Rutherf408-Alen Ble heJasmine DJLucianne Mu79ss32[place]uckyl 9616Jamison NeighbKarie SchwalbTallahassee Memorial HospitalDaphine DeutschEau ClBrown Memorial [phone] Harolyn Rutherf93Alen Ble heJasmine DJuLucianne Mu75ss70[place] B9616Jamison NeighbKarie SchwalbSt Joseph HospitalDaphine DeutschAlbemSelect Specialty Hospital-CEzequielBil934909-Harolyn Rutherf804-Alen Ble heJasmine DJuLucianne Mu23ss63[place] B9616Jamison NeighbKarie SchwalbStory County HospitalDaphine DeutschNorth St Mary RehabilitEzequielBil713808-Harolyn Rutherf423-Alen Ble heJasmine DLucianne Mu83ss73Kentuckyel9616Jamison NeighbKarie SchwalbMorgan Medical CenterDaphine DeutschArSurgicare [phone]-Harolyn Rutherf(704)Alen Ble heJasmineLucianne Mu37ss3KentuckyJu9616Jamison NeighbKarie SchwalbMidatlantic Gastronintestinal Center IiiDaphine DeutschWaConnecticut Eye SurgerEzequielBil([phone]-Harolyn Rutherf713-Alen Ble heJasmine DJuel BScotty CouLucianne Mu80ss94Kentuckynn9616Jamison NeighbKarie SchwalbAlliance Healthcare SystemDaphine DeutschEdgeEmory Johns EzequielBil250671-Harolyn Rutherf(973)Alen Ble heJasminLucianne Mu15ss96KentuckyDJ9616Jamison NeighbKarie SchwalbAcuity Specialty Hospital - Ohio Valley At BelmontDaphine DeutschClaEyesight Laser AEzequielBil831952-Harolyn Rutherf97Alen Ble heJasmine DJueLucianne Mu44ss28KentuckyBu9616Jamison NeighbKarie SchwalbAlta Bates Summit Med Ctr-Herrick CampusDaphine DeutschHurlburt FCarmel Specialty EzequielBil336816-Harolyn Rutherf828-Alen Ble heJasmine DJuel BuScotLucianne Mu41ss80[place] C9616Jamison NeighbKarie SchwalbFillmore Community Medical CenterDaphine DeutschRichEstes Park EzequielBil46343Harolyn Rutherf848-Alen Ble heJasmine DJuel Lucianne Mu44ss62KentuckySc9616Jamison NeighbKarie SchwalbGraham Regional Medical CenterDaphine DeutschLampBerkeley EndoscEzequielBil334548-Harolyn Rutherf(509)Alen Ble heJasmine DJuel BuScotty CouLucianne Mu9ss57Kentuckyav9616Jamison NeighbKarie SchwalbCentura Health-Avista Adventist HospitalDaphine DeutschFairmont GeEzequielBil661(475) Harolyn Rutherf(604)Alen Ble heJasmine DJuel BuScotty CourLucianne Mu8ss39Kentuckyco9616Jamison NeighbKarie SchwalbSmokey Point Behaivoral HospitalDaphine DeutschThurSt Joseph'S CEzequielBil56792Harolyn Rutherf838-Alen BleacheJa29sm9616Jamison NeighbKarie SchwalbGreenwood Regional Rehabilitation HospitalDaphine DeutschScTrinity Surgery Center LLC Dba Baycare SLucianne Mu61sKentuckys55B16Jamison NeighHarmony Surgery Center LLCDaphine DeutscWilson Digestive DiseEzeq5Harolyn Rutherf202-Alen Ble heLucianne Mu35ss46Kentuckysm9616Jamison NeighbKarie SchwalbNaval Hospital BremertonDaphine DeutschMaysvChildrens Hospital OEzequielBil(847269-Harolyn Rutherf928-Alen Ble heJasmine DJLucianne Mu85ss28[place]uckyl 9616Jamison NeighbKarie SchwalbHoly Redeemer Hospital & Medical CenterDaphine DeutschKanMedical City DEzequielBil(505914-Harolyn Rutherf30Alen Ble heJasmLucianne Mu4ss57Kentuckye 9616Jamison NeighbKarie SchwalbNovant Health Huntersville Outpatient Surgery CenterDaphine DeutschWhite SurgicEzequielBil716(684) Harolyn Rutherf301-Alen Ble heJasmine DJLucianne Mu69ss86Kentuckyl 9616Jamison NeighbKarie SchwalbSelect Specialty Hospital - Spectrum HealthDaphine DeutschPowhKindred HospitaEzequielBil([phone] Rutherf(780)Alen Ble heJasmine DJuel BuSLucianne Mu89ss20Kentuckytt9616Jamison NeighbKarie SchwalbBarlow Respiratory HospitalDaphine DeutschConNorth Shore EnEzequielBil([phone] Harolyn Rutherf41Alen Bleacherereck LeepanCenter For Digestive HealtRaiford NoblWGKentucky 

## 2014-01-03 NOTE — OR Nursing (Signed)
14:00 - 2nd call to SICU charge nurse 

## 2014-01-03 NOTE — Brief Op Note (Signed)
01/02/2014 - 01/03/2014  12:30 PM  PATIENT:  Jake Castro  63 y.o. male  PRE-OPERATIVE DIAGNOSIS:  CAD, left main disease  POST-OPERATIVE DIAGNOSIS:  CAD, left main disease  PROCEDURE:   CORONARY ARTERY BYPASS GRAFTING x 4 (LIMA-LAD, SVG-OM, SVG-Ramus, SVG-PD) ENDOSCOPIC VEIN HARVEST RIGHT LEG  SURGEON:  Kerin PernaPeter Van Trigt, MD  ASSISTANT: Coral CeoGina Sierria Bruney, PA-C  ANESTHESIA:   general  PATIENT CONDITION:  ICU - intubated and hemodynamically stable.  PRE-OPERATIVE WEIGHT: 118 kg

## 2014-01-04 ENCOUNTER — Inpatient Hospital Stay (HOSPITAL_COMMUNITY): Payer: Medicare PPO

## 2014-01-04 LAB — GLUCOSE, CAPILLARY
Glucose-Capillary: 100 mg/dL — ABNORMAL HIGH (ref 70–99)
Glucose-Capillary: 101 mg/dL — ABNORMAL HIGH (ref 70–99)
Glucose-Capillary: 103 mg/dL — ABNORMAL HIGH (ref 70–99)
Glucose-Capillary: 104 mg/dL — ABNORMAL HIGH (ref 70–99)
Glucose-Capillary: 104 mg/dL — ABNORMAL HIGH (ref 70–99)
Glucose-Capillary: 104 mg/dL — ABNORMAL HIGH (ref 70–99)
Glucose-Capillary: 105 mg/dL — ABNORMAL HIGH (ref 70–99)
Glucose-Capillary: 105 mg/dL — ABNORMAL HIGH (ref 70–99)
Glucose-Capillary: 108 mg/dL — ABNORMAL HIGH (ref 70–99)
Glucose-Capillary: 108 mg/dL — ABNORMAL HIGH (ref 70–99)
Glucose-Capillary: 115 mg/dL — ABNORMAL HIGH (ref 70–99)
Glucose-Capillary: 115 mg/dL — ABNORMAL HIGH (ref 70–99)
Glucose-Capillary: 117 mg/dL — ABNORMAL HIGH (ref 70–99)
Glucose-Capillary: 119 mg/dL — ABNORMAL HIGH (ref 70–99)
Glucose-Capillary: 120 mg/dL — ABNORMAL HIGH (ref 70–99)
Glucose-Capillary: 123 mg/dL — ABNORMAL HIGH (ref 70–99)
Glucose-Capillary: 123 mg/dL — ABNORMAL HIGH (ref 70–99)
Glucose-Capillary: 124 mg/dL — ABNORMAL HIGH (ref 70–99)
Glucose-Capillary: 129 mg/dL — ABNORMAL HIGH (ref 70–99)
Glucose-Capillary: 145 mg/dL — ABNORMAL HIGH (ref 70–99)
Glucose-Capillary: 199 mg/dL — ABNORMAL HIGH (ref 70–99)
Glucose-Capillary: 203 mg/dL — ABNORMAL HIGH (ref 70–99)
Glucose-Capillary: 92 mg/dL (ref 70–99)

## 2014-01-04 LAB — CBC
HCT: 26.7 % — ABNORMAL LOW (ref 39.0–52.0)
HCT: 26 % — ABNORMAL LOW (ref 39.0–52.0)
HEMOGLOBIN: 8.5 g/dL — AB (ref 13.0–17.0)
Hemoglobin: 8.8 g/dL — ABNORMAL LOW (ref 13.0–17.0)
MCH: 27.9 pg (ref 26.0–34.0)
MCH: 28.1 pg (ref 26.0–34.0)
MCHC: 33 g/dL (ref 30.0–36.0)
MCHC: 32.7 g/dL (ref 30.0–36.0)
MCV: 84.8 fL (ref 78.0–100.0)
MCV: 85.8 fL (ref 78.0–100.0)
PLATELETS: 200 10*3/uL (ref 150–400)
Platelets: 195 10*3/uL (ref 150–400)
RBC: 3.15 MIL/uL — ABNORMAL LOW (ref 4.22–5.81)
RBC: 3.03 MIL/uL — ABNORMAL LOW (ref 4.22–5.81)
RDW: 14.2 % (ref 11.5–15.5)
RDW: 14.5 % (ref 11.5–15.5)
WBC: 8.1 10*3/uL (ref 4.0–10.5)
WBC: 9.8 10*3/uL (ref 4.0–10.5)

## 2014-01-04 LAB — POCT I-STAT 3, ART BLOOD GAS (G3+)
Acid-Base Excess: 4 mmol/L — ABNORMAL HIGH (ref 0.0–2.0)
Bicarbonate: 31.3 mEq/L — ABNORMAL HIGH (ref 20.0–24.0)
O2 Saturation: 94 %
Patient temperature: 37.3
TCO2: 33 mmol/L (ref 0–100)
pCO2 arterial: 61.2 mmHg (ref 35.0–45.0)
pH, Arterial: 7.318 — ABNORMAL LOW (ref 7.350–7.450)
pO2, Arterial: 83 mmHg (ref 80.0–100.0)

## 2014-01-04 LAB — POCT I-STAT, CHEM 8
BUN: 19 mg/dL (ref 6–23)
Calcium, Ion: 1.17 mmol/L (ref 1.13–1.30)
Chloride: 99 mEq/L (ref 96–112)
Creatinine, Ser: 1.2 mg/dL (ref 0.50–1.35)
Glucose, Bld: 161 mg/dL — ABNORMAL HIGH (ref 70–99)
HCT: 26 % — ABNORMAL LOW (ref 39.0–52.0)
Hemoglobin: 8.8 g/dL — ABNORMAL LOW (ref 13.0–17.0)
Potassium: 4.5 mEq/L (ref 3.7–5.3)
Sodium: 134 mEq/L — ABNORMAL LOW (ref 137–147)
TCO2: 26 mmol/L (ref 0–100)

## 2014-01-04 LAB — PREPARE FRESH FROZEN PLASMA: Unit division: 0

## 2014-01-04 LAB — PREPARE PLATELET PHERESIS: Unit division: 0

## 2014-01-04 LAB — BASIC METABOLIC PANEL
ANION GAP: 9 (ref 5–15)
BUN: 15 mg/dL (ref 6–23)
CALCIUM: 8.3 mg/dL — AB (ref 8.4–10.5)
CO2: 28 mEq/L (ref 19–32)
CREATININE: 0.92 mg/dL (ref 0.50–1.35)
Chloride: 103 mEq/L (ref 96–112)
GFR calc Af Amer: >90 mL/min (ref >90)
GFR calc non Af Amer: 87 mL/min — ABNORMAL LOW (ref >90)
Glucose, Bld: 105 mg/dL — ABNORMAL HIGH (ref 70–99)
Potassium: 5 mEq/L (ref 3.7–5.3)
Sodium: 140 mEq/L (ref 137–147)

## 2014-01-04 LAB — CREATININE, SERUM
CREATININE: 1.1 mg/dL (ref 0.50–1.35)
GFR calc non Af Amer: 69 mL/min — ABNORMAL LOW (ref >90)
GFR, EST AFRICAN AMERICAN: 79 mL/min — AB (ref >90)

## 2014-01-04 LAB — MAGNESIUM
MAGNESIUM: 2 mg/dL (ref 1.5–2.5)
Magnesium: 2.3 mg/dL (ref 1.5–2.5)

## 2014-01-04 MED ORDER — INSULIN DETEMIR 100 UNIT/ML ~~LOC~~ SOLN
14.0000 [IU] | Freq: Every day | SUBCUTANEOUS | Status: DC
  Administered 2014-01-05: 14 [IU] via SUBCUTANEOUS
  Filled 2014-01-04 (×2): qty 0.14

## 2014-01-04 MED ORDER — KETOROLAC TROMETHAMINE 15 MG/ML IJ SOLN
15.0000 mg | Freq: Four times a day (QID) | INTRAMUSCULAR | Status: DC | PRN
  Administered 2014-01-04: 15 mg via INTRAVENOUS
  Filled 2014-01-04: qty 1

## 2014-01-04 MED ORDER — INSULIN ASPART 100 UNIT/ML ~~LOC~~ SOLN
0.0000 [IU] | SUBCUTANEOUS | Status: DC
  Administered 2014-01-04 (×2): 2 [IU] via SUBCUTANEOUS
  Administered 2014-01-04: 4 [IU] via SUBCUTANEOUS
  Administered 2014-01-04: 8 [IU] via SUBCUTANEOUS
  Administered 2014-01-05 (×2): 4 [IU] via SUBCUTANEOUS

## 2014-01-04 MED ORDER — FUROSEMIDE 10 MG/ML IJ SOLN
20.0000 mg | Freq: Two times a day (BID) | INTRAMUSCULAR | Status: DC
  Administered 2014-01-04 – 2014-01-05 (×3): 20 mg via INTRAVENOUS
  Filled 2014-01-04 (×5): qty 2

## 2014-01-04 MED ORDER — AMIODARONE HCL IN DEXTROSE 360-4.14 MG/200ML-% IV SOLN
60.0000 mg/h | INTRAVENOUS | Status: DC
Start: 2014-01-04 — End: 2014-01-05
  Administered 2014-01-04 – 2014-01-05 (×2): 60 mg/h via INTRAVENOUS
  Filled 2014-01-04 (×2): qty 200

## 2014-01-04 MED ORDER — AMIODARONE HCL IN DEXTROSE 360-4.14 MG/200ML-% IV SOLN
30.0000 mg/h | INTRAVENOUS | Status: DC
  Filled 2014-01-04 (×2): qty 200

## 2014-01-04 MED ORDER — INSULIN DETEMIR 100 UNIT/ML ~~LOC~~ SOLN
14.0000 [IU] | Freq: Once | SUBCUTANEOUS | Status: AC
  Administered 2014-01-04: 14 [IU] via SUBCUTANEOUS
  Filled 2014-01-04: qty 0.14

## 2014-01-04 MED ORDER — FENTANYL 50 MCG/HR TD PT72
75.0000 ug | MEDICATED_PATCH | TRANSDERMAL | Status: DC
  Administered 2014-01-04 – 2014-01-07 (×2): 75 ug via TRANSDERMAL
  Filled 2014-01-04 (×3): qty 1

## 2014-01-04 MED ORDER — CETYLPYRIDINIUM CHLORIDE 0.05 % MT LIQD
7.0000 mL | Freq: Two times a day (BID) | OROMUCOSAL | Status: DC
  Administered 2014-01-04 – 2014-01-08 (×8): 7 mL via OROMUCOSAL

## 2014-01-04 MED ORDER — AMIODARONE LOAD VIA INFUSION
150.0000 mg | Freq: Once | INTRAVENOUS | Status: AC
  Administered 2014-01-04: 150 mg via INTRAVENOUS
  Filled 2014-01-04: qty 83.34

## 2014-01-04 NOTE — Progress Notes (Signed)
Pt had a couple of approximately 10 beat runs of a fib followed by a 15 sec run of a fib rate 180s.  BP 120s/80s, pt asymptomatic.  Pt returned spontaneously back to SR 80s.  Scheduled po lopressor given.  Dr. Donata ClayVan Trigt notified, orders received to start amio drip. Will continue to monitor.  Roselie AwkwardShannon Kavon Valenza, RN

## 2014-01-04 NOTE — Progress Notes (Signed)
1 Day Post-Op Procedure(s) (LRB): CORONARY ARTERY BYPASS GRAFTING (CABG) x four, using left internal mammary and right leg greater saphenous vein harvested endoscopically (N/A) TRANSESOPHAGEAL ECHOCARDIOGRAM (TEE) (N/A) Subjective: Doing well after urgent CABG CXR with low lung volumes Wt up 6 ibs  Objective: Vital signs in last 24 hours: Temp:  [97.9 F (36.6 C)-99.7 F (37.6 C)] 98.6 F (37 C) (10/18 0800) Pulse Rate:  [59-100] 88 (10/18 0800) Cardiac Rhythm:  [-] Normal sinus rhythm (10/18 0800) Resp:  [12-26] 16 (10/18 0800) BP: (87-139)/(52-80) 114/69 mmHg (10/18 0800) SpO2:  [82 %-100 %] 95 % (10/18 0800) Arterial Line BP: (90-138)/(52-68) 125/64 mmHg (10/18 0800) FiO2 (%):  [40 %-80 %] 40 % (10/17 1800) Weight:  [268 lb 1.3 oz (121.6 kg)] 268 lb 1.3 oz (121.6 kg) (10/18 0602)  Hemodynamic parameters for last 24 hours: PAP: (28-35)/(12-25) 31/19 mmHg CO:  [5 L/min-8.7 L/min] 8.2 L/min CI:  [2.1 L/min/m2-3.7 L/min/m2] 3.5 L/min/m2  Intake/Output from previous day: 10/17 0701 - 10/18 0700 In: 6183.7 [I.V.:3813.7; Blood:1280; IV Piggyback:1090] Out: 4744 [Urine:3440; Blood:1050; Chest Tube:254] Intake/Output this shift: Total I/O In: 44.4 [I.V.:44.4] Out: 145 [Urine:135; Chest Tube:10]  Alert , comfotable extrem warm abd soft  Lab Results:  Recent Labs  01/03/14 2116 01/03/14 2222 01/04/14 0400  WBC 11.7*  --  8.1  HGB 9.4* 9.5* 8.8*  HCT 28.6* 28.0* 26.7*  PLT 231  --  200   BMET:  Recent Labs  01/03/14 0320  01/03/14 2222 01/04/14 0400  NA 142  < > 133* 140  K 4.4  < > 6.7* 5.0  CL 103  < > 110 103  CO2 28  --   --  28  GLUCOSE 190*  < > 109* 105*  BUN 17  < > 18 15  CREATININE 0.91  < > 0.90 0.92  CALCIUM 9.0  --   --  8.3*  < > = values in this interval not displayed.  PT/INR:  Recent Labs  01/03/14 1440  LABPROT 16.3*  INR 1.30   ABG    Component Value Date/Time   PHART 7.344* 01/03/2014 2110   HCO3 28.5* 01/03/2014 2110   TCO2  30 01/03/2014 2222   ACIDBASEDEF 1.0 01/03/2014 1318   O2SAT 95.0 01/03/2014 2110   CBG (last 3)   Recent Labs  01/03/14 2319 01/04/14 0027 01/04/14 0119  GLUCAP 119* 101* 105*    Assessment/Plan: S/P Procedure(s) (LRB): CORONARY ARTERY BYPASS GRAFTING (CABG) x four, using left internal mammary and right leg greater saphenous vein harvested endoscopically (N/A) TRANSESOPHAGEAL ECHOCARDIOGRAM (TEE) (N/A) Mobilize Lasix bid  LOS: 2 days    Jake Castro,Jake Castro 01/04/2014

## 2014-01-04 NOTE — Progress Notes (Signed)
CT surgery p.m. Rounds  Stable day Out of bed to chair Maintaining sinus rhythm P.m. labs reviewed and are satisfactory

## 2014-01-04 NOTE — Op Note (Signed)
NAME:  Jake Castro, [name]               ACCOUNT NO.:  [id number]  MEDICAL RECORD NO.:  [id number]  LOCATION:  2S02C                        FACILITY:  [facility]  PHYSICIAN:  Jake PernaPeter Van Castro, M.D.  DATE OF BIRTH:  04/11/1950  DATE OF PROCEDURE:  01/03/2014 DATE OF DISCHARGE:                              OPERATIVE REPORT   OPERATION: 1. Urgent coronary artery bypass grafting x4 (left internal mammary     artery to left anterior descending, saphenous vein graft to ramus     intermediate, saphenous vein graft to obtuse marginal, saphenous     vein graft to posterior descending). 2. Endoscopic harvest of right leg greater saphenous vein.  SURGEON:  Jake PernaPeter Van Castro, M.D.  ASSISTANT:  Coral CeoGina Collins, PA-C.  ANESTHESIA:  General by Dr. Laverle HobbyGregory Smith.  PREOPERATIVE DIAGNOSES:  Unstable angina, severe left main stenosis with three-vessel coronary artery disease.  POSTOPERATIVE DIAGNOSES:  Unstable angina, severe left main stenosis with three-vessel coronary artery disease.  INDICATIONS:  The patient is a 63 year old Caucasian male with a remote history of PCI stent of the RCA who presented with symptoms of unstable angina to an outside hospital.  He had positive cardiac enzymes consistent with a non-ST-elevation MI.  He underwent cardiac catheterization, which demonstrated a 95% left main stenosis with three- vessel coronary artery disease and fairly well-preserved LV function. He was transferred to this facility for coronary artery bypass grafting. Prior to transfer, the patient was fed a full meal and he received his morning dose of Plavix, which he had been taking for several years for the previous PCI.  I examined the patient in the CCU and reviewed the results of his cardiac catheterization with the patient and family.  I discussed the indications and expected benefits of multivessel coronary artery bypass grafting for treatment of his severe coronary artery disease.  I reviewed the major  aspects of the procedure including the location of the surgical incisions, use of general anesthesia and cardiopulmonary bypass, and the expected postoperative hospital recovery.  I reviewed with the patient the major benefits of the surgery, the alternatives to surgery, and the potential risks of stroke, MI, bleeding, blood transfusion requirement, infection, postoperative pleural effusions, postoperative arrhythmias, and death.  He demonstrated his understanding and agreed to proceed with surgery under what I felt was an informed consent.  OPERATIVE FINDINGS: 1. Adequate conduit. 2. Difficult targets in the circumflex and right coronary distribution     - small and diffusely diseased. 3. Mild postoperative coagulopathy related to the patient's long     history of Plavix use.  OPERATIVE PROCEDURE IN DETAIL:  The patient was brought to the operating room, placed supine on the operating table where general anesthesia was induced under invasive hemodynamic monitoring.  A transesophageal echo probe was placed by the anesthesiologist, which confirmed the preoperative diagnosis.  No significant valvular disease was demonstrated.  The patient was prepped and draped as a sterile field.  A proper time- out was performed.  A sternal incision was made as the saphenous vein was harvested endoscopically from the right leg.  The left internal mammary artery was harvested as a pedicle graft from its origin at the subclavian vessels.  It was a 1.5-mm vessel with excellent flow.  The sternal retractor was placed and the pericardium was opened and suspended.  Pursestrings were placed in the ascending aorta and right atrium and heparin was administered after the vein had been harvested. When the ACT was documented as being therapeutic, the patient was cannulated and placed on cardiopulmonary bypass.  The coronary arteries were identified for grafting.  The distal posterior descending was small, but  graftable.  The distal circumflex was also small, but graftable.  The vessels would not be targets for further redo bypass surgery in future.  The ramus intermediate was intramyocardial.  Cardioplegia cannulas were placed for both antegrade and retrograde cold blood cardioplegia and the patient was cooled to 32 degrees.  The aortic crossclamp was applied and a liter of cold blood cardioplegia was delivered.  There was good cardioplegic arrest and septal temperature dropped to less than 14 degrees.  Cardioplegia was delivered every 20 minutes or less while the crossclamp was in place.  The distal coronary anastomoses were then performed.  The first distal anastomosis was to the posterior descending branch of the right coronary.  This was a 1.2-mm vessel with proximal 90% stenosis.  A reverse saphenous vein was sewn end-to-side with running 7-0 Prolene with good flow through the graft.  The second distal anastomosis was the OM branch of the left circumflex. This was a small 1.2-mm vessel with proximal 95% left main stenosis.  A reverse saphenous vein was sewn end-to-side with running 7-0 Prolene with good flow through the graft.  Cardioplegia was redosed.  The third distal anastomosis was the ramus intermediate branch to the left coronary.  This was a 1.5-mm vessel with a proximal 90% stenosis and heavily plaque obstruction.  It was a 1.5 and 1.6 mm vessel and a reverse saphenous vein was sewn end-to-side with running 7-0 Prolene with good flow through the graft.  Cardioplegia was redosed.  The fourth distal anastomosis was to the LAD.  It had diffuse calcium and plaque disease.  There was a proximal 95% left main.  The left mammary artery pedicle was brought through an opening in the left lateral pericardium, was brought down onto the LAD and sewn end-to-side with running 8-0 Prolene.  There was good flow through the anastomosis after briefly releasing the pedicle bulldog on the  mammary artery.  The bulldog was reapplied and the pedicle was secured to the epicardium with 6-0 Prolene.  Cardioplegia was redosed.  While the crossclamp was still in place, 3 proximal vein anastomoses were performed on the ascending aorta using a 4.5 mm punch and running 6- 0 Prolene.  Prior to tying down the final proximal anastomosis, air was vented from the coronaries with a dose of retrograde warm blood cardioplegia.  The crossclamp was removed.  The heart then resumed a spontaneous rhythm.  The vein grafts were de-aired and opened and each had good flow.  Hemostasis was documented at the proximal and distal anastomoses.  The patient was rewarmed and reperfused.  Temporary pacing wires were applied.  The lungs re-expanded and ventilator was resumed. The patient was then weaned from cardiopulmonary bypass without difficulty on low-dose renal dose dopamine.  Global LV function was normal.  Hemodynamics were stable.  Protamine was administered without adverse reaction.  The cannula was removed.  The mediastinum was irrigated with warm saline.  The superior pericardial fat was closed over the aorta.  An anterior mediastinal and left pleural chest tube were placed and brought out  through separate incisions.  The sternum was closed with wire.  The pectoralis fascia was closed in running #1 Vicryl.  The subcutaneous and skin layers were closed in running Vicryl and sterile dressings were applied.  Total cardiopulmonary bypass time was 160 minutes.     Jake Castro, M.D.     PV/MEDQ  D:  01/04/2014  T:  01/04/2014  Job:  [id number]  cc:   Arnoldo Hooker, MD

## 2014-01-05 ENCOUNTER — Inpatient Hospital Stay (HOSPITAL_COMMUNITY): Payer: Medicare PPO

## 2014-01-05 LAB — GLUCOSE, CAPILLARY
Glucose-Capillary: 154 mg/dL — ABNORMAL HIGH (ref 70–99)
Glucose-Capillary: 125 mg/dL — ABNORMAL HIGH (ref 70–99)
Glucose-Capillary: 129 mg/dL — ABNORMAL HIGH (ref 70–99)
Glucose-Capillary: 185 mg/dL — ABNORMAL HIGH (ref 70–99)
Glucose-Capillary: 192 mg/dL — ABNORMAL HIGH (ref 70–99)
Glucose-Capillary: 184 mg/dL — ABNORMAL HIGH (ref 70–99)
Glucose-Capillary: 180 mg/dL — ABNORMAL HIGH (ref 70–99)

## 2014-01-05 LAB — BASIC METABOLIC PANEL
Anion gap: 9 (ref 5–15)
BUN: 18 mg/dL (ref 6–23)
CO2: 29 mEq/L (ref 19–32)
Calcium: 8.4 mg/dL (ref 8.4–10.5)
Chloride: 97 mEq/L (ref 96–112)
Creatinine, Ser: 0.88 mg/dL (ref 0.50–1.35)
GFR calc Af Amer: >90 mL/min (ref >90)
GFR calc non Af Amer: 88 mL/min — ABNORMAL LOW (ref >90)
Glucose, Bld: 165 mg/dL — ABNORMAL HIGH (ref 70–99)
Potassium: 4.5 mEq/L (ref 3.7–5.3)
Sodium: 135 mEq/L — ABNORMAL LOW (ref 137–147)

## 2014-01-05 LAB — CBC
HCT: 24.2 % — ABNORMAL LOW (ref 39.0–52.0)
Hemoglobin: 7.9 g/dL — ABNORMAL LOW (ref 13.0–17.0)
MCH: 28.4 pg (ref 26.0–34.0)
MCHC: 32.6 g/dL (ref 30.0–36.0)
MCV: 87.1 fL (ref 78.0–100.0)
Platelets: 183 10*3/uL (ref 150–400)
RBC: 2.78 MIL/uL — ABNORMAL LOW (ref 4.22–5.81)
RDW: 14.6 % (ref 11.5–15.5)
WBC: 9.1 10*3/uL (ref 4.0–10.5)

## 2014-01-05 MED ORDER — SODIUM CHLORIDE 0.9 % IJ SOLN
3.0000 mL | INTRAMUSCULAR | Status: DC | PRN
Start: 2014-01-05 — End: 2014-01-08

## 2014-01-05 MED ORDER — FE FUMARATE-B12-VIT C-FA-IFC PO CAPS
1.0000 | ORAL_CAPSULE | Freq: Two times a day (BID) | ORAL | Status: DC
Start: 2014-01-05 — End: 2014-01-08
  Administered 2014-01-05 – 2014-01-08 (×7): 1 via ORAL
  Filled 2014-01-05 (×8): qty 1

## 2014-01-05 MED ORDER — BISACODYL 10 MG RE SUPP
10.0000 mg | Freq: Once | RECTAL | Status: AC
  Administered 2014-01-05: 10 mg via RECTAL
  Filled 2014-01-05: qty 1

## 2014-01-05 MED ORDER — AMIODARONE HCL 200 MG PO TABS
400.0000 mg | ORAL_TABLET | Freq: Two times a day (BID) | ORAL | Status: DC
  Administered 2014-01-05 – 2014-01-08 (×7): 400 mg via ORAL
  Filled 2014-01-05 (×8): qty 2

## 2014-01-05 MED ORDER — MORPHINE SULFATE 2 MG/ML IJ SOLN: 2.0000 mg | INTRAMUSCULAR | Status: DC | PRN

## 2014-01-05 MED ORDER — MAGNESIUM HYDROXIDE 400 MG/5ML PO SUSP: 30.0000 mL | Freq: Every day | ORAL | Status: DC | PRN

## 2014-01-05 MED ORDER — SODIUM CHLORIDE 0.9 % IV SOLN: 250.0000 mL | INTRAVENOUS | Status: DC | PRN

## 2014-01-05 MED ORDER — MOVING RIGHT ALONG BOOK
Freq: Once | Status: AC
  Administered 2014-01-05: 13:00:00
  Filled 2014-01-05: qty 1

## 2014-01-05 MED ORDER — FUROSEMIDE 40 MG PO TABS
40.0000 mg | ORAL_TABLET | Freq: Every day | ORAL | Status: DC
  Administered 2014-01-05 – 2014-01-08 (×4): 40 mg via ORAL
  Filled 2014-01-05 (×4): qty 1

## 2014-01-05 MED ORDER — INSULIN ASPART 100 UNIT/ML ~~LOC~~ SOLN
0.0000 [IU] | Freq: Three times a day (TID) | SUBCUTANEOUS | Status: DC
  Administered 2014-01-05 – 2014-01-06 (×5): 4 [IU] via SUBCUTANEOUS
  Administered 2014-01-06 – 2014-01-07 (×3): 8 [IU] via SUBCUTANEOUS
  Administered 2014-01-07: 2 [IU] via SUBCUTANEOUS
  Administered 2014-01-08: 8 [IU] via SUBCUTANEOUS

## 2014-01-05 MED ORDER — SODIUM CHLORIDE 0.9 % IJ SOLN
3.0000 mL | Freq: Two times a day (BID) | INTRAMUSCULAR | Status: DC
  Administered 2014-01-05 – 2014-01-07 (×4): 3 mL via INTRAVENOUS

## 2014-01-05 MED ORDER — ATORVASTATIN CALCIUM 20 MG PO TABS
20.0000 mg | ORAL_TABLET | Freq: Every day | ORAL | Status: DC
  Administered 2014-01-05 – 2014-01-07 (×3): 20 mg via ORAL
  Filled 2014-01-05 (×4): qty 1

## 2014-01-05 MED ORDER — INSULIN ASPART 100 UNIT/ML ~~LOC~~ SOLN: 0.0000 [IU] | Freq: Three times a day (TID) | SUBCUTANEOUS | Status: DC

## 2014-01-05 MED ORDER — ZOLPIDEM TARTRATE 5 MG PO TABS
5.0000 mg | ORAL_TABLET | Freq: Every evening | ORAL | Status: DC | PRN
  Administered 2014-01-05: 5 mg via ORAL
  Filled 2014-01-05: qty 1

## 2014-01-05 MED FILL — Magnesium Sulfate Inj 50%: INTRAMUSCULAR | Qty: 10 | Status: AC

## 2014-01-05 MED FILL — Heparin Sodium (Porcine) Inj 1000 Unit/ML: INTRAMUSCULAR | Qty: 30 | Status: AC

## 2014-01-05 MED FILL — Potassium Chloride Inj 2 mEq/ML: INTRAVENOUS | Qty: 40 | Status: AC

## 2014-01-05 NOTE — Progress Notes (Signed)
POD # 2 CABG  Up in chair   BP 131/64  Pulse 98  Temp(Src) 99.8 F (37.7 C) (Oral)  Resp 14  Ht 5\' 11"  (1.803 m)  Wt 266 lb 12.1 oz (121 kg)  BMI 37.22 kg/m2  SpO2 92%   Intake/Output Summary (Last 24 hours) at 01/05/14 1722 Last data filed at 01/05/14 1600  Gross per 24 hour  Intake 953.67 ml  Output   1995 ml  Net -1041.33 ml    Awaiting bed on step down

## 2014-01-05 NOTE — Progress Notes (Signed)
2 Days Post-Op Procedure(s) (LRB): CORONARY ARTERY BYPASS GRAFTING (CABG) x four, using left internal mammary and right leg greater saphenous vein harvested endoscopically (N/A) TRANSESOPHAGEAL ECHOCARDIOGRAM (TEE) (N/A) Subjective: progressing after urgent CABG for L main nsr with PAC's CXR w/ better lung volumes   Objective: Vital signs in last 24 hours: Temp:  [98.3 F (36.8 C)-99.1 F (37.3 C)] 98.6 F (37 C) (10/19 0708) Pulse Rate:  [41-102] 92 (10/19 0700) Cardiac Rhythm:  [-] Normal sinus rhythm (10/19 0400) Resp:  [10-27] 15 (10/19 0700) BP: (103-136)/(52-88) 103/76 mmHg (10/19 0700) SpO2:  [89 %-100 %] 93 % (10/19 0700) Arterial Line BP: (103-117)/(59-63) 103/59 mmHg (10/18 1000) Weight:  [266 lb 12.1 oz (121 kg)] 266 lb 12.1 oz (121 kg) (10/19 0500)  satbleparameters for last 24 hours:    Intake/Output from previous day: 10/18 0701 - 10/19 0700 In: 610.9 [I.V.:510.9; IV Piggyback:100] Out: 1780 [Urine:1610; Chest Tube:170] Intake/Output this shift:    Alert, comfortable abd soft extrem warm nsr  Lab Results:  Recent Labs  01/04/14 1700 01/04/14 1817 01/05/14 0445  WBC 9.8  --  9.1  HGB 8.5* 8.8* 7.9*  HCT 26.0* 26.0* 24.2*  PLT 195  --  183   BMET:  Recent Labs  01/04/14 0400  01/04/14 1817 01/05/14 0445  NA 140  --  134* 135*  K 5.0  --  4.5 4.5  CL 103  --  99 97  CO2 28  --   --  29  GLUCOSE 105*  --  161* 165*  BUN 15  --  19 18  CREATININE 0.92  < > 1.20 0.88  CALCIUM 8.3*  --   --  8.4  < > = values in this interval not displayed.  PT/INR:  Recent Labs  01/03/14 1440  LABPROT 16.3*  INR 1.30   ABG    Component Value Date/Time   PHART 7.344* 01/03/2014 2110   HCO3 28.5* 01/03/2014 2110   TCO2 26 01/04/2014 1817   ACIDBASEDEF 1.0 01/03/2014 1318   O2SAT 95.0 01/03/2014 2110   CBG (last 3)   Recent Labs  01/04/14 2305 01/05/14 0314 01/05/14 0706  GLUCAP 199* 185* 192*    Assessment/Plan: S/P Procedure(s)  (LRB): CORONARY ARTERY BYPASS GRAFTING (CABG) x four, using left internal mammary and right leg greater saphenous vein harvested endoscopically (N/A) TRANSESOPHAGEAL ECHOCARDIOGRAM (TEE) (N/A) Plan for transfer to step-down: see transfer orders Start po Fe for expected blood loss anemia  LOS: 3 days    [name],PETER 01/05/2014

## 2014-01-06 ENCOUNTER — Encounter (HOSPITAL_COMMUNITY): Payer: Self-pay | Admitting: Cardiothoracic Surgery

## 2014-01-06 ENCOUNTER — Inpatient Hospital Stay (HOSPITAL_COMMUNITY): Payer: Medicare PPO

## 2014-01-06 LAB — CBC
HCT: 21.9 % — ABNORMAL LOW (ref 39.0–52.0)
Hemoglobin: 7.3 g/dL — ABNORMAL LOW (ref 13.0–17.0)
MCH: 27.9 pg (ref 26.0–34.0)
MCHC: 33.3 g/dL (ref 30.0–36.0)
MCV: 83.6 fL (ref 78.0–100.0)
Platelets: 194 10*3/uL (ref 150–400)
RBC: 2.62 MIL/uL — ABNORMAL LOW (ref 4.22–5.81)
RDW: 14.5 % (ref 11.5–15.5)
WBC: 8.6 10*3/uL (ref 4.0–10.5)

## 2014-01-06 LAB — GLUCOSE, CAPILLARY
Glucose-Capillary: 192 mg/dL — ABNORMAL HIGH (ref 70–99)
Glucose-Capillary: 181 mg/dL — ABNORMAL HIGH (ref 70–99)
Glucose-Capillary: 216 mg/dL — ABNORMAL HIGH (ref 70–99)
Glucose-Capillary: 117 mg/dL — ABNORMAL HIGH (ref 70–99)
Glucose-Capillary: 170 mg/dL — ABNORMAL HIGH (ref 70–99)

## 2014-01-06 LAB — BASIC METABOLIC PANEL
Anion gap: 11 (ref 5–15)
BUN: 19 mg/dL (ref 6–23)
CO2: 31 mEq/L (ref 19–32)
Calcium: 8.5 mg/dL (ref 8.4–10.5)
Chloride: 92 mEq/L — ABNORMAL LOW (ref 96–112)
Creatinine, Ser: 0.87 mg/dL (ref 0.50–1.35)
GFR calc Af Amer: >90 mL/min (ref >90)
GFR calc non Af Amer: 89 mL/min — ABNORMAL LOW (ref >90)
Glucose, Bld: 140 mg/dL — ABNORMAL HIGH (ref 70–99)
Potassium: 3.7 mEq/L (ref 3.7–5.3)
Sodium: 134 mEq/L — ABNORMAL LOW (ref 137–147)

## 2014-01-06 LAB — PREPARE RBC (CROSSMATCH)

## 2014-01-06 MED ORDER — ALPRAZOLAM 0.5 MG PO TABS
0.5000 mg | ORAL_TABLET | Freq: Every evening | ORAL | Status: DC | PRN
  Administered 2014-01-06 – 2014-01-07 (×2): 0.5 mg via ORAL
  Filled 2014-01-06 (×2): qty 1

## 2014-01-06 MED ORDER — METFORMIN HCL 500 MG PO TABS
1000.0000 mg | ORAL_TABLET | Freq: Two times a day (BID) | ORAL | Status: DC
  Administered 2014-01-06 – 2014-01-08 (×5): 1000 mg via ORAL
  Filled 2014-01-06 (×7): qty 2

## 2014-01-06 MED ORDER — POTASSIUM CHLORIDE CRYS ER 20 MEQ PO TBCR
20.0000 meq | EXTENDED_RELEASE_TABLET | Freq: Every day | ORAL | Status: DC
  Administered 2014-01-06: 20 meq via ORAL
  Filled 2014-01-06 (×2): qty 1

## 2014-01-06 MED FILL — Sodium Chloride IV Soln 0.9%: INTRAVENOUS | Qty: 3000 | Status: AC

## 2014-01-06 MED FILL — Mannitol IV Soln 20%: INTRAVENOUS | Qty: 500 | Status: AC

## 2014-01-06 MED FILL — Heparin Sodium (Porcine) Inj 1000 Unit/ML: INTRAMUSCULAR | Qty: 30 | Status: AC

## 2014-01-06 MED FILL — Lidocaine HCl IV Inj 20 MG/ML: INTRAVENOUS | Qty: 5 | Status: AC

## 2014-01-06 MED FILL — Calcium Chloride Inj 10%: INTRAVENOUS | Qty: 10 | Status: AC

## 2014-01-06 MED FILL — Electrolyte-R (PH 7.4) Solution: INTRAVENOUS | Qty: 4000 | Status: AC

## 2014-01-06 MED FILL — Sodium Bicarbonate IV Soln 8.4%: INTRAVENOUS | Qty: 50 | Status: AC

## 2014-01-06 NOTE — Plan of Care (Signed)
Problem: Phase I - Pre-Op Goal: Point person for discharge identified Outcome: Completed/Met Date Met:  01/06/14 Home with wife at D/C

## 2014-01-06 NOTE — Progress Notes (Addendum)
      [address]       [place] [id number]             [phone]        4 Days Post-Op Procedure(s) (LRB): CORONARY ARTERY BYPASS GRAFTING (CABG) x four, using left internal mammary and right leg greater saphenous vein harvested endoscopically (N/A) TRANSESOPHAGEAL ECHOCARDIOGRAM (TEE) (N/A)  Subjective: Patient passing flatus but no real bowel movement yet.  Objective: Vital signs in last 24 hours: Temp:  [98 F (36.7 C)-99.6 F (37.6 C)] 99.2 F (37.3 C) (10/21 0514) Pulse Rate:  [86-108] 88 (10/21 0514) Cardiac Rhythm:  [-] Normal sinus rhythm (10/20 2002) Resp:  [17-20] 18 (10/21 0514) BP: (124-144)/(51-87) 130/64 mmHg (10/21 0514) SpO2:  [93 %-97 %] 95 % (10/21 0514) Weight:  [260 lb 12.9 oz (118.3 kg)] 260 lb 12.9 oz (118.3 kg) (10/21 0500)  Pre op weight 1180kg Current Weight  01/07/14 260 lb 12.9 oz (118.3 kg)      Intake/Output from previous day: 10/20 0701 - 10/21 0700 In: 1210 [P.O.:780; Blood:430] Out: 1200 [Urine:1200]   Physical Exam:  Cardiovascular: RRR, no murmurs, gallops, or rubs. Pulmonary: Clear to auscultation bilaterally; no rales, wheezes, or rhonchi. Abdomen: Soft, non tender, bowel sounds present. Extremities: Mild bilateral lower extremity edema. Wounds: Clean and dry.  No erythema or signs of infection.  Lab Results: CBC:  Recent Labs  01/06/14 0300 01/07/14 0508  WBC 8.6 9.0  HGB 7.3* 8.2*  HCT 21.9* 25.2*  PLT 194 264   BMET:   Recent Labs  01/06/14 0300 01/07/14 0508  NA 134* 138  K 3.7 4.6  CL 92* 96  CO2 31 30  GLUCOSE 140* 166*  BUN 19 21  CREATININE 0.87 0.90  CALCIUM 8.5 8.7    PT/INR:  Lab Results  Component Value Date   INR 1.30 01/03/2014   INR 1.08 01/02/2014   ABG:  INR: Will add last result for INR, ABG once components are confirmed Will add last 4 CBG results once components are confirmed  Assessment/Plan:  1. CV - Previous a fib. Maintaining SR in the 90's. On  Amiodarone 400 bid,Lopressor 12.5 bid. Increase Lopressor to 25 bid for better HR and BP control. 2.  Pulmonary - Encourage incentive spirometer. 3. Volume Overload - On Lasix 40 daily 4.  Acute blood loss anemia - H and H up to 8.2 and 25.2. Continue Trinsicon. 5. DM-CBGs 117/170/213 . Pre op HGA1C 7.2. On Metformin and Insulin. 6. Remove EPW  7. Remove sutures in am 8. LOC constipation 9. Possible discharge in am  Castro,Jake MPA-C 01/07/2014,9:33 AM  patient examined and medical record reviewed,agree with above note. [name],Jake Castro 01/08/2014

## 2014-01-06 NOTE — Progress Notes (Signed)
Pt received into room 2w02, pt oriented to room and call bell, pt assisted to bathroom, tele placed on pt, will continue to monitor Archie BalboaStein, Damier Disano G, RN

## 2014-01-06 NOTE — Progress Notes (Signed)
UR complete.  Warnie Belair RN, MSN 

## 2014-01-06 NOTE — Progress Notes (Signed)
CARDIAC REHAB PHASE I   PRE:  Rate/Rhythm: 97 SR with PAC    BP: sitting 126/65    SaO2: 97 RA  MODE:  Ambulation: 500 ft   POST:  Rate/Rhythm: 107 ST with increased PACs    BP: sitting 140/67     SaO2: 97 RA  Pt steady with RW. Only c/o is lack of sleep. Had issues with leaking IV while getting blood during walk therefore cut walk short. To recliner. Encouraged IS and x1 more walk tonight. Pt requesting xanax to help him sleep tonight.  [id number]   Jake Castro, Jake Castro CES, ACSM 01/06/2014 2:55 PM

## 2014-01-06 NOTE — Progress Notes (Addendum)
      [address]       [place] [id number]             [phone]      3 Days Post-Op Procedure(s) (LRB): CORONARY ARTERY BYPASS GRAFTING (CABG) x four, using left internal mammary and right leg greater saphenous vein harvested endoscopically (N/A) TRANSESOPHAGEAL ECHOCARDIOGRAM (TEE) (N/A)  Subjective:  Jake Castro states he feels okay this morning.  He has some minor surgical discomfort and states the fentanyl patch really helps with his pain.  He states he is tired this morning.  + ambulation  Objective: Vital signs in last 24 hours: Temp:  [98.2 F (36.8 C)-99.8 F (37.7 C)] 98.2 F (36.8 C) (10/20 0400) Pulse Rate:  [89-105] 90 (10/20 0500) Cardiac Rhythm:  [-] Normal sinus rhythm (10/20 0500) Resp:  [13-26] 22 (10/20 0500) BP: (103-135)/(47-98) 121/55 mmHg (10/20 0500) SpO2:  [91 %-99 %] 92 % (10/20 0500)  Intake/Output from previous day: 10/19 0701 - 10/20 0700 In: 1026.8 [P.O.:960; I.V.:66.8] Out: 2415 [Urine:2415]  General appearance: alert, cooperative and no distress Heart: regular rate and rhythm Lungs: clear to auscultation bilaterally Abdomen: soft, non-tender; bowel sounds normal; no masses,  no organomegaly Extremities: edema trace Wound: clean and dry  Lab Results:  Recent Labs  01/05/14 0445 01/06/14 0300  WBC 9.1 8.6  HGB 7.9* 7.3*  HCT 24.2* 21.9*  PLT 183 194   BMET:  Recent Labs  01/05/14 0445 01/06/14 0300  NA 135* 134*  K 4.5 3.7  CL 97 92*  CO2 29 31  GLUCOSE 165* 140*  BUN 18 19  CREATININE 0.88 0.87  CALCIUM 8.4 8.5    PT/INR:  Recent Labs  01/03/14 1440  LABPROT 16.3*  INR 1.30   ABG    Component Value Date/Time   PHART 7.344* 01/03/2014 2110   HCO3 28.5* 01/03/2014 2110   TCO2 26 01/04/2014 1817   ACIDBASEDEF 1.0 01/03/2014 1318   O2SAT 95.0 01/03/2014 2110   CBG (last 3)   Recent Labs  01/05/14 1151 01/05/14 1531 01/05/14 2155  GLUCAP 184* 180* 192*    Assessment/Plan: S/P  Procedure(s) (LRB): CORONARY ARTERY BYPASS GRAFTING (CABG) x four, using left internal mammary and right leg greater saphenous vein harvested endoscopically (N/A) TRANSESOPHAGEAL ECHOCARDIOGRAM (TEE) (N/A)  1. CV- NSR rate and pressure controlled- continue Amiodarone, Lopressor 2. Pulm- off oxygen, CXR not yet completed this morning, encouraged routine use of IS 3. Renal- creatinine WNL, K on the low side at 3.7, minimal hypervolemia weight is up 3 lbs, will repeat PO Lasix, supplement potassium 4. Expected post operative blood loss anemia-  Hgb down to 7.3 this morning, on FE supplementation, but may ultimately benefit from transfusion, especially if Hgb continues to drop 5. DM- sugars remain mildly elevated in the 180s-190s, will d/c insulin restart home Metformin 6. Dispo- patient doing very well,  Monitor H/H, awaiting transfer to 2W   LOS: 4 days    BARRETT, ERIN 01/06/2014  1 unit PRBC - symptomatic blood loss anemia tx to 2 west tele bed  patient examined and medical record reviewed,agree with above note. [name],Caoimhe Damron 01/06/2014

## 2014-01-06 NOTE — Plan of Care (Signed)
Problem: Phase II - Intermediate Post-Op Goal: Patient advanced to Phase III: Barriers addressed Outcome: Completed/Met Date Met:  01/06/14 Cardiovascular and pain barriers addressed

## 2014-01-07 LAB — BASIC METABOLIC PANEL
ANION GAP: 12 (ref 5–15)
BUN: 21 mg/dL (ref 6–23)
CALCIUM: 8.7 mg/dL (ref 8.4–10.5)
CO2: 30 meq/L (ref 19–32)
Chloride: 96 mEq/L (ref 96–112)
Creatinine, Ser: 0.9 mg/dL (ref 0.50–1.35)
GFR calc Af Amer: >90 mL/min (ref >90)
GFR, EST NON AFRICAN AMERICAN: 87 mL/min — AB (ref >90)
GLUCOSE: 166 mg/dL — AB (ref 70–99)
Potassium: 4.6 mEq/L (ref 3.7–5.3)
Sodium: 138 mEq/L (ref 137–147)

## 2014-01-07 LAB — TYPE AND SCREEN
ABO/RH(D): AB NEG
Antibody Screen: NEGATIVE
Unit division: 0

## 2014-01-07 LAB — CBC
HCT: 25.2 % — ABNORMAL LOW (ref 39.0–52.0)
Hemoglobin: 8.2 g/dL — ABNORMAL LOW (ref 13.0–17.0)
MCH: 28.3 pg (ref 26.0–34.0)
MCHC: 32.5 g/dL (ref 30.0–36.0)
MCV: 86.9 fL (ref 78.0–100.0)
Platelets: 264 10*3/uL (ref 150–400)
RBC: 2.9 MIL/uL — ABNORMAL LOW (ref 4.22–5.81)
RDW: 14.8 % (ref 11.5–15.5)
WBC: 9 10*3/uL (ref 4.0–10.5)

## 2014-01-07 LAB — GLUCOSE, CAPILLARY
Glucose-Capillary: 213 mg/dL — ABNORMAL HIGH (ref 70–99)
Glucose-Capillary: 156 mg/dL — ABNORMAL HIGH (ref 70–99)
Glucose-Capillary: 218 mg/dL — ABNORMAL HIGH (ref 70–99)
Glucose-Capillary: 93 mg/dL (ref 70–99)

## 2014-01-07 MED ORDER — METOPROLOL TARTRATE 25 MG PO TABS
25.0000 mg | ORAL_TABLET | Freq: Two times a day (BID) | ORAL | Status: DC
  Administered 2014-01-07 – 2014-01-08 (×3): 25 mg via ORAL
  Filled 2014-01-07 (×4): qty 1

## 2014-01-07 MED ORDER — LACTULOSE 10 GM/15ML PO SOLN
20.0000 g | Freq: Once | ORAL | Status: AC
  Administered 2014-01-07: 20 g via ORAL
  Filled 2014-01-07: qty 30

## 2014-01-07 MED ORDER — POTASSIUM CHLORIDE CRYS ER 20 MEQ PO TBCR
20.0000 meq | EXTENDED_RELEASE_TABLET | Freq: Every day | ORAL | Status: DC
  Administered 2014-01-08: 20 meq via ORAL
  Filled 2014-01-07: qty 1

## 2014-01-07 NOTE — Discharge Instructions (Signed)
Activity: 1.May walk up steps °               2.No lifting more than ten pounds for four weeks.  °               3.No driving for four weeks. °               4.Stop any activity that causes chest pain, shortness of breath, dizziness, sweating or excessive weakness. °               5.Avoid straining. °               6.Continue with your breathing exercises daily. ° °Diet: Diabetic diet and Low fat, Low salt diet ° °Wound Care: May shower.  Clean wounds with mild soap and water daily. Contact the office at [phone] if any problems arise. ° °Coronary Artery Bypass Grafting, Care After °Refer to this sheet in the next few weeks. These instructions provide you with information on caring for yourself after your procedure. Your health care provider may also give you more specific instructions. Your treatment has been planned according to current medical practices, but problems sometimes occur. Call your health care provider if you have any problems or questions after your procedure. °WHAT TO EXPECT AFTER THE PROCEDURE °Recovery from surgery will be different for everyone. Some people feel well after 3 or 4 weeks, while for others it takes longer. After your procedure, it is typical to have the following: °· Nausea and a lack of appetite.   °· Constipation. °· Weakness and fatigue.   °· Depression or irritability.   °· Pain or discomfort at your incision site. °HOME CARE INSTRUCTIONS °· Take medicines only as directed by your health care provider. Do not stop taking medicines or start any new medicines without first checking with your health care provider. °· Take your pulse as directed by your health care provider. °· Perform deep breathing as directed by your health care provider. If you were given a device called an incentive spirometer, use it to practice deep breathing several times a day. Support your chest with a pillow or your arms when you take deep breaths or cough. °· Keep incision areas clean, dry, and  protected. Remove or change any bandages (dressings) only as directed by your health care provider. You may have skin adhesive strips over the incision areas. Do not take the strips off. They will fall off on their own. °· Check incision areas daily for any swelling, redness, or drainage. °· If incisions were made in your legs, do the following: °¨ Avoid crossing your legs.   °¨ Avoid sitting for long periods of time. Change positions every 30 minutes.   °¨ Elevate your legs when you are sitting. °· Wear compression stockings as directed by your health care provider. These stockings help keep blood clots from forming in your legs. °· Take showers once your health care provider approves. Until then, only take sponge baths. Pat incisions dry. Do not rub incisions with a washcloth or towel. Do not take baths, swim, or use a hot tub until your health care provider approves. °· Eat foods that are high in fiber, such as raw fruits and vegetables, whole grains, beans, and nuts. Meats should be lean cut. Avoid canned, processed, and fried foods. °· Drink enough fluid to keep your urine clear or pale yellow. °· Weigh yourself every day. This helps identify if you are retaining fluid that may make your heart and lungs   work harder. °· Rest and limit activity as directed by your health care provider. You may be instructed to: °¨ Stop any activity at once if you have chest pain, shortness of breath, irregular heartbeats, or dizziness. Get help right away if you have any of these symptoms. °¨ Move around frequently for short periods or take short walks as directed by your health care provider. Increase your activities gradually. You may need physical therapy or cardiac rehabilitation to help strengthen your muscles and build your endurance. °¨ Avoid lifting, pushing, or pulling anything heavier than 10 lb (4.5 kg) for at least 6 weeks after surgery. °· Do not drive until your health care provider approves.  °· Ask your health  care provider when you may return to work. °· Ask your health care provider when you may resume sexual activity. °· Keep all follow-up visits as directed by your health care provider. This is important. °SEEK MEDICAL CARE IF: °· You have swelling, redness, increasing pain, or drainage at the site of an incision. °· You have a fever. °· You have swelling in your ankles or legs. °· You have pain in your legs.   °· You gain 2 or more pounds (0.9 kg) a day. °· You are nauseous or vomit. °· You have diarrhea.  °SEEK IMMEDIATE MEDICAL CARE IF: °· You have chest pain that goes to your jaw or arms. °· You have shortness of breath.   °· You have a fast or irregular heartbeat.   °· You notice a "clicking" in your breastbone (sternum) when you move.   °· You have numbness or weakness in your arms or legs. °· You feel dizzy or light-headed.   °MAKE SURE YOU: °· Understand these instructions. °· Will watch your condition. °· Will get help right away if you are not doing well or get worse. °Document Released: 09/23/2004 Document Revised: 07/21/2013 Document Reviewed: 08/13/2012 °[employer]® Patient Information ©2015 [employer]. This information is not intended to replace advice given to you by your health care provider. Make sure you discuss any questions you have with your health care provider. ° ° ° °

## 2014-01-07 NOTE — Progress Notes (Signed)
Pt ambulated 500 ft x1 assist with a front wheel walker on RA. Pt tolerated the walk well with no complaints. Pt stable and back in bed.

## 2014-01-07 NOTE — Discharge Summary (Signed)
Physician Discharge Summary       [address]       [place] [id number]             [phone]    Patient ID: Jake Castro MRN: [id number] DOB/AGE: 26-May-1950 63 y.o.  Admit date: 01/02/2014 Discharge date: 01/08/2014  Admission Diagnoses: 1. History of CAD (s/p MI,PCI with stent to RCA) 2. History of essential, benign hypertension 3. History of DM Type II 4. History of hyperlipidemia 5. History of carotid artery disease 6. History of tobacco abuse 7. History of obesity  Discharge Diagnoses:  1. History of CAD (s/p MI,PCI with stent to RCA) 2. History of essential, benign hypertension 3. History of DM Type II 4. History of hyperlipidemia 5. History of carotid artery disease 6. History of tobacco abuse 7. History of obesity 8. Post op a fib (converted to sinus rhythm)  Procedure (s):  1. Coronary catheterization done on 01/06/2014 by Dr. Gwen PoundsKowalski at [facility]. 2. Urgent coronary artery bypass grafting x4 (left internal mammary artery to left anterior descending, saphenous vein graft to ramus intermediate, saphenous vein graft to obtuse marginal, saphenous vein graft to posterior descending) with endoscopic harvest of right leg greater saphenous vein by Dr. Donata ClayVan Trigt on 01/03/2014.  History of Presenting Illness: This is a 63 year old Caucasian male with cardiac risk factors that include myocardial infarction (7 years ago),diabetes mellitus, hypertension, hyperlipidemia, and remote tobacco abuse who has had retro sternal chest pain with exertion. This occurs several times per week. The pain sometimes radiates to both arms and neck and is associated with shortness of breath. He states he takes Imdur and rests when symptoms occur. He underwent a cardiac catheterization at [facility] by Dr. Gwen PoundsKowalski earlier this morning. Results showed LVEF was 55%, 95%ostial left main stenosis, proximal 70% LAD stenosis, OM1 60% stenosis, and distal RCA  with 95% stenosis. In addition, he has previously had an echo which showed LVEF 50%, no AS or AI, mild MR and no MS, and no TR or PR.Jake PollackLe Castro cardiology was asked to admit the patient and Dr. Donata ClayVan Trigt was consulted for the consideration of coronary artery bypass grafting surgery. It should be noted that currently the patient is chest pain free. His last episode of clenching type chest pain occurred with exertion, radiated down both arms and into neck was at 4 pm yesterday. He states he had Plavix (not a loading dose) this morning and he last ate at 10:00 am. Currently, he is in no acute distress and vital signs are stable. He is chest pain free and does not have any shortness of breath. Dr. Donata ClayVan Trigt discussed the need for urgent coronary artery bypass grafting surgery. Potential risks, benefits, and complications were discussed with the patient and he agreed to proceed with surgery.Pre operative carotid duplex US showed no significant internal carotid artery stenosis. P2Y12 assay was checked (247 and 272) as patient given Plavix on 10/17. Patient underwent urgent CABG x 4 on 01/04/2014.  Brief Hospital Course:  The patient was extubated the evening of surgery without difficulty. He remained afebrile and hemodynamically stable. Theone MurdochSwan Ganz, a line, chest tubes, and foley were removed early in the post operative course. Lopressor was started and titrated accordingly. He was volume over loaded and diuresed. He was weaned off the insulin drip. Once he was tolerating a diet,  Metformin was restarted. The patient's HGA1C pre op was 7.2. The patient was felt surgically stable for transfer from  the ICU to PCTU for further convalescence on 01/06/2013. He continues to progress with cardiac rehab. He/she was ambulating on room air. He has been tolerating a diet and has had a bowel movement. Epicardial pacing wires and chest tube sutures will be removed prior to discharge. Provided the patient remains afebrile,  hemodynamically stable, and pending morning round evaluation, he will be surgically stable for discharge on 01/08/2013.   Latest Vital Signs: Blood pressure 130/57, pulse 95, temperature 98.2 F (36.8 C), temperature source Oral, resp. rate 18, height 5\' 11"  (1.803 m), weight 256 lb 13.4 oz (116.5 kg), SpO2 91.00%.  Physical Exam: Cardiovascular: RRR, no murmurs, gallops, or rubs.  Pulmonary: Clear to auscultation bilaterally; no rales, wheezes, or rhonchi.  Abdomen: Soft, non tender, bowel sounds present.  Extremities: Mild bilateral lower extremity edema.  Wounds: Clean and dry. No erythema or signs of infection.   Discharge Condition:Stable  Recent laboratory studies:  Lab Results  Component Value Date   WBC 9.0 01/07/2014   HGB 8.2* 01/07/2014   HCT 25.2* 01/07/2014   MCV 86.9 01/07/2014   PLT 264 01/07/2014   Lab Results  Component Value Date   NA 138 01/07/2014   K 4.6 01/07/2014   CL 96 01/07/2014   CO2 30 01/07/2014   CREATININE 0.90 01/07/2014   GLUCOSE 166* 01/07/2014     Diagnostic Studies: Dg Chest 2 View  01/06/2014   CLINICAL DATA:  Post CABG.  Chest shortness.  EXAM: CHEST  2 VIEW  COMPARISON:  01/05/2014  FINDINGS: Prior CABG. Cardiomegaly. Left basilar atelectasis or infiltrate. No confluent opacity on the right. Small left effusion.  IMPRESSION: Left lower lobe atelectasis or infiltrate with small left effusion. No significant change since prior study.   Electronically Signed   By: Charlett NoseKevin  Dover M.D.   On: 01/06/2014 08:42   Discharge Instructions   Amb Referral to Cardiac Rehabilitation    Complete by:  As directed   To Rives          Discharge Medications:   Medication List    STOP taking these medications       carvedilol 12.5 MG tablet  Commonly known as:  COREG     clopidogrel 75 MG tablet  Commonly known as:  PLAVIX     isosorbide mononitrate 30 MG 24 hr tablet  Commonly known as:  IMDUR     multivitamin with minerals Tabs  tablet     simvastatin 40 MG tablet  Commonly known as:  ZOCOR     VITAMIN B-COMPLEX PO     VITAMIN E PO      TAKE these medications       ALPRAZolam 0.5 MG tablet  Commonly known as:  XANAX  Take 0.25-0.5 mg by mouth at bedtime as needed for anxiety or sleep.     amiodarone 200 MG tablet  Commonly known as:  PACERONE  Take 2 tablets (400 mg total) by mouth 2 (two) times daily. For 2 days;then take Amiodarone 200 my by mouth two times daily thereafter     aspirin EC 325 MG tablet  Take 1 tablet (325 mg total) by mouth every evening.     atorvastatin 20 MG tablet  Commonly known as:  LIPITOR  Take 1 tablet (20 mg total) by mouth daily at 6 PM.     ferrous fumarate-b12-vitamic C-folic acid capsule  Commonly known as:  TRINSICON / FOLTRIN  Take 1 capsule by mouth daily.     furosemide 40 MG  tablet  Commonly known as:  LASIX  Take 1 tablet (40 mg total) by mouth daily. For 4 days then stop.     KRILL OIL PO  Take 1 tablet by mouth daily.     lisinopril 5 MG tablet  Commonly known as:  PRINIVIL,ZESTRIL  Take 1 tablet (5 mg total) by mouth daily.     metFORMIN 1000 MG tablet  Commonly known as:  GLUCOPHAGE  Take 1,000 mg by mouth 2 (two) times daily with a meal.     metoprolol tartrate 25 MG tablet  Commonly known as:  LOPRESSOR  Take 1 tablet (25 mg total) by mouth 2 (two) times daily.     oxyCODONE 5 MG immediate release tablet  Commonly known as:  Oxy IR/ROXICODONE  Take 1-2 tablets (5-10 mg total) by mouth every 4 (four) hours as needed for severe pain.     potassium chloride SA 20 MEQ tablet  Commonly known as:  K-DUR,KLOR-CON  Take 1 tablet (20 mEq total) by mouth daily. For 4 days then stop.     VICTOZA 18 MG/3ML Sopn  Generic drug:  Liraglutide  Inject 1.8 mg into the skin daily.     vitamin C 1000 MG tablet  Take 1,000 mg by mouth daily.     Vitamin D-3 5000 UNITS Tabs  Take 5,000 mg by mouth daily.     VITAMIN K PO  Take 1 tablet by mouth  daily.        The patient has been discharged on:   1.Beta Blocker:  Yes [ x  ]                              No   [   ]                              If No, reason:  2.Ace Inhibitor/ARB: Yes [  x ]                                     No  [    ]                                     If No, reason:  3.Statin:   Yes [ x  ]                  No  [   ]                  If No, reason:  4.Ecasa:  Yes  [  x ]                  No   [   ]                  If No, reason:  Follow Up Appointments: Follow-up Information   Follow up with Lamar Blinks, MD. (Call for a follow up appointment for 2 weeks)    Specialty:  Internal Medicine   Contact information:   [address] [place] [address] [phone]       Follow up with Mikey Bussing, MD On 02/11/2014. (PA/LAT CXR to be taken (at  [facility] which is in the same building as Dr. Zenaida Niece Trigt's office) on at  ;Appointment with Dr. Donata Clay is at )    Specialty:  Cardiothoracic Surgery   Contact information:   [address] [place] [address] [phone]       Follow up with Dorothey Baseman, MD. (Call for a follow up appointment regarding further surveillance of HGA1C 7.2)    Specialty:  Family Medicine   Contact information:   [address] [place] [address] [phone]       Signed: Doree Fudge MPA-C 01/08/2014, 7:56 AM

## 2014-01-07 NOTE — Progress Notes (Signed)
CARDIAC REHAB PHASE I   PRE:  Rate/Rhythm: 93 SR    BP: sitting in BR    SaO2:   MODE:  Ambulation: 550 ft   POST:  Rate/Rhythm: 104 ST    BP: sitting to BR     SaO2:   Pt ambulated x2 earlier today with RW. Tolerated well today, some SOB. Assist x1 to stand. Thinks he can borrow RW from his neighbor but his wife plans to check tonight. Began ed with pt and wife. Instructions given on sternal precautions, diet, ex and CRPII. Will check back in am to ensure no questions. [facility] and [facility] to [place]. [phone]   Elissa LovettReeve, Donnita Farina EdnaKristan CES, ACSM 01/07/2014 3:44 PM

## 2014-01-07 NOTE — Progress Notes (Signed)
Pt ambulated 550 ft x1 assist with a front wheel walker on RA. Pt tolerated walk well with no complaints. Pt returned to chair with call light within reach.

## 2014-01-07 NOTE — Progress Notes (Signed)
Removed EPW per M.D orders; per hospital policy. Pacing wires in tact upon removal.  Pt tolerated well. VSS. Pt reminded to remain in bed for one hour. Will continue to monitor.

## 2014-01-07 NOTE — Plan of Care (Signed)
Problem: Phase III - Recovery through Discharge Goal: Other Phase III Outcomes/Goals Outcome: Completed/Met Date Met:  01/07/14 Pacing wires pulled

## 2014-01-07 NOTE — Progress Notes (Signed)
Inpatient Diabetes Program Recommendations  AACE/ADA: New Consensus Statement on Inpatient Glycemic Control (2013)  Target Ranges:  Prepandial:   less than 140 mg/dL      Peak postprandial:   less than 180 mg/dL (1-2 hours)      Critically ill patients:  140 - 180 mg/dL   Reason for Assessment: Hyperglycemia   Diabetes history: Type 2 diabetes Outpatient Diabetes medications: Victoza 1.8 daily, Glucophage 1000 mg bid Current orders for Inpatient glycemic control: TCTS Correction, Glucophage 1000 mg bid   Results for Kathie RhodesMARTIN, Loc H (MRN [id number]) as of 01/07/2014 09:32  Ref. Range 01/06/2014 08:37 01/06/2014 11:06 01/06/2014 16:24 01/06/2014 21:17 01/07/2014 06:37  Glucose-Capillary Latest Range: 70-99 mg/dL 478181 (H) 295216 (H) 621117 (H) 170 (H) 213 (H)   Note:  Note before breakfast CBG trending upward.  May benefit from restarting Levemir.  Once discharged, presume patient will be able to resume Victoza.  Patient will need to check CBG's qid and see PCP as soon as possible if CBG's are sub-optimal at home.  Thank you.  Keegen Heffern S. Elsie Lincolnouth, RN, CNS, CDE Inpatient Diabetes Program, team pager [phone]

## 2014-01-08 LAB — GLUCOSE, CAPILLARY
Glucose-Capillary: 207 mg/dL — ABNORMAL HIGH (ref 70–99)
Glucose-Capillary: 147 mg/dL — ABNORMAL HIGH (ref 70–99)

## 2014-01-08 MED ORDER — METOPROLOL TARTRATE 25 MG PO TABS: 25.0000 mg | ORAL_TABLET | Freq: Two times a day (BID) | ORAL | Status: DC

## 2014-01-08 MED ORDER — FE FUMARATE-B12-VIT C-FA-IFC PO CAPS: 1.0000 | ORAL_CAPSULE | Freq: Every day | ORAL | Status: DC

## 2014-01-08 MED ORDER — ASPIRIN EC 325 MG PO TBEC: 325.0000 mg | DELAYED_RELEASE_TABLET | Freq: Every evening | ORAL | Status: DC

## 2014-01-08 MED ORDER — OXYCODONE HCL 5 MG PO TABS: 5.0000 mg | ORAL_TABLET | ORAL | Status: DC | PRN

## 2014-01-08 MED ORDER — AMIODARONE HCL 200 MG PO TABS: 400.0000 mg | ORAL_TABLET | Freq: Two times a day (BID) | ORAL | Status: DC

## 2014-01-08 MED ORDER — LISINOPRIL 5 MG PO TABS
5.0000 mg | ORAL_TABLET | Freq: Every day | ORAL | Status: DC
  Administered 2014-01-08: 5 mg via ORAL
  Filled 2014-01-08: qty 1

## 2014-01-08 MED ORDER — LISINOPRIL 5 MG PO TABS: 5.0000 mg | ORAL_TABLET | Freq: Every day | ORAL | Status: DC

## 2014-01-08 MED ORDER — POTASSIUM CHLORIDE CRYS ER 20 MEQ PO TBCR: 20.0000 meq | EXTENDED_RELEASE_TABLET | Freq: Every day | ORAL | Status: DC

## 2014-01-08 MED ORDER — FUROSEMIDE 40 MG PO TABS: 40.0000 mg | ORAL_TABLET | Freq: Every day | ORAL | Status: DC

## 2014-01-08 MED ORDER — ATORVASTATIN CALCIUM 20 MG PO TABS: 20.0000 mg | ORAL_TABLET | Freq: Every day | ORAL | Status: DC

## 2014-01-08 NOTE — Progress Notes (Addendum)
      [address]       [place] [id number]             [phone]        5 Days Post-Op Procedure(s) (LRB): CORONARY ARTERY BYPASS GRAFTING (CABG) x four, using left internal mammary and right leg greater saphenous vein harvested endoscopically (N/A) TRANSESOPHAGEAL ECHOCARDIOGRAM (TEE) (N/A)  Subjective: Patient had bowel movement yesterday. He feels well this am and wants to go home.  Objective: Vital signs in last 24 hours: Temp:  [98.2 F (36.8 C)-98.7 F (37.1 C)] 98.2 F (36.8 C) (10/22 0500) Pulse Rate:  [81-98] 95 (10/22 0500) Cardiac Rhythm:  [-] Normal sinus rhythm (10/21 2008) Resp:  [18] 18 (10/22 0500) BP: (106-143)/(57-77) 130/57 mmHg (10/22 0500) SpO2:  [91 %-96 %] 91 % (10/22 0500) Weight:  [256 lb 13.4 oz (116.5 kg)] 256 lb 13.4 oz (116.5 kg) (10/22 0427)  Pre op weight 118 kg Current Weight  01/08/14 256 lb 13.4 oz (116.5 kg)      Intake/Output from previous day: 10/21 0701 - 10/22 0700 In: 963 [P.O.:960; I.V.:3] Out: 850 [Urine:850]   Physical Exam:  Cardiovascular: RRR, no murmurs, gallops, or rubs. Pulmonary: Clear to auscultation bilaterally; no rales, wheezes, or rhonchi. Abdomen: Soft, non tender, bowel sounds present. Extremities: Trace bilateral lower extremity edema. Wounds: Clean and dry.  No erythema or signs of infection.  Lab Results: CBC:  Recent Labs  01/06/14 0300 01/07/14 0508  WBC 8.6 9.0  HGB 7.3* 8.2*  HCT 21.9* 25.2*  PLT 194 264   BMET:   Recent Labs  01/06/14 0300 01/07/14 0508  NA 134* 138  K 3.7 4.6  CL 92* 96  CO2 31 30  GLUCOSE 140* 166*  BUN 19 21  CREATININE 0.87 0.90  CALCIUM 8.5 8.7    PT/INR:  Lab Results  Component Value Date   INR 1.30 01/03/2014   INR 1.08 01/02/2014   ABG:  INR: Will add last result for INR, ABG once components are confirmed Will add last 4 CBG results once components are confirmed  Assessment/Plan:  1. CV - Previous a fib. Maintaining SR  in the 80's. On Amiodarone 400 bid,Lopressor 25 bid. Will restart low dose Lisinopril for better bp control. As discussed with Dr. Donata ClayVan Trigt, no need to restart Plavix. 2.  Pulmonary - Encourage incentive spirometer. 3. Volume Overload - On Lasix 40 daily 4.  Acute blood loss anemia - H and H up to 8.2 and 25.2. Continue Trinsicon. 5. DM-CBGs 218/93/207. Pre op HGA1C 7.2. On Metformin and Insulin. On Metformin and Victoza pre op and will be discharged on that. 6. Remove sutures  7. Discharge   Ghazal Pevey MPA-C 01/08/2014,7:30 AM

## 2014-01-08 NOTE — Progress Notes (Signed)
Ed finished/reviewed with pt. Voiced understanding. Ready to go home. Will borrow his neighbors RW. [phone] Ethelda ChickKristan Zhion Pevehouse CES, ACSM 10:05 AM 01/08/2014

## 2014-01-08 NOTE — Progress Notes (Signed)
Pt given AVS handout; verbalized understanding of discharge instructions. IV discontinued; pt tolerated well. No change in assessment noted; VSS. Pt escorted out via wheelchair.

## 2014-01-13 ENCOUNTER — Telehealth: Payer: Self-pay | Admitting: *Deleted

## 2014-01-13 DIAGNOSIS — G8918 Other acute postprocedural pain: Secondary | ICD-10-CM

## 2014-01-13 MED ORDER — HYDROMORPHONE HCL 2 MG PO TABS: 2.0000 mg | ORAL_TABLET | ORAL | Status: DC | PRN

## 2014-01-13 NOTE — Discharge Summary (Signed)
patient examined and medical record reviewed,agree with above note. [name],PETER 01/13/2014   

## 2014-01-13 NOTE — Telephone Encounter (Signed)
[name] was discharged approx. one week ago s/p heart surgery.  A fentanyl patch was placed the day of discharge.  It is now wearing off and he is requesting more of them.  He said Jake Castro said for him to just call the office.  Oxycodone makes him clammy and nauseous. I informed Jake Castro of his request.  He said no further patches would be prescribed, but he would order Dilaudid.  I informed [name] of his decision.  Even though this sometimes makes him nauseous he would come to the office for the prescription tomorrow.

## 2014-01-16 ENCOUNTER — Ambulatory Visit (INDEPENDENT_AMBULATORY_CARE_PROVIDER_SITE_OTHER): Payer: Self-pay | Admitting: Cardiothoracic Surgery

## 2014-01-16 ENCOUNTER — Other Ambulatory Visit: Payer: Self-pay | Admitting: Cardiothoracic Surgery

## 2014-01-16 ENCOUNTER — Encounter: Payer: Self-pay | Admitting: Cardiothoracic Surgery

## 2014-01-16 VITALS — BP 118/73 | HR 91 | Temp 97.2°F | Resp 19 | Ht 70.0 in | Wt 250.0 lb

## 2014-01-16 DIAGNOSIS — E119 Type 2 diabetes mellitus without complications: Secondary | ICD-10-CM | POA: Insufficient documentation

## 2014-01-16 DIAGNOSIS — S8011XA Contusion of right lower leg, initial encounter: Secondary | ICD-10-CM

## 2014-01-16 DIAGNOSIS — E11628 Type 2 diabetes mellitus with other skin complications: Secondary | ICD-10-CM

## 2014-01-16 DIAGNOSIS — I2510 Atherosclerotic heart disease of native coronary artery without angina pectoris: Secondary | ICD-10-CM

## 2014-01-16 NOTE — Progress Notes (Signed)
PCP is Dorothey BasemanBRONSTEIN,[name], MD Referring Provider is Lamar BlinksKowalski, Bruce J, MD  Chief Complaint  Patient presents with  . Follow-up    redness in leg, s/p CABG 01/03/14    HPI: Patient presents for wound check of his right leg at the Endo vein harvest site. It is warm red and swollen, tender. He is a 245 pound obese diabetic who underwent emergency CABG for left main stenosis 10 days ago. He denies fever. His had no recurrent symptoms of angina or CHF. The sternal incision is healing appropriately.  Under sterile prep and local anesthesia the liquefied hematoma was at first aspirated without success. Then a small incision was made and the Endo vein tunnel with the liquefied hematoma was cultured and drained of all blood. A tunnel was packed with a iodoform gauze strip and a Kerlix dressing around the leg applied.  We will start the patient on empiric Keflex and start daily packing by home health nurses of the endovein tunnel.   Past Medical History  Diagnosis Date  . CAD (coronary artery disease)     a. 12/2006 NSTEMI/PCI: RCA 3535m/d->mid vessel stented, unable to cross dist dzs w/ balloon;  b. 11/2013 NL MV;  c. 11/2013 Echo: EF 55%;  d. 12/2013 Cath: LM 95 ost, LAD 70p, 5263m, LCX 30p/m, OM1 60, RCA 20p, 9077m/83m, 95d.  Marland Kitchen. HTN (hypertension)   . Hyperlipidemia   . Diabetes mellitus   . Carotid arterial disease     a. s/p LCEA  . Morbid obesity   . Myocardial infarction   . Pneumonia     Past Surgical History  Procedure Laterality Date  . Coronary angioplasty    . Coronary artery bypass graft N/A 01/03/2014    Procedure: CORONARY ARTERY BYPASS GRAFTING (CABG) x four, using left internal mammary and right leg greater saphenous vein harvested endoscopically;  Surgeon: Kerin PernaPeter Van Trigt, MD;  [facility];  Service: Open Heart Surgery;  Laterality: N/A;  . Tee without cardioversion N/A 01/03/2014    Procedure: TRANSESOPHAGEAL ECHOCARDIOGRAM (TEE);  Surgeon: Kerin PernaPeter Van Trigt, MD;  [facility];   Service: Open Heart Surgery;  Laterality: N/A;    Family History  Problem Relation Age of Onset  . Peripheral vascular disease Father     died in his 7090's - had CEA.  . Peripheral vascular disease Brother     s/p CEA  . Stroke Brother     in his 7450's  . COPD Mother     died in her 1090's.    Social History History  Substance Use Topics  . Smoking status: Former Games developermoker  . Smokeless tobacco: Not on file     Comment: smoked for a few months in his early 20's.  . Alcohol Use: No    Current Outpatient Prescriptions  Medication Sig Dispense Refill  . ALPRAZolam (XANAX) 0.5 MG tablet Take 0.25-0.5 mg by mouth at bedtime as needed for anxiety or sleep.      Marland Kitchen. amiodarone (PACERONE) 200 MG tablet Take 2 tablets (400 mg total) by mouth 2 (two) times daily. For 2 days;then take Amiodarone 200 my by mouth two times daily thereafter  30 tablet  1  . Ascorbic Acid (VITAMIN C) 1000 MG tablet Take 1,000 mg by mouth daily.      Marland Kitchen. aspirin EC 325 MG tablet Take 1 tablet (325 mg total) by mouth every evening.      Marland Kitchen. atorvastatin (LIPITOR) 20 MG tablet Take 1 tablet (20 mg total) by mouth daily  at 6 PM.  30 tablet  1  . Cholecalciferol (VITAMIN D-3) 5000 UNITS TABS Take 5,000 mg by mouth daily.      . ferrous fumarate-b12-vitamic C-folic acid (TRINSICON / FOLTRIN) capsule Take 1 capsule by mouth daily.  30 capsule  0  . HYDROmorphone (DILAUDID) 2 MG tablet Take 1 tablet (2 mg total) by mouth every 4 (four) hours as needed for severe pain.  40 tablet  0  . KRILL OIL PO Take 1 tablet by mouth daily.      . Liraglutide (VICTOZA) 18 MG/3ML SOPN Inject 1.8 mg into the skin daily.      Marland Kitchen. lisinopril (PRINIVIL,ZESTRIL) 5 MG tablet Take 1 tablet (5 mg total) by mouth daily.  30 tablet  1  . metFORMIN (GLUCOPHAGE) 1000 MG tablet Take 1,000 mg by mouth 2 (two) times daily with a meal.      . metoprolol tartrate (LOPRESSOR) 25 MG tablet Take 1 tablet (25 mg total) by mouth 2 (two) times daily.  60 tablet  1  .  oxyCODONE (OXY IR/ROXICODONE) 5 MG immediate release tablet Take 1-2 tablets (5-10 mg total) by mouth every 4 (four) hours as needed for severe pain.  30 tablet  0  . VITAMIN K PO Take 1 tablet by mouth daily.       No current facility-administered medications for this visit.    Allergies  Allergen Reactions  . Kiwi Extract Nausea And Vomiting and Other (See Comments)    Throat bumps on throat     Review of Systems denies fever. States his blood sugars are well-controlled.  BP 118/73  Pulse 91  Temp(Src) 97.2 F (36.2 C)  Resp 19  Ht 5\' 10"  (1.778 m)  Wt 250 lb (113.399 kg)  BMI 35.87 kg/m2  SpO2 96% Physical Exam Alert and appropriate Lungs clear Sternal incision well-healed Heart rhythm regular Mild ankle edema Right pretibial medial erythema and fluctuant fluid collection which was incised and drained found to be old liquefied hematoma and packed with iodoform gauze. A culture was taken in the office.  Diagnostic Tests: None  Impression: Hematoma right leg endo- vein tunnel below the knee, probable cellulitis associated.  Plan: Oral antibiotics we started-Keflex 500 3 times a day Home health nurse will start daily packing of the wound with iodoform gauze stripping. He'll return for a wound check at the office Monday afternoon clinic November 2. Cultures should be reported by then.

## 2014-01-19 ENCOUNTER — Ambulatory Visit (INDEPENDENT_AMBULATORY_CARE_PROVIDER_SITE_OTHER): Payer: Self-pay | Admitting: Physician Assistant

## 2014-01-19 VITALS — BP 112/68 | HR 92 | Resp 20 | Ht 70.0 in | Wt 250.0 lb

## 2014-01-19 DIAGNOSIS — G8918 Other acute postprocedural pain: Secondary | ICD-10-CM

## 2014-01-19 DIAGNOSIS — L03115 Cellulitis of right lower limb: Secondary | ICD-10-CM

## 2014-01-19 LAB — WOUND CULTURE
Gram Stain: NONE SEEN
Gram Stain: NONE SEEN
Organism ID, Bacteria: NO GROWTH

## 2014-01-19 MED ORDER — HYDROMORPHONE HCL 2 MG PO TABS
2.0000 mg | ORAL_TABLET | ORAL | Status: DC | PRN
Start: 2014-01-19 — End: 2014-01-26

## 2014-01-19 NOTE — Progress Notes (Signed)
[address]       [place] [id number]             [phone]          HPI: Patient returns for a wound check. He is status post emergency CABG from 01/03/2014 by Dr. Donata ClayVan Trigt.  He was seen by Dr. Donata ClayVan Trigt on 10/30 with a right leg EVH site hematoma and cellulitis.  The hematoma was I&D'ed in the office and the wound was packed with iodoform gauze.  He was started on Keflex and home health was arranged for daily dressing changes.    The patient reports having a lot of pain and drainage of dark blood with packing and is taking Dilaudid 4 mg, for which he needs a refill.  He continues to have right leg swelling and tenderness, but denies fever or purulent drainage.    Current Outpatient Prescriptions  Medication Sig Dispense Refill  . ALPRAZolam (XANAX) 0.5 MG tablet Take 0.25-0.5 mg by mouth at bedtime as needed for anxiety or sleep.    Marland Kitchen. amiodarone (PACERONE) 200 MG tablet Take 2 tablets (400 mg total) by mouth 2 (two) times daily. For 2 days;then take Amiodarone 200 my by mouth two times daily thereafter 30 tablet 1  . Ascorbic Acid (VITAMIN C) 1000 MG tablet Take 1,000 mg by mouth daily.    Marland Kitchen. aspirin EC 325 MG tablet Take 1 tablet (325 mg total) by mouth every evening.    Marland Kitchen. atorvastatin (LIPITOR) 20 MG tablet Take 1 tablet (20 mg total) by mouth daily at 6 PM. 30 tablet 1  . Cholecalciferol (VITAMIN D-3) 5000 UNITS TABS Take 5,000 mg by mouth daily.    . ferrous fumarate-b12-vitamic C-folic acid (TRINSICON / FOLTRIN) capsule Take 1 capsule by mouth daily. 30 capsule 0  . HYDROmorphone (DILAUDID) 2 MG tablet Take 1 tablet (2 mg total) by mouth every 4 (four) hours as needed for severe pain. 40 tablet 0  . KRILL OIL PO Take 1 tablet by mouth daily.    . Liraglutide (VICTOZA) 18 MG/3ML SOPN Inject 1.8 mg into the skin daily.    Marland Kitchen. lisinopril (PRINIVIL,ZESTRIL) 5 MG tablet Take 1 tablet (5 mg total) by mouth daily. 30 tablet 1  . metFORMIN (GLUCOPHAGE) 1000 MG  tablet Take 1,000 mg by mouth 2 (two) times daily with a meal.    . metoprolol tartrate (LOPRESSOR) 25 MG tablet Take 1 tablet (25 mg total) by mouth 2 (two) times daily. 60 tablet 1  . oxyCODONE (OXY IR/ROXICODONE) 5 MG immediate release tablet Take 1-2 tablets (5-10 mg total) by mouth every 4 (four) hours as needed for severe pain. 30 tablet 0  . VITAMIN K PO Take 1 tablet by mouth daily.     No current facility-administered medications for this visit.     Physical Exam: BP 112/68 HR 92 Resp 20 Wounds: R lower leg site is repacked with iodoform gauze, some old dark blood expressed but no purulence Heart: regular rate and rhythm Lungs: Clear Extremities: + RLE edema, RLE erythema, although the redness has improved from his last visit based on the marks made surrounding the area   Diagnostic Tests: Chest xray: No results found.     Assessment/Plan: Right leg hematoma and cellulitis- The patient has now completed 2 full days of antibiotics with mild improvement in the erythema.  There is no gross purulence. I have discussed the possibility of hospital admission for IV antibiotics  vs continued home wound care and giving the po antibiotics a few more days to work, and he does not want to be admitted at this time.  I think since he has only had a few doses of Keflex and there is some improvement that it is reasonable to continue care at home and recheck the leg again in Dr. Zenaida NieceVan Trigt's office on Wednesday.  He knows to call in the interim if his condition worsens or changes.  He also is aware that if he does not improve, he will likely need admission.  I have also given him refill on Dilaudid 2 mg #40.

## 2014-01-21 ENCOUNTER — Ambulatory Visit (INDEPENDENT_AMBULATORY_CARE_PROVIDER_SITE_OTHER): Payer: Self-pay | Admitting: Cardiothoracic Surgery

## 2014-01-21 ENCOUNTER — Encounter: Payer: Self-pay | Admitting: Cardiothoracic Surgery

## 2014-01-21 ENCOUNTER — Inpatient Hospital Stay (HOSPITAL_COMMUNITY)
Admission: AD | Admit: 2014-01-21 | Discharge: 2014-01-26 | DRG: 863 | Disposition: A | Discharge status: Home Health | Payer: Medicare PPO | Source: Ambulatory Visit | Attending: Cardiothoracic Surgery | Admitting: Cardiothoracic Surgery

## 2014-01-21 VITALS — BP 97/63 | HR 79 | Ht 70.0 in | Wt 250.0 lb

## 2014-01-21 DIAGNOSIS — L02419 Cutaneous abscess of limb, unspecified: Secondary | ICD-10-CM

## 2014-01-21 DIAGNOSIS — Z951 Presence of aortocoronary bypass graft: Secondary | ICD-10-CM

## 2014-01-21 DIAGNOSIS — Z955 Presence of coronary angioplasty implant and graft: Secondary | ICD-10-CM

## 2014-01-21 DIAGNOSIS — T814XXA Infection following a procedure, initial encounter: Principal | ICD-10-CM | POA: Diagnosis present

## 2014-01-21 DIAGNOSIS — Z7982 Long term (current) use of aspirin: Secondary | ICD-10-CM

## 2014-01-21 DIAGNOSIS — E119 Type 2 diabetes mellitus without complications: Secondary | ICD-10-CM | POA: Diagnosis present

## 2014-01-21 DIAGNOSIS — Z87891 Personal history of nicotine dependence: Secondary | ICD-10-CM | POA: Diagnosis not present

## 2014-01-21 DIAGNOSIS — I2510 Atherosclerotic heart disease of native coronary artery without angina pectoris: Secondary | ICD-10-CM

## 2014-01-21 DIAGNOSIS — I10 Essential (primary) hypertension: Secondary | ICD-10-CM | POA: Diagnosis present

## 2014-01-21 DIAGNOSIS — Y838 Other surgical procedures as the cause of abnormal reaction of the patient, or of later complication, without mention of misadventure at the time of the procedure: Secondary | ICD-10-CM | POA: Diagnosis present

## 2014-01-21 DIAGNOSIS — M79661 Pain in right lower leg: Secondary | ICD-10-CM | POA: Diagnosis present

## 2014-01-21 DIAGNOSIS — Z79899 Other long term (current) drug therapy: Secondary | ICD-10-CM | POA: Diagnosis not present

## 2014-01-21 DIAGNOSIS — L03119 Cellulitis of unspecified part of limb: Secondary | ICD-10-CM

## 2014-01-21 DIAGNOSIS — Z6835 Body mass index (BMI) 35.0-35.9, adult: Secondary | ICD-10-CM | POA: Diagnosis not present

## 2014-01-21 DIAGNOSIS — T8149XA Infection following a procedure, other surgical site, initial encounter: Secondary | ICD-10-CM | POA: Diagnosis present

## 2014-01-21 DIAGNOSIS — L7622 Postprocedural hemorrhage and hematoma of skin and subcutaneous tissue following other procedure: Secondary | ICD-10-CM | POA: Diagnosis present

## 2014-01-21 DIAGNOSIS — D62 Acute posthemorrhagic anemia: Secondary | ICD-10-CM | POA: Diagnosis present

## 2014-01-21 DIAGNOSIS — L03115 Cellulitis of right lower limb: Secondary | ICD-10-CM | POA: Diagnosis present

## 2014-01-21 DIAGNOSIS — G8918 Other acute postprocedural pain: Secondary | ICD-10-CM

## 2014-01-21 DIAGNOSIS — I252 Old myocardial infarction: Secondary | ICD-10-CM | POA: Diagnosis not present

## 2014-01-21 DIAGNOSIS — E8770 Fluid overload, unspecified: Secondary | ICD-10-CM | POA: Diagnosis present

## 2014-01-21 DIAGNOSIS — E785 Hyperlipidemia, unspecified: Secondary | ICD-10-CM | POA: Diagnosis present

## 2014-01-21 LAB — CBC
HEMATOCRIT: 29.2 % — AB (ref 39.0–52.0)
Hemoglobin: 8.9 g/dL — ABNORMAL LOW (ref 13.0–17.0)
MCH: 26.6 pg (ref 26.0–34.0)
MCHC: 30.5 g/dL (ref 30.0–36.0)
MCV: 87.2 fL (ref 78.0–100.0)
Platelets: 698 10*3/uL — ABNORMAL HIGH (ref 150–400)
RBC: 3.35 MIL/uL — ABNORMAL LOW (ref 4.22–5.81)
RDW: 14.9 % (ref 11.5–15.5)
WBC: 10.2 10*3/uL (ref 4.0–10.5)

## 2014-01-21 LAB — GLUCOSE, CAPILLARY
Glucose-Capillary: 123 mg/dL — ABNORMAL HIGH (ref 70–99)
Glucose-Capillary: 148 mg/dL — ABNORMAL HIGH (ref 70–99)

## 2014-01-21 LAB — BASIC METABOLIC PANEL
ANION GAP: 15 (ref 5–15)
BUN: 29 mg/dL — ABNORMAL HIGH (ref 6–23)
CHLORIDE: 94 meq/L — AB (ref 96–112)
CO2: 26 mEq/L (ref 19–32)
Calcium: 9.5 mg/dL (ref 8.4–10.5)
Creatinine, Ser: 1.64 mg/dL — ABNORMAL HIGH (ref 0.50–1.35)
GFR calc Af Amer: 49 mL/min — ABNORMAL LOW (ref >90)
GFR calc non Af Amer: 42 mL/min — ABNORMAL LOW (ref >90)
Glucose, Bld: 121 mg/dL — ABNORMAL HIGH (ref 70–99)
Potassium: 5.6 mEq/L — ABNORMAL HIGH (ref 3.7–5.3)
Sodium: 135 mEq/L — ABNORMAL LOW (ref 137–147)

## 2014-01-21 LAB — PRO B NATRIURETIC PEPTIDE: Pro B Natriuretic peptide (BNP): 805.1 pg/mL — ABNORMAL HIGH (ref 0–125)

## 2014-01-21 MED ORDER — ONDANSETRON HCL 4 MG/2ML IJ SOLN: 4.0000 mg | Freq: Four times a day (QID) | INTRAMUSCULAR | Status: DC | PRN

## 2014-01-21 MED ORDER — FUROSEMIDE 40 MG PO TABS
40.0000 mg | ORAL_TABLET | Freq: Every day | ORAL | Status: DC
  Administered 2014-01-22 – 2014-01-26 (×5): 40 mg via ORAL
  Filled 2014-01-21 (×6): qty 1

## 2014-01-21 MED ORDER — SODIUM CHLORIDE 0.9 % IV SOLN
250.0000 mL | INTRAVENOUS | Status: DC | PRN
  Administered 2014-01-24: 250 mL via INTRAVENOUS

## 2014-01-21 MED ORDER — ONDANSETRON HCL 4 MG PO TABS: 4.0000 mg | ORAL_TABLET | Freq: Four times a day (QID) | ORAL | Status: DC | PRN

## 2014-01-21 MED ORDER — ACETAMINOPHEN 650 MG RE SUPP: 650.0000 mg | Freq: Four times a day (QID) | RECTAL | Status: DC | PRN

## 2014-01-21 MED ORDER — HYDROMORPHONE HCL 2 MG PO TABS
2.0000 mg | ORAL_TABLET | ORAL | Status: DC | PRN
Start: 2014-01-21 — End: 2014-01-26
  Administered 2014-01-21 – 2014-01-26 (×20): 2 mg via ORAL
  Filled 2014-01-21 (×21): qty 1

## 2014-01-21 MED ORDER — SODIUM CHLORIDE 0.9 % IJ SOLN: 3.0000 mL | INTRAMUSCULAR | Status: DC | PRN

## 2014-01-21 MED ORDER — FENTANYL 25 MCG/HR TD PT72: 25.0000 ug | MEDICATED_PATCH | TRANSDERMAL | Status: DC

## 2014-01-21 MED ORDER — ASPIRIN EC 325 MG PO TBEC
325.0000 mg | DELAYED_RELEASE_TABLET | Freq: Every evening | ORAL | Status: DC
Start: 2014-01-21 — End: 2014-01-26
  Administered 2014-01-21 – 2014-01-25 (×5): 325 mg via ORAL
  Filled 2014-01-21 (×6): qty 1

## 2014-01-21 MED ORDER — INSULIN ASPART 100 UNIT/ML ~~LOC~~ SOLN
0.0000 [IU] | Freq: Three times a day (TID) | SUBCUTANEOUS | Status: DC
  Administered 2014-01-21: 2 [IU] via SUBCUTANEOUS
  Administered 2014-01-22 (×2): 3 [IU] via SUBCUTANEOUS
  Administered 2014-01-22: 2 [IU] via SUBCUTANEOUS
  Administered 2014-01-23: 5 [IU] via SUBCUTANEOUS
  Administered 2014-01-23: 2 [IU] via SUBCUTANEOUS
  Administered 2014-01-23: 5 [IU] via SUBCUTANEOUS
  Administered 2014-01-24: 3 [IU] via SUBCUTANEOUS
  Administered 2014-01-24: 5 [IU] via SUBCUTANEOUS
  Administered 2014-01-24: 3 [IU] via SUBCUTANEOUS
  Administered 2014-01-25: 8 [IU] via SUBCUTANEOUS
  Administered 2014-01-25: 5 [IU] via SUBCUTANEOUS
  Administered 2014-01-25: 3 [IU] via SUBCUTANEOUS

## 2014-01-21 MED ORDER — ACETAMINOPHEN 325 MG PO TABS
650.0000 mg | ORAL_TABLET | Freq: Four times a day (QID) | ORAL | Status: DC | PRN
  Filled 2014-01-21: qty 2

## 2014-01-21 MED ORDER — FUROSEMIDE 40 MG PO TABS
40.0000 mg | ORAL_TABLET | Freq: Every day | ORAL | Status: DC
  Filled 2014-01-21: qty 1

## 2014-01-21 MED ORDER — ALPRAZOLAM 0.25 MG PO TABS: 0.2500 mg | ORAL_TABLET | Freq: Every evening | ORAL | Status: DC | PRN

## 2014-01-21 MED ORDER — METOPROLOL TARTRATE 25 MG PO TABS
25.0000 mg | ORAL_TABLET | Freq: Two times a day (BID) | ORAL | Status: DC
  Administered 2014-01-22 – 2014-01-26 (×9): 25 mg via ORAL
  Filled 2014-01-21 (×11): qty 1

## 2014-01-21 MED ORDER — DOCUSATE SODIUM 100 MG PO CAPS
100.0000 mg | ORAL_CAPSULE | Freq: Every day | ORAL | Status: DC
  Administered 2014-01-21 – 2014-01-26 (×6): 100 mg via ORAL
  Filled 2014-01-21 (×7): qty 1

## 2014-01-21 MED ORDER — FENTANYL 25 MCG/HR TD PT72
25.0000 ug | MEDICATED_PATCH | TRANSDERMAL | Status: DC
  Administered 2014-01-21 – 2014-01-24 (×2): 25 ug via TRANSDERMAL
  Filled 2014-01-21 (×2): qty 1

## 2014-01-21 MED ORDER — OXYCODONE HCL 5 MG PO TABS: 5.0000 mg | ORAL_TABLET | ORAL | Status: DC | PRN

## 2014-01-21 MED ORDER — FE FUMARATE-B12-VIT C-FA-IFC PO CAPS
1.0000 | ORAL_CAPSULE | Freq: Every day | ORAL | Status: DC
  Administered 2014-01-22 – 2014-01-26 (×5): 1 via ORAL
  Filled 2014-01-21 (×6): qty 1

## 2014-01-21 MED ORDER — ALUM & MAG HYDROXIDE-SIMETH 200-200-20 MG/5ML PO SUSP: 30.0000 mL | Freq: Four times a day (QID) | ORAL | Status: DC | PRN

## 2014-01-21 MED ORDER — SODIUM CHLORIDE 0.9 % IJ SOLN
3.0000 mL | Freq: Two times a day (BID) | INTRAMUSCULAR | Status: DC
  Administered 2014-01-21 – 2014-01-25 (×7): 3 mL via INTRAVENOUS

## 2014-01-21 MED ORDER — PIPERACILLIN-TAZOBACTAM 3.375 G IVPB
3.3750 g | Freq: Three times a day (TID) | INTRAVENOUS | Status: DC
  Administered 2014-01-22 – 2014-01-26 (×13): 3.375 g via INTRAVENOUS
  Filled 2014-01-21 (×16): qty 50

## 2014-01-21 MED ORDER — DOCUSATE SODIUM 100 MG PO CAPS
100.0000 mg | ORAL_CAPSULE | Freq: Every day | ORAL | Status: DC
  Filled 2014-01-21: qty 1

## 2014-01-21 MED ORDER — PIPERACILLIN-TAZOBACTAM 3.375 G IVPB
3.3750 g | Freq: Four times a day (QID) | INTRAVENOUS | Status: DC
  Administered 2014-01-21 (×2): 3.375 g via INTRAVENOUS
  Filled 2014-01-21 (×3): qty 50

## 2014-01-21 MED ORDER — LIRAGLUTIDE 18 MG/3ML ~~LOC~~ SOPN: 1.8000 mg | PEN_INJECTOR | Freq: Every day | SUBCUTANEOUS | Status: DC

## 2014-01-21 MED ORDER — ATORVASTATIN CALCIUM 20 MG PO TABS
20.0000 mg | ORAL_TABLET | Freq: Every day | ORAL | Status: DC
  Administered 2014-01-21 – 2014-01-25 (×5): 20 mg via ORAL
  Filled 2014-01-21 (×6): qty 1

## 2014-01-21 MED ORDER — LISINOPRIL 5 MG PO TABS
5.0000 mg | ORAL_TABLET | Freq: Every day | ORAL | Status: DC
  Filled 2014-01-21 (×2): qty 1

## 2014-01-21 MED ORDER — VANCOMYCIN HCL IN DEXTROSE 1-5 GM/200ML-% IV SOLN
1000.0000 mg | Freq: Two times a day (BID) | INTRAVENOUS | Status: DC
  Administered 2014-01-21 – 2014-01-26 (×10): 1000 mg via INTRAVENOUS
  Filled 2014-01-21 (×11): qty 200

## 2014-01-21 MED ORDER — AMIODARONE HCL 200 MG PO TABS
200.0000 mg | ORAL_TABLET | Freq: Two times a day (BID) | ORAL | Status: DC
  Administered 2014-01-21 – 2014-01-26 (×10): 200 mg via ORAL
  Filled 2014-01-21 (×11): qty 1

## 2014-01-21 MED ORDER — ALPRAZOLAM 0.25 MG PO TABS
0.2500 mg | ORAL_TABLET | Freq: Every evening | ORAL | Status: DC | PRN
  Administered 2014-01-22 – 2014-01-25 (×4): 0.5 mg via ORAL
  Filled 2014-01-21 (×4): qty 2

## 2014-01-21 MED ORDER — METFORMIN HCL 500 MG PO TABS
1000.0000 mg | ORAL_TABLET | Freq: Two times a day (BID) | ORAL | Status: DC
  Administered 2014-01-21 – 2014-01-22 (×2): 1000 mg via ORAL
  Filled 2014-01-21 (×4): qty 2

## 2014-01-21 MED ORDER — POTASSIUM CHLORIDE CRYS ER 20 MEQ PO TBCR
20.0000 meq | EXTENDED_RELEASE_TABLET | Freq: Every day | ORAL | Status: DC
Start: 2014-01-21 — End: 2014-01-22
  Filled 2014-01-21: qty 1

## 2014-01-21 NOTE — Progress Notes (Signed)
PCP is Dorothey BasemanBRONSTEIN,[name], MD Referring Provider is Lamar BlinksKowalski, Bruce J, MD  Chief Complaint  Patient presents with  . F/U THORACIC    F/U VISIT    HPI:1 week followup of right lower extremity cellulitis, probable infected hematoma of the Endo vein harvest tunnel. Sternotomy incision healing well. No recurrent symptoms of angina or CHF. Low-grade temperature. Has not responded well to oral antibiotic therapy and home health nursing wound care. Patient will need to be admitted to the hospital for progressive cellulitis, induration and erythema and warmth of the right lower extremity with a long tunnel with muco-sanguinous material which has been culture negative. He will need IV antibiotics and packing and perhaps further wound debridement and wound VAC therapy.  Past Medical History  Diagnosis Date  . CAD (coronary artery disease)     a. 12/2006 NSTEMI/PCI: RCA 7631m/d->mid vessel stented, unable to cross dist dzs w/ balloon;  b. 11/2013 NL MV;  c. 11/2013 Echo: EF 55%;  d. 12/2013 Cath: LM 95 ost, LAD 70p, 543m, LCX 30p/m, OM1 60, RCA 20p, 2476m/90m, 95d.  Marland Kitchen. HTN (hypertension)   . Hyperlipidemia   . Diabetes mellitus   . Carotid arterial disease     a. s/p LCEA  . Morbid obesity   . Myocardial infarction   . Pneumonia     Past Surgical History  Procedure Laterality Date  . Coronary angioplasty    . Coronary artery bypass graft N/A 01/03/2014    Procedure: CORONARY ARTERY BYPASS GRAFTING (CABG) x four, using left internal mammary and right leg greater saphenous vein harvested endoscopically;  Surgeon: Kerin PernaPeter Van Trigt, MD;  [facility];  Service: Open Heart Surgery;  Laterality: N/A;  . Tee without cardioversion N/A 01/03/2014    Procedure: TRANSESOPHAGEAL ECHOCARDIOGRAM (TEE);  Surgeon: Kerin PernaPeter Van Trigt, MD;  [facility];  Service: Open Heart Surgery;  Laterality: N/A;    Family History  Problem Relation Age of Onset  . Peripheral vascular disease Father     died in his 7890's - had  CEA.  . Peripheral vascular disease Brother     s/p CEA  . Stroke Brother     in his 8250's  . COPD Mother     died in her 3590's.    Social History History  Substance Use Topics  . Smoking status: Former Games developermoker  . Smokeless tobacco: Not on file     Comment: smoked for a few months in his early 20's.  . Alcohol Use: No    Current Outpatient Prescriptions  Medication Sig Dispense Refill  . ALPRAZolam (XANAX) 0.5 MG tablet Take 0.25-0.5 mg by mouth at bedtime as needed for anxiety or sleep.    Marland Kitchen. amiodarone (PACERONE) 200 MG tablet Take 2 tablets (400 mg total) by mouth 2 (two) times daily. For 2 days;then take Amiodarone 200 my by mouth two times daily thereafter 30 tablet 1  . Ascorbic Acid (VITAMIN C) 1000 MG tablet Take 1,000 mg by mouth daily.    Marland Kitchen. aspirin EC 325 MG tablet Take 1 tablet (325 mg total) by mouth every evening.    Marland Kitchen. atorvastatin (LIPITOR) 20 MG tablet Take 1 tablet (20 mg total) by mouth daily at 6 PM. 30 tablet 1  . Cholecalciferol (VITAMIN D-3) 5000 UNITS TABS Take 5,000 mg by mouth daily.    . ferrous fumarate-b12-vitamic C-folic acid (TRINSICON / FOLTRIN) capsule Take 1 capsule by mouth daily. 30 capsule 0  . furosemide (LASIX) 20 MG tablet Take by mouth.    .Marland Kitchen  KRILL OIL PO Take 1 tablet by mouth daily.    . Liraglutide (VICTOZA) 18 MG/3ML SOPN Inject 1.8 mg into the skin daily.    Marland Kitchen. lisinopril (PRINIVIL,ZESTRIL) 5 MG tablet Take 1 tablet (5 mg total) by mouth daily. 30 tablet 1  . metFORMIN (GLUCOPHAGE) 1000 MG tablet Take 1,000 mg by mouth 2 (two) times daily with a meal.    . metoprolol tartrate (LOPRESSOR) 25 MG tablet Take 1 tablet (25 mg total) by mouth 2 (two) times daily. 60 tablet 1  . VITAMIN K PO Take 1 tablet by mouth daily.    Marland Kitchen. HYDROmorphone (DILAUDID) 2 MG tablet Take 1 tablet (2 mg total) by mouth every 4 (four) hours as needed for severe pain. 40 tablet 0  . oxyCODONE (OXY IR/ROXICODONE) 5 MG immediate release tablet Take 1-2 tablets (5-10 mg total)  by mouth every 4 (four) hours as needed for severe pain. 30 tablet 0   No current facility-administered medications for this visit.    Allergies  Allergen Reactions  . Kiwi Extract Nausea And Vomiting and Other (See Comments)    Throat bumps on throat     Review of Systemslow-grade fever , Poor appetite  BP 97/63 mmHg  Pulse 79  Ht 5\' 10"  (1.778 m)  Wt 250 lb (113.399 kg)  BMI 35.87 kg/m2  SpO2 98% Physical Exam Alert and appropriate- depressed Lungs clear Sternum well-healed Heart rate regular without murmur Right lower extremity with erythema induration tenderness and a longsubcutaneous tunnel approximately 8 cm in length packed with iodoform  Impression:infected hematoma with cellulitis right lower extremity and the vein tunnel   Plan: Admit for IV antibiotics and wound care

## 2014-01-21 NOTE — Progress Notes (Addendum)
[address]            [place] [id number]          [phone]     Jake Castro is an 63 y.o. male. 18-Jul-1950 [id number]    History and Physical:   Chief Complaint: Cellulitis and infected hematoma of right EVH wound  History of Presenting Illness:  This is a [age] old Caucasian male with cardiac risk factors that include Mi 97-8 years ago), DM, hypertension, hyperlipidemia, and remote tobacco abuse who is s/p CABGx4 on 01/03/2014 by Dr. Donata ClayVan Castro. He was seen in the office by Dr. Donata ClayVan Castro on 01/16/2014 [facility] He opened the right leg wound and a liquefied hematoma was removed. The wound was cultured. He was started on Keflex 500 mg by mouth three times daily and home health was arranged to pack the wound daily with iodoform gauze. He returned to see the PA on 01/19/2014. He reported he still has swelling and tenderness of the right lower leg, but no fever or purulent drainage. It was discussed that he may need to be admitted for IV antibiotics and further wound care;however, he did not want to be admitted at that time. He presented to the office today for another wound check with Dr. Donata ClayVan Castro. He has had a low grade fever, induration, warmth and erythema of the right EVH wound. He has not responded well to oral antibiotic therapy and local wound care. He is being admitted for more agressive wound care and IV antibiotics.  Past Medical History: Past Medical History  Diagnosis Date  . CAD (coronary artery disease)     a. 12/2006 NSTEMI/PCI: RCA 1314m/d->mid vessel stented, unable to cross dist dzs w/ balloon;  b. 11/2013 NL MV;  c. 11/2013 Echo: EF 55%;  d. 12/2013 Cath: LM 95 ost, LAD 70p, 7436m, LCX 30p/Castro, OM1 60, RCA 20p, 7253m/31m, 95d.  Marland Kitchen. HTN (hypertension)   . Hyperlipidemia   . Diabetes mellitus   . Carotid arterial disease     a. s/p LCEA  . Morbid obesity   . Myocardial infarction   . Pneumonia     Past Surgical  History: Past Surgical History  Procedure Laterality Date  . Coronary angioplasty    . Coronary artery bypass graft N/A 01/03/2014    Procedure: CORONARY ARTERY BYPASS GRAFTING (CABG) x four, using left internal mammary and right leg greater saphenous vein harvested endoscopically;  Surgeon: Jake PernaPeter Jake Trigt, MD;  [facility];  Service: Open Heart Surgery;  Laterality: N/A;  . Tee without cardioversion N/A 01/03/2014    Procedure: TRANSESOPHAGEAL ECHOCARDIOGRAM (TEE);  Surgeon: Jake PernaPeter Jake Trigt, MD;  [facility];  Service: Open Heart Surgery;  Laterality: N/A;    Family History: Family History  Problem Relation Age of Onset  . Peripheral vascular disease Father     died in his 190's - had CEA.  . Peripheral vascular disease Brother     s/p CEA  . Stroke Brother     in his 6750's  . COPD Mother     died in her 2890's.     Social History:   reports that he has quit smoking. He does not have any smokeless tobacco history on file. He reports that he does not drink alcohol or use illicit drugs.  Allergies:  Allergies  Allergen Reactions  .  Kiwi Extract Nausea And Vomiting and Other (See Comments)    Throat bumps on throat     No prescriptions prior to admission     Review of Systems:  Cardiac Review of Systems: Y or N  Chest Pain [ N ] Resting SOB [ N ] Exertional SOB [ N ] Orthopnea [name][N ] Palpitations [name][N ] Syncope [name][N ] Presyncope [ N ]  General Review of Systems: [Y] = yes [N ]=no  Constitional: recent weight change [name][N ]; anorexia [name][N ]; fatigue [Y ]; nausea [ N]; night sweats [ N]; fever [Y ]; or chills [name][N ];  Eye : blurred vision [ N]; diplopia [name][N ]; vision changes [name][N ]; Amaurosis fugax[ N];  Resp: cough [name][N ]; wheezing[N ]; hemoptysis[N ]; shortness of breath[ N]; paroxysmal nocturnal dyspnea[ ]  GI: vomiting[N ]; dysphagia[N ]; melena[ N]; hematochezia [ N];  GU: hematuria[ N]; dysuria [name][N ]; nocturia[N ];  Skin: rash, swelling[Y ];, hair loss[ N]; peripheral edema[Y ]; or  itching[ N];  Musculosketetal: myalgias[ N]; joint swellingN[ ] ; joint erythemaN[ ] ;  joint pain[N ]; back pain[ N];  Heme/Lymph: bruising[N ]; bleeding[N ]; anemia[Y ];  Neuro: TIA[ N]; headaches[ N]; stroke[N ]; vertigo[N ]; seizures[ N]; paresthesias[ ] ; difficulty walking[N ];  Psych:depression[N ]; anxiety[N ];  Endocrine: diabetes[Y ]; thyroid dysfunction[N ];    Vital Signs: Physical Exam: General appearance: alert, cooperative and no distress Neurologic: intact Heart: regular rate and rhythm and S1, S2 normal Lungs: clear to auscultation bilaterally Abdomen: soft, non-tender; bowel sounds normal; no masses,  no organomegaly Extremities: + LE edema R>L Wound: right leg evh site has been I+D'd , some suerficial erethema and associated edema.  Heent: pharynx clear of exudates or erethema Neck: no adenopathy  Diagnostic Studies and Laboratory Results: No results found for this or any previous visit (from the past 48 hour(s)). No results found.   Assessment/Plan 1.Cellulitis and infected hematoma of right EVH wound. Will be placed on both IV Vancomycin and Zosyn. Wet to dry dressing changes daily to right EVH wound. 2.S/p CABG x 4 on 01/03/2014 3. DM-continue Metformin and Victoza as taken pre op. Glucose checks and SS PRN  Jake RotaZIMMERMAN,[name], PA-C 01/21/2014, 2:23 PM  Patient needs hospitalization for IV antibiotics and manage ment of complex wound after urgent CABG  patient examined and medical record reviewed,agree with above note. Jake Castro 01/21/2014

## 2014-01-22 ENCOUNTER — Encounter (HOSPITAL_COMMUNITY): Payer: Self-pay | Admitting: General Practice

## 2014-01-22 LAB — GLUCOSE, CAPILLARY
Glucose-Capillary: 158 mg/dL — ABNORMAL HIGH (ref 70–99)
Glucose-Capillary: 173 mg/dL — ABNORMAL HIGH (ref 70–99)
Glucose-Capillary: 150 mg/dL — ABNORMAL HIGH (ref 70–99)
Glucose-Capillary: 141 mg/dL — ABNORMAL HIGH (ref 70–99)

## 2014-01-22 MED ORDER — ENOXAPARIN SODIUM 40 MG/0.4ML ~~LOC~~ SOLN
40.0000 mg | SUBCUTANEOUS | Status: DC
  Administered 2014-01-22 – 2014-01-26 (×5): 40 mg via SUBCUTANEOUS
  Filled 2014-01-22 (×5): qty 0.4

## 2014-01-22 MED ORDER — GENTAMICIN SULFATE 0.1 % EX CREA: TOPICAL_CREAM | Freq: Every day | CUTANEOUS | Status: DC | PRN

## 2014-01-22 MED ORDER — GENTAMICIN SULFATE 0.1 % EX OINT
1.0000 "application " | TOPICAL_OINTMENT | Freq: Every day | CUTANEOUS | Status: DC | PRN
  Administered 2014-01-23: 1 via TOPICAL
  Filled 2014-01-22: qty 15

## 2014-01-22 MED ORDER — FENTANYL CITRATE 0.05 MG/ML IJ SOLN
25.0000 ug | Freq: Once | INTRAMUSCULAR | Status: AC
  Administered 2014-01-22: 25 ug via INTRAVENOUS
  Filled 2014-01-22: qty 2

## 2014-01-22 NOTE — Progress Notes (Signed)
PHARMACIST - PHYSICIAN COMMUNICATION DR:  Donata ClayVan Trigt CONCERNING:  METFORMIN SAFE ADMINISTRATION POLICY  RECOMMENDATION: Metformin has been placed on DISCONTINUE (rejected order) STATUS and should be reordered only after any of the conditions below are ruled out.  Current safety recommendations include avoiding metformin for a minimum of 48 hours after the patient's exposure to intravenous contrast media.  DESCRIPTION:  The Pharmacy Committee has adopted a policy that restricts the use of metformin in hospitalized patients until all the contraindications to administration have been ruled out. Specific contraindications are: [x]  Serum creatinine ? 1.5 for males []  Serum creatinine ? 1.4 for females []  Shock, acute MI, sepsis, hypoxemia, dehydration []  Planned administration of intravenous iodinated contrast media []  Heart Failure patients with low EF []  Acute or chronic metabolic acidosis (including DKA)     Loura BackJennifer [place], [address] BoulevardPharm.D., BCPS Clinical Pharmacist Pager: [phone] 01/22/2014 8:38 AM

## 2014-01-22 NOTE — Progress Notes (Signed)
Utilization review completed. Krystl Wickware, RN, BSN. 

## 2014-01-22 NOTE — H&P (Signed)
Expand All Collapse All         [address]  [place]ake [place] [id number]  [phone]    Jake Castro is an 63 y.o. male. [birth year] BJY:[id number]   History and Physical:   Chief Complaint: Cellulitis and infected hematoma of right EVH wound  History of Presenting Illness:  This is a [age] old Caucasian male with cardiac risk factors that include Mi 97-8 years ago), DM, hypertension, hyperlipidemia, and remote tobacco abuse who is s/p CABGx4 on 01/03/2014 by Dr. Donata ClayVan Trigt. He was seen in the office by Dr. Donata ClayVan Trigt on 01/16/2014 [facility]. He opened the right leg wound and a liquefied hematoma was removed. The wound was cultured. He was started on Keflex 500 mg by mouth three times daily and home health was arranged to pack the wound daily with iodoform gauze. He returned to see the PA on 01/19/2014. He reported he still has swelling and tenderness of the right lower leg, but no fever or purulent drainage. It was discussed that he may need to be admitted for IV antibiotics and further wound care;however, he did not want to be admitted at that time. He presented to the office today for another wound check with Dr. Donata ClayVan Trigt. He has had a low grade fever, induration, warmth and erythema of the right EVH wound. He has not responded well to oral antibiotic therapy and local wound care. He is being admitted for more agressive wound care and IV antibiotics.  Past Medical History: Past Medical History  Diagnosis Date  . CAD (coronary artery disease)     a. 12/2006 NSTEMI/PCI: RCA 508m/d->mid vessel stented, unable to cross dist dzs w/ balloon; b. 11/2013 NL MV; c. 11/2013 Echo: EF 55%; d. 12/2013 Cath: LM 95 ost, LAD 70p, 3953m, LCX 30p/m, OM1 60, RCA 20p, 5470m/40m, 95d.  Marland Kitchen. HTN (hypertension)   . Hyperlipidemia   . Diabetes mellitus   . Carotid arterial disease     a.  s/p LCEA  . Morbid obesity   . Myocardial infarction   . Pneumonia     Past Surgical History: Past Surgical History  Procedure Laterality Date  . Coronary angioplasty    . Coronary artery bypass graft N/A 01/03/2014    Procedure: CORONARY ARTERY BYPASS GRAFTING (CABG) x four, using left internal mammary and right leg greater saphenous vein harvested endoscopically; Surgeon: Kerin PernaPeter Jake Trigt, MD; [facility]; Service: Open Heart Surgery; Laterality: N/A;  . Tee without cardioversion N/A 01/03/2014    Procedure: TRANSESOPHAGEAL ECHOCARDIOGRAM (TEE); Surgeon: Kerin PernaPeter Jake Trigt, MD; [facility]; Service: Open Heart Surgery; Laterality: N/A;    Family History: Family History  Problem Relation Age of Onset  . Peripheral vascular disease Father     died in his 5290's - had CEA.  . Peripheral vascular disease Brother     s/p CEA  . Stroke Brother     in his 4950's  . COPD Mother     died in her 3390's.     Social History:  reports that he has quit smoking. He does not have any smokeless tobacco history on file. He reports that he does not drink alcohol or use illicit drugs.  Allergies:  Allergies  Allergen Reactions  . Kiwi Extract Nausea And Vomiting and Other (See Comments)    Throat bumps on throat     No prescriptions prior to admission     Review of Systems:  Cardiac Review of Systems: YJeannie Fend  or N  Chest Pain [ N ] Resting SOB [ N ] Exertional SOB [ N ] Orthopnea [name][N ] Palpitations [name][N ] Syncope [name][N ] Presyncope [ N ]  General Review of Systems: [Y] = yes [N ]=no  Constitional: recent weight change [name][N ]; anorexia [name][N ]; fatigue [Y ]; nausea [ N]; night sweats [ N]; fever [Y ]; or chills [name][N ];  Eye : blurred vision [ N]; diplopia [name][N ]; vision changes [name][N ]; Amaurosis fugax[ N];  Resp: cough [name][N ]; wheezing[N ]; hemoptysis[N ]; shortness of breath[ N]; paroxysmal nocturnal dyspnea[ ]  GI:  vomiting[N ]; dysphagia[N ]; melena[ N]; hematochezia [ N];  GU: hematuria[ N]; dysuria [name][N ]; nocturia[N ];  Skin: rash, swelling[Y ];, hair loss[ N]; peripheral edema[Y ]; or itching[ N];  Musculosketetal: myalgias[ N]; joint swellingN[ ] ; joint erythemaN[ ] ;  joint pain[N ]; back pain[ N];  Heme/Lymph: bruising[N ]; bleeding[N ]; anemia[Y ];  Neuro: TIA[ N]; headaches[ N]; stroke[N ]; vertigo[N ]; seizures[ N]; paresthesias[ ] ; difficulty walking[N ];  Psych:depression[N ]; anxiety[N ];  Endocrine: diabetes[Y ]; thyroid dysfunction[N ];    Vital Signs: Physical Exam: General appearance: alert, cooperative and no distress Neurologic: intact Heart: regular rate and rhythm and S1, S2 normal Lungs: clear to auscultation bilaterally Abdomen: soft, non-tender; bowel sounds normal; no masses, no organomegaly Extremities: + LE edema R>L Wound: right leg evh site has been I+D'd , some suerficial erethema and associated edema.  Heent: pharynx clear of exudates or erethema Neck: no adenopathy  Diagnostic Studies and Laboratory Results:  Lab Results Last 48 Hours    No results found for this or any previous visit (from the past 48 hour(s)).    Imaging Results (Last 48 hours)    No results found.     Assessment/Plan 1.Cellulitis and infected hematoma of right EVH wound. Will be placed on both IV Vancomycin and Zosyn. Wet to dry dressing changes daily to right EVH wound. 2.S/p CABG x 4 on 01/03/2014 3. DM-continue Metformin and Victoza as taken pre op. Glucose checks and SS PRN  Jake Castro 01/21/2014, 2:23 PM  Patient needs hospitalization for IV antibiotics and manage ment of complex wound after urgent CABG  patient examined and medical record reviewed,agree with above note. Jake Castro 01/21/2014

## 2014-01-22 NOTE — Progress Notes (Addendum)
      [address]       [place] [id number]             [phone]             Subjective: Patient eating breakfast. States pain medications help "so so"  Objective: Vital signs in last 24 hours: Temp:  [98 F (36.7 C)-98.6 F (37 C)] 98.6 F (37 C) (11/05 0402) Pulse Rate:  [79-89] 88 (11/05 0402) Cardiac Rhythm:  [-] Normal sinus rhythm (11/04 2015) Resp:  [20] 20 (11/04 2033) BP: (97-111)/(53-63) 111/57 mmHg (11/05 0402) SpO2:  [93 %-98 %] 96 % (11/05 0402) FiO2 (%):  [28 %] 28 % (11/04 1414) Weight:  [250 lb (113.399 kg)] 250 lb (113.399 kg) (11/04 1224)  Pre op weight 118 kg Current Weight  01/21/14 250 lb (113.399 kg)           Physical Exam:  Cardiovascular: RRR Pulmonary: Clear to auscultation bilaterally; no rales, wheezes, or rhonchi. Abdomen: Soft, non tender, bowel sounds present. Extremities: Mild bilateral lower extremity edema R>L Wounds: Kerlex and packing in place. Will return to remove dressings and examine.  Lab Results: CBC: Recent Labs  01/21/14 1704  WBC 10.2  HGB 8.9*  HCT 29.2*  PLT 698*   BMET:  Recent Labs  01/21/14 1704  NA 135*  K 5.6*  CL 94*  CO2 26  GLUCOSE 121*  BUN 29*  CREATININE 1.64*  CALCIUM 9.5    PT/INR:  Lab Results  Component Value Date   INR 1.30 01/03/2014   INR 1.08 01/02/2014   ABG:  INR: Will add last result for INR, ABG once components are confirmed Will add last 4 CBG results once components are confirmed  Assessment/Plan:  1. CV - SR in the 90's. On Amiodarone 200 bid, Lisinopril 5 daily, and Lopressor 25 bid. 2.  Creatinine 1.64 yesterday. Stop Lisinopril for now. Re check in am 3. Volume Overload - On Lasix 40 daily. Pre op weight prior to CABG was 118 kg. He is now 113 kg. BNP 805. Will discuss diuresis with surgeon. 4.  Acute blood loss anemia - H and H yesterday 8.9 and 29.2 5. DM-CBGs 123/148/158. On Metformin 1000 bid and Victoza. 6.ID-On Vanco and Zosyn  for infected hematoma right EVH site. Will return to remove dressing and re pack wound.  ZIMMERMAN,DONIELLE MPA-C 01/22/2014,7:45 AM  Continue current care patient examined and medical record reviewed,agree with above note. [name],PETER 01/22/2014

## 2014-01-22 NOTE — Progress Notes (Signed)
1032 Came to walk with pt. Just got pain med and very sleepy. Will return as time allows. Luetta Nuttingharlene Kuba Shepherd RN BSN 01/22/2014 10:33 AM

## 2014-01-22 NOTE — Progress Notes (Addendum)
Patient give 25 mcg IV Fentanyl prior to dressing change. I removed Kerlex, 4x4, and Iodoform packing from right EVH wound. There is induration, trace erythema, and NO purulence. Iodoform gauze packed up into tunnel and dry 4x4's with Kerlex was applied. I will change and pack wound again in the am. Continue with IV Zosyn and Vancomycin for now

## 2014-01-22 NOTE — Care Management Note (Signed)
    Page 1 of 1   01/26/2014     1:51:50 PM CARE MANAGEMENT NOTE 01/26/2014  Patient:  Jake Castro,Jake Castro   Account Number:  [id number]  Date Initiated:  01/22/2014  Documentation initiated by:  Jaice Digioia  Subjective/Objective Assessment:   Pt adm on 01/21/14 with cellulitis and infected hematoma of RT EVH wound s/p recent CABG.  PTA, pt resided at home with spouse.     Action/Plan:   Will follow for dc needs as pt progresses.   Anticipated DC Date:  01/26/2014   Anticipated DC Plan:  HOME W HOME HEALTH SERVICES      DC Planning Services  CM consult      [facility]   Choice offered to / List presented to:  C-1 Patient        HH arranged  HH-1 RN      [facility]   Status of service:  Completed, signed off Medicare Important Message given?  YES (If response is "NO", the following Medicare IM given date fields will be blank) Date Medicare IM given:  01/26/2014 Medicare IM given by:  Blue Ruggerio Date Additional Medicare IM given:   Additional Medicare IM given by:    Discharge Disposition:  HOME W HOME HEALTH SERVICES  Per UR Regulation:  Reviewed for med. necessity/level of care/duration of stay  If discussed at Long Length of Stay Meetings, dates discussed:    Comments:  01/26/14 Sidney AceJulie Justan Gaede, RN, BSN [phone] Pt for dc home today; [facility].  [facility] and [facility] with this agency.  Start of care 24-48h post dc date.

## 2014-01-22 NOTE — Progress Notes (Signed)
CARDIAC REHAB PHASE I   PRE:  Rate/Rhythm: 83 SR    BP: sitting 108/58    SaO2: 95 RA  MODE:  Ambulation: 460 ft   POST:  Rate/Rhythm: 97 SR    BP: sitting 100/50     SaO2: 97 RA  Tolerated well, no c/o. Moving independently. Encouraged to walk x3 times daily. [id number]   Elissa LovettReeve, Tyniya Kuyper WiltonKristan CES, ACSM 01/22/2014 3:26 PM

## 2014-01-23 LAB — BASIC METABOLIC PANEL
ANION GAP: 16 — AB (ref 5–15)
BUN: 16 mg/dL (ref 6–23)
CO2: 29 meq/L (ref 19–32)
Calcium: 9.8 mg/dL (ref 8.4–10.5)
Chloride: 97 mEq/L (ref 96–112)
Creatinine, Ser: 1.14 mg/dL (ref 0.50–1.35)
GFR calc Af Amer: 76 mL/min — ABNORMAL LOW (ref >90)
GFR calc non Af Amer: 66 mL/min — ABNORMAL LOW (ref >90)
GLUCOSE: 117 mg/dL — AB (ref 70–99)
Potassium: 5 mEq/L (ref 3.7–5.3)
Sodium: 142 mEq/L (ref 137–147)

## 2014-01-23 LAB — GLUCOSE, CAPILLARY
Glucose-Capillary: 212 mg/dL — ABNORMAL HIGH (ref 70–99)
Glucose-Capillary: 263 mg/dL — ABNORMAL HIGH (ref 70–99)
Glucose-Capillary: 127 mg/dL — ABNORMAL HIGH (ref 70–99)
Glucose-Capillary: 221 mg/dL — ABNORMAL HIGH (ref 70–99)
Glucose-Capillary: 187 mg/dL — ABNORMAL HIGH (ref 70–99)

## 2014-01-23 MED ORDER — FENTANYL CITRATE 0.05 MG/ML IJ SOLN
25.0000 ug | Freq: Once | INTRAMUSCULAR | Status: AC
  Administered 2014-01-23: 25 ug via INTRAVENOUS
  Filled 2014-01-23: qty 2

## 2014-01-23 NOTE — Progress Notes (Addendum)
      [address]       [place] [id number]             [phone]             Subjective: Patient reports dressing "fell down" and part of packing came out. Iodoform cut off and re dressed. Nurse reports he slept most of yesterday.  Objective: Vital signs in last 24 hours: Temp:  [97.4 F (36.3 C)-98.3 F (36.8 C)] 98.3 F (36.8 C) (11/06 0536) Pulse Rate:  [82-87] 82 (11/06 0536) Cardiac Rhythm:  [-]  Resp:  [18] 18 (11/06 0536) BP: (95-124)/(58-60) 95/59 mmHg (11/06 0536) SpO2:  [95 %-99 %] 95 % (11/06 0536) Weight:  [245 lb 11.2 oz (111.449 kg)] 245 lb 11.2 oz (111.449 kg) (11/06 0536)  Pre op weight 118 kg Current Weight  01/23/14 245 lb 11.2 oz (111.449 kg)      11/05 0701 - 11/06 0700 In: 240 [P.O.:240] Out: 925 [Urine:925]   Physical Exam:  Cardiovascular: RRR Pulmonary: Clear to auscultation bilaterally; no rales, wheezes, or rhonchi. Abdomen: Soft, non tender, bowel sounds present. Extremities: Swelling in right LE decreased Wounds: Kerlex and packing in place   Lab Results: CBC:  Recent Labs  01/21/14 1704  WBC 10.2  HGB 8.9*  HCT 29.2*  PLT 698*   BMET:   Recent Labs  01/21/14 1704 01/23/14 0452  NA 135* 142  K 5.6* 5.0  CL 94* 97  CO2 26 29  GLUCOSE 121* 117*  BUN 29* 16  CREATININE 1.64* 1.14  CALCIUM 9.5 9.8    PT/INR:  Lab Results  Component Value Date   INR 1.30 01/03/2014   INR 1.08 01/02/2014   ABG:  INR: Will add last result for INR, ABG once components are confirmed Will add last 4 CBG results once components are confirmed  Assessment/Plan:  1. CV - SR in the 90's. On Amiodarone 200 bid and Lopressor 25 bid. Will decrease Amiodarone to 200 daily. 2.  Creatinine decreased from 1.64 to 1.14 Lisinopril stopped yesterday.  3. Volume Overload - On Lasix 40 daily. Pre op weight prior to CABG was 118 kg. He is now 113 kg. BNP 805. Likely will not need further diuresis at discharge. 4.  Acute  blood loss anemia - H and H yesterday 8.9 and 29.2 5. DM-CBGs 141/263/212. On Metformin 1000 bid and Victoza. 6.ID-On Vanco and Zosyn for infected hematoma right EVH site. Will have nurse do dressing change today. 7. Potassium down from 5.6 to 5. Potassium supplement stopped yesterday 8. Possible discharge Monday  ZIMMERMAN,DONIELLE MPA-C 01/23/2014,7:50 AM  patient examined and medical record reviewed,agree with above note. [name],PETER 01/23/2014

## 2014-01-23 NOTE — Discharge Summary (Signed)
Physician Discharge Summary  Patient ID: Jake Castro MRN: [id number] DOB/AGE: 1951-02-03 63 y.o.  Admit date: 01/21/2014 Discharge date: 01/26/2014  Admission Diagnoses:cellulitis and infected hematoma of EVH incision   History of Presenting Illness:  This is a [age] old Caucasian male with cardiac risk factors that include Mi 97-8 years ago), DM, hypertension, hyperlipidemia, and remote tobacco abuse who is s/p CABGx4 on 01/03/2014 by Dr. Donata ClayVan Trigt. He was seen in the office by Dr. Donata ClayVan Trigt on 01/16/2014 for redness at [facility]. He opened the right leg wound and a liquefied hematoma was removed. The wound was cultured. He was started on Keflex 500 mg by mouth three times daily and home health was arranged to pack the wound daily with iodoform gauze. He returned to see the PA on 01/19/2014. He reported he still has swelling and tenderness of the right lower leg, but no fever or purulent drainage. It was discussed that he may need to be admitted for IV antibiotics and further wound care;however, he did not want to be admitted at that time. He presented to the office today for another wound check with Dr. Donata ClayVan Trigt. He has had a low grade fever, induration, warmth and erythema of the right EVH wound. He has not responded well to oral antibiotic therapy and local wound care. He is being admitted for more agressive wound care and IV antibiotics.  Past Medical History: Past Medical History  Diagnosis Date  . CAD (coronary artery disease)     a. 12/2006 NSTEMI/PCI: RCA 4843m/d->mid vessel stented, unable to cross dist dzs w/ balloon; b. 11/2013 NL MV; c. 11/2013 Echo: EF 55%; d. 12/2013 Cath: LM 95 ost, LAD 70p, 2252m, LCX 30p/m, OM1 60, RCA 20p, 6839m/76m, 95d.  Marland Kitchen. HTN (hypertension)   . Hyperlipidemia   . Diabetes mellitus   . Carotid arterial disease     a. s/p LCEA  . Morbid obesity   . Myocardial infarction   . Pneumonia      Past Surgical History: Past Surgical History  Procedure Laterality Date  . Coronary angioplasty    . Coronary artery bypass graft N/A 01/03/2014    Procedure: CORONARY ARTERY BYPASS GRAFTING (CABG) x four, using left internal mammary and right leg greater saphenous vein harvested endoscopically; Surgeon: Kerin PernaPeter Van Trigt, MD; [facility]; Service: Open Heart Surgery; Laterality: N/A;  . Tee without cardioversion N/A 01/03/2014    Procedure: TRANSESOPHAGEAL ECHOCARDIOGRAM (TEE); Surgeon: Kerin PernaPeter Van Trigt, MD; [facility]; Service: Open Heart Surgery; Laterality: N/A;    Family History: Family History  Problem Relation Age of Onset  . Peripheral vascular disease Father     died in his 3190's - had CEA.  . Peripheral vascular disease Brother     s/p CEA  . Stroke Brother     in his 3250's  . COPD Mother     died in her 7390's.     Social History:  reports that he has quit smoking. He does not have any smokeless tobacco history on file. He reports that he does not drink alcohol or use illicit drugs.  Allergies:  Allergies  Allergen Reactions  . Kiwi Extract Nausea And Vomiting and Other (See Comments)    Throat bumps on throat     No prescriptions prior to admission   Discharge Diagnoses:  Active Problems:   Cellulitis, wound, post-operative  Patient Active Problem List   Diagnosis Date Noted  . Cellulitis, wound, post-operative 01/21/2014  . Diabetes mellitus 01/16/2014  .  S/P CABG x 4 01/03/2014  . Unstable angina 01/02/2014   Discharged Condition: good  Hospital Course: the patient was admitted and placed on both intravenous vancomycin and Zosyn. Additionally, he has been getting daily wound care with dressing changes. The incision is showing gradual improvement and erythema has minimized. He is otherwise clinically stable  and tentatively felt to be ready for discharge in the next 24-48 hours pending ongoing reevaluation of his recovery. He will require by mouth antibiotics (Augmentin and Vibramycin) at time of discharge and continued wound care.  Consults: None  Significant Diagnostic Studies: none  Treatments: wound care with iodoform packing  Discharge Exam: Blood pressure 125/67, pulse 79, temperature 97.9 F (36.6 C), temperature source Oral, resp. rate 16, height 5\' 10"  (1.778 m), weight 248 lb 3.8 oz (112.6 kg), SpO2 99 %.  Physical Exam: Cardiovascular: RRR Pulmonary: Clear to auscultation bilaterally; no rales, wheezes, or rhonchi. Abdomen: Soft, non tender, bowel sounds present. Extremities: Swelling in right LE decreased Wounds: Wound has tinge of erythema around skin edge and has minor bloody like ooze.  Disposition: 01-Home or Self Care  Medications at time of discharge:   Medication List    STOP taking these medications        lisinopril 5 MG tablet  Commonly known as:  PRINIVIL,ZESTRIL      TAKE these medications        ALPRAZolam 0.5 MG tablet  Commonly known as:  XANAX  Take 0.5-1 tablets (0.25-0.5 mg total) by mouth at bedtime as needed for sleep.     amiodarone 200 MG tablet  Commonly known as:  PACERONE  Take 1 tablet (200 mg total) by mouth daily.     amoxicillin-clavulanate 875-125 MG per tablet  Commonly known as:  AUGMENTIN  Take 1 tablet by mouth 2 (two) times daily. For 10 days then stop.     aspirin EC 325 MG tablet  Take 1 tablet (325 mg total) by mouth every evening.     atorvastatin 20 MG tablet  Commonly known as:  LIPITOR  Take 1 tablet (20 mg total) by mouth daily at 6 PM.     doxycycline 100 MG capsule  Commonly known as:  VIBRAMYCIN  Take 1 capsule (100 mg total) by mouth 2 (two) times daily. For 10 days then stop.     fentaNYL 25 MCG/HR patch  Commonly known as:  DURAGESIC - dosed mcg/hr  Place 1 patch (25 mcg total) onto the skin every 3  (three) days.     ferrous fumarate-b12-vitamic C-folic acid capsule  Commonly known as:  TRINSICON / FOLTRIN  Take 1 capsule by mouth daily. For one month then stop.     furosemide 20 MG tablet  Commonly known as:  LASIX  Take 1 tablet (20 mg total) by mouth daily. For 3 days then stop.     HYDROmorphone 2 MG tablet  Commonly known as:  DILAUDID  Take 1 tablet (2 mg total) by mouth every 4 (four) hours as needed for severe pain.     KRILL OIL PO  Take 1 tablet by mouth daily.     metFORMIN 1000 MG tablet  Commonly known as:  GLUCOPHAGE  Take 1,000 mg by mouth 2 (two) times daily with a meal.     metoprolol tartrate 25 MG tablet  Commonly known as:  LOPRESSOR  Take 1 tablet (25 mg total) by mouth 2 (two) times daily.     VICTOZA 18 MG/3ML Sopn  Generic drug:  Liraglutide  Inject 1.8 mg into the skin daily.     vitamin C 1000 MG tablet  Take 1,000 mg by mouth daily.     Vitamin D-3 5000 UNITS Tabs  Take 5,000 mg by mouth daily.     VITAMIN K PO  Take 1 tablet by mouth daily.         Follow-up Information    Follow up with VAN Dinah Beers, MD.   Specialty:  Cardiothoracic Surgery   Why:  01/28/2014 at 9:30 AM to see surgeon for wound recheck   Contact information:   [address] [employer] [address] [place] [address] [phone]       Signed: Ardelle Balls PA-C 01/26/2014, 8:16 AM

## 2014-01-23 NOTE — Progress Notes (Signed)
1450 Pt has walked twice today already and now sleepy from pain med. Since pt can walk independently, we will sign off. Luetta Nuttingharlene Melquisedec Journey RN BSN 01/23/2014 2:52 PM

## 2014-01-23 NOTE — Discharge Instructions (Signed)

## 2014-01-23 NOTE — Progress Notes (Signed)
Changed right leg dressing.  Packed with iodoform and applied wet gauze and wrapped with kerlex.  Pt pre-medicated and tolerated procedure. Pt resting with call bell within reach.  Will continue to monitor. Thomas HoffBurton, Nino Amano McClintock, RN

## 2014-01-24 LAB — GLUCOSE, CAPILLARY
Glucose-Capillary: 161 mg/dL — ABNORMAL HIGH (ref 70–99)
Glucose-Capillary: 169 mg/dL — ABNORMAL HIGH (ref 70–99)
Glucose-Capillary: 243 mg/dL — ABNORMAL HIGH (ref 70–99)
Glucose-Capillary: 280 mg/dL — ABNORMAL HIGH (ref 70–99)

## 2014-01-24 MED ORDER — FENTANYL CITRATE 0.05 MG/ML IJ SOLN
25.0000 ug | Freq: Once | INTRAMUSCULAR | Status: AC
Start: 2014-01-24 — End: 2014-01-24
  Administered 2014-01-24: 25 ug via INTRAVENOUS
  Filled 2014-01-24: qty 2

## 2014-01-24 NOTE — Plan of Care (Signed)
Problem: Phase III Progression Outcomes Goal: Temperature < 100 Outcome: Completed/Met Date Met:  01/24/14     

## 2014-01-24 NOTE — Plan of Care (Signed)
Problem: Discharge Progression Outcomes Goal: Tolerating diet Outcome: Progressing     

## 2014-01-24 NOTE — Progress Notes (Addendum)
       [address]       [place] [id number]             [phone]               Subjective: Feels better today, less swelling and soreness.   Objective: Vital signs in last 24 hours: Patient Vitals for the past 24 hrs:  BP Temp Temp src Pulse Resp SpO2 Weight  01/24/14 0440 117/71 mmHg 98.3 F (36.8 C) Oral 74 18 100 % 244 lb 3.2 oz (110.768 kg)  01/23/14 2138 125/63 mmHg - - 84 - 97 % -  01/23/14 2003 121/63 mmHg 98.8 F (37.1 C) Oral 85 18 95 % -  01/23/14 1435 (!) 108/59 mmHg 98.6 F (37 C) Oral 82 18 95 % -  01/23/14 1133 116/64 mmHg - - 81 - - -   Current Weight  01/24/14 244 lb 3.2 oz (110.768 kg)     Intake/Output from previous day: 11/06 0701 - 11/07 0700 In: 963 [P.O.:960; I.V.:3] Out: 1200 [Urine:1200]    PHYSICAL EXAM:  Heart: RRR Lungs: clear Wound: Packing with minimal drainage, good granulation tissue Extremities: Mild edema, minimal induration of RLE    Lab Results: CBC: Recent Labs  01/21/14 1704  WBC 10.2  HGB 8.9*  HCT 29.2*  PLT 698*   BMET:  Recent Labs  01/21/14 1704 01/23/14 0452  NA 135* 142  K 5.6* 5.0  CL 94* 97  CO2 26 29  GLUCOSE 121* 117*  BUN 29* 16  CREATININE 1.64* 1.14  CALCIUM 9.5 9.8    PT/INR: No results for input(s): LABPROT, INR in the last 72 hours.    Assessment/Plan: Wound healing well, leg less erythematous and edematous. Continue antibiotics, local wound care. Cr back down to baseline today.   LOS: 3 days    COLLINS,GINA H 01/24/2014  Wound dressing changed today Home Monday? I have seen and examined Kathie Rhodesobert [facility] and agree with the above assessment  and plan.  Delight OvensEdward B Charlea Nardo MD Beeper [phone] Office [phone] 01/24/2014 11:19 AM

## 2014-01-25 LAB — GLUCOSE, CAPILLARY
Glucose-Capillary: 226 mg/dL — ABNORMAL HIGH (ref 70–99)
Glucose-Capillary: 198 mg/dL — ABNORMAL HIGH (ref 70–99)
Glucose-Capillary: 297 mg/dL — ABNORMAL HIGH (ref 70–99)
Glucose-Capillary: 345 mg/dL — ABNORMAL HIGH (ref 70–99)
Glucose-Capillary: 333 mg/dL — ABNORMAL HIGH (ref 70–99)

## 2014-01-25 MED ORDER — METFORMIN HCL 500 MG PO TABS
1000.0000 mg | ORAL_TABLET | Freq: Two times a day (BID) | ORAL | Status: DC
  Administered 2014-01-25 – 2014-01-26 (×2): 1000 mg via ORAL
  Filled 2014-01-25 (×5): qty 2

## 2014-01-25 MED ORDER — INSULIN ASPART 100 UNIT/ML ~~LOC~~ SOLN
0.0000 [IU] | Freq: Three times a day (TID) | SUBCUTANEOUS | Status: DC
  Administered 2014-01-25: 11 [IU] via SUBCUTANEOUS
  Administered 2014-01-26: 2 [IU] via SUBCUTANEOUS

## 2014-01-25 MED ORDER — FENTANYL CITRATE 0.05 MG/ML IJ SOLN
25.0000 ug | Freq: Once | INTRAMUSCULAR | Status: AC
  Administered 2014-01-25: 25 ug via INTRAVENOUS
  Filled 2014-01-25: qty 2

## 2014-01-25 NOTE — Progress Notes (Addendum)
       [address]       [place] [id number]             [phone]               Subjective: Stable, no new complaints.   Objective: Vital signs in last 24 hours: Patient Vitals for the past 24 hrs:  BP Temp Temp src Pulse Resp SpO2 Weight  01/25/14 0500 114/62 mmHg 98.4 F (36.9 C) Oral 76 - 98 % 247 lb 3.2 oz (112.129 kg)  01/24/14 1941 123/62 mmHg 99 F (37.2 C) Oral 84 18 99 % -  01/24/14 1328 112/61 mmHg 98.8 F (37.1 C) Oral 73 18 100 % -   Current Weight  01/25/14 247 lb 3.2 oz (112.129 kg)     Intake/Output from previous day: 11/07 0701 - 11/08 0700 In: 3080 [P.O.:1200; I.V.:30; IV Piggyback:1850] Out: 2930 [Urine:2930]  CBGs 280-226  PHYSICAL EXAM:  Heart: RRR Lungs: Clear Wound: Good granulation tissue, no purulence Extremities: Decreased edema today, erythema/ induration nearly resolved    Lab Results: CBC:No results for input(s): WBC, HGB, HCT, PLT in the last 72 hours. BMET:  Recent Labs  01/23/14 0452  NA 142  K 5.0  CL 97  CO2 29  GLUCOSE 117*  BUN 16  CREATININE 1.14  CALCIUM 9.8    PT/INR: No results for input(s): LABPROT, INR in the last 72 hours.    Assessment/Plan: RLE wound granulating well, erythema/induration resolving.  Continue abx, local wound care. DM- sugars elevated, home meds apparently did not get restarted.  Will resume Metformin since Cr back to baseline. Possibly home in am- he still requires IV pain meds with dressing changes, so this will need to be addressed.   LOS: 4 days    COLLINS,GINA H 01/25/2014    Metformin restarted today Home tomorrow with local wound care  I have seen and examined Jake Castro and agree with the above assessment  and plan.  Delight OvensEdward B Danyla Wattley MD [facility] [phone] Office [phone] 01/25/2014 10:02 AM

## 2014-01-25 NOTE — Plan of Care (Signed)
Problem: Phase III Progression Outcomes Goal: Discharge plan remains appropriate-arrangements made Outcome: Progressing     

## 2014-01-26 LAB — GLUCOSE, CAPILLARY
Glucose-Capillary: 141 mg/dL — ABNORMAL HIGH (ref 70–99)
Glucose-Capillary: 183 mg/dL — ABNORMAL HIGH (ref 70–99)

## 2014-01-26 MED ORDER — ALPRAZOLAM 0.5 MG PO TABS: 0.2500 mg | ORAL_TABLET | Freq: Every evening | ORAL | Status: DC | PRN

## 2014-01-26 MED ORDER — DOXYCYCLINE HYCLATE 100 MG PO CAPS: 100.0000 mg | ORAL_CAPSULE | Freq: Two times a day (BID) | ORAL | Status: DC

## 2014-01-26 MED ORDER — HYDROMORPHONE HCL 2 MG PO TABS: 2.0000 mg | ORAL_TABLET | ORAL | Status: DC | PRN

## 2014-01-26 MED ORDER — AMIODARONE HCL 200 MG PO TABS: 200.0000 mg | ORAL_TABLET | Freq: Every day | ORAL | Status: DC

## 2014-01-26 MED ORDER — FE FUMARATE-B12-VIT C-FA-IFC PO CAPS: 1.0000 | ORAL_CAPSULE | Freq: Every day | ORAL | Status: DC

## 2014-01-26 MED ORDER — METOPROLOL TARTRATE 25 MG PO TABS: 25.0000 mg | ORAL_TABLET | Freq: Two times a day (BID) | ORAL | Status: DC

## 2014-01-26 MED ORDER — FENTANYL CITRATE 0.05 MG/ML IJ SOLN
25.0000 ug | Freq: Once | INTRAMUSCULAR | Status: AC
  Administered 2014-01-26: 25 ug via INTRAVENOUS
  Filled 2014-01-26: qty 2

## 2014-01-26 MED ORDER — FUROSEMIDE 20 MG PO TABS: 20.0000 mg | ORAL_TABLET | Freq: Every day | ORAL | Status: DC

## 2014-01-26 MED ORDER — AMOXICILLIN-POT CLAVULANATE 875-125 MG PO TABS: 1.0000 | ORAL_TABLET | Freq: Two times a day (BID) | ORAL | Status: DC

## 2014-01-26 MED ORDER — FENTANYL 25 MCG/HR TD PT72: 25.0000 ug | MEDICATED_PATCH | TRANSDERMAL | Status: DC

## 2014-01-26 NOTE — Progress Notes (Addendum)
      [address]       [place] [id number]             [phone]             Subjective: Patient without complaints this am  Objective: Vital signs in last 24 hours: Temp:  [97.9 F (36.6 C)-98.7 F (37.1 C)] 97.9 F (36.6 C) (11/09 0511) Pulse Rate:  [78-81] 79 (11/09 0511) Cardiac Rhythm:  [-] Normal sinus rhythm (11/09 0140) Resp:  [16-18] 16 (11/09 0511) BP: (101-138)/(59-67) 125/67 mmHg (11/09 0511) SpO2:  [97 %-100 %] 99 % (11/09 0511) Weight:  [248 lb 3.8 oz (112.6 kg)] 248 lb 3.8 oz (112.6 kg) (11/09 0500)  Pre op weight 118 kg Current Weight  01/26/14 248 lb 3.8 oz (112.6 kg)      11/08 0701 - 11/09 0700 In: 920 [P.O.:720; IV Piggyback:200] Out: 1425 [Urine:1425]   Physical Exam:  Cardiovascular: RRR Pulmonary: Clear to auscultation bilaterally; no rales, wheezes, or rhonchi. Abdomen: Soft, non tender, bowel sounds present. Extremities: Swelling in right LE decreased Wounds:  Wound has tinge of erythema around skin edge and has minor bloody like ooze.   Lab Results: CBC: No results for input(s): WBC, HGB, HCT, PLT in the last 72 hours. BMET:  No results for input(s): NA, K, CL, CO2, GLUCOSE, BUN, CREATININE, CALCIUM in the last 72 hours.  PT/INR:  Lab Results  Component Value Date   INR 1.30 01/03/2014   INR 1.08 01/02/2014   ABG:  INR: Will add last result for INR, ABG once components are confirmed Will add last 4 CBG results once components are confirmed  Assessment/Plan:  1. CV - SR in the 90's. On Amiodarone 200 bid and Lopressor 25 bid. Will decrease Amiodarone to 200 daily at discharge. 2. Volume Overload - On Lasix 40 daily. Pre op weight prior to CABG was 118 kg. He is now 113 kg. BNP 805.Per Dr. Donata ClayVan Trigt, continue Lasix 20 daily for 3 days then stop. 3.  Acute blood loss anemia - Last H and H  8.9 and 29.2 4. DM-CBGs 345/333/141. On Metformin 1000 bid and Victoza. 5.ID-On Vanco and Zosyn for infected hematoma  right EVH site. Per Dr. Donata ClayVan Trigt, Augmentin and Vibramycin at discharge 8. Discharge today  ZIMMERMAN,DONIELLE MPA-C 01/26/2014,7:29 AM

## 2014-01-26 NOTE — Progress Notes (Signed)
Pt dsg changed completed by Gala Romneyhesney student nurse and her instructor. Dressing clean, dry and intact. Arabella MerlesP. Amo [place] Behlke RN.

## 2014-01-26 NOTE — Progress Notes (Signed)
Pt A&O x4; pt discharge education and instructions completed with pt at bedside. Pt voices understanding and denies any questions. Pt IV and telemetry removed; pt dsg changed as ordered; pt handed his prescription orders; case management saw pt concerning his home health RN needs; pt transported off unit via wheelchair with belongings to the side. Pt discharge home and pt family to transport pt off to disposition. Jake MerlesP. Amo Jaquia Benedicto RN.

## 2014-01-28 ENCOUNTER — Encounter: Payer: Self-pay | Admitting: Cardiothoracic Surgery

## 2014-01-28 ENCOUNTER — Ambulatory Visit (INDEPENDENT_AMBULATORY_CARE_PROVIDER_SITE_OTHER): Payer: Self-pay | Admitting: Cardiothoracic Surgery

## 2014-01-28 VITALS — BP 109/69 | HR 75 | Resp 20 | Ht 70.0 in | Wt 248.0 lb

## 2014-01-28 DIAGNOSIS — I2510 Atherosclerotic heart disease of native coronary artery without angina pectoris: Secondary | ICD-10-CM

## 2014-01-28 DIAGNOSIS — L03115 Cellulitis of right lower limb: Secondary | ICD-10-CM

## 2014-01-28 NOTE — Progress Notes (Signed)
PCP is Dorothey BasemanBRONSTEIN,[name], MD Referring Provider is Lamar BlinksKowalski, Bruce J, MD  Chief Complaint  Patient presents with  . Routine Post Op    F/U from hospital admit, wound check    HPI:the patient presents for wound check of the right lower leg and the vein harvest site. He had CABG x4 last month. 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 The hematoma became infected. The hematoma was incised and drained and the Endo tunnel packed. He was hospitalized for wound care and IV antibiotics. The initial hematoma culture was sent to the lab but was not processed. Subsequent cultures were negative.  The leg is now significantly improved. The saline is is almost resolved. The edema has resolved. The patient is walking better. Home health nurse is packing the, with a half-inch iodoform gauze daily. He is finishing a course of oral Augmentin.   Past Medical History  Diagnosis Date  . CAD (coronary artery disease)     a. 12/2006 NSTEMI/PCI: RCA 5722m/d->mid vessel stented, unable to cross dist dzs w/ balloon;  b. 11/2013 NL MV;  c. 11/2013 Echo: EF 55%;  d. 12/2013 Cath: LM 95 ost, LAD 70p, 10392m, LCX 30p/m, OM1 60, RCA 20p, 4566m/23m, 95d.  Marland Kitchen. HTN (hypertension)   . Hyperlipidemia   . Diabetes mellitus   . Carotid arterial disease     a. s/p LCEA  . Morbid obesity   . Myocardial infarction   . Pneumonia     Past Surgical History  Procedure Laterality Date  . Coronary angioplasty    . Coronary artery bypass graft N/A 01/03/2014    Procedure: CORONARY ARTERY BYPASS GRAFTING (CABG) x four, using left internal mammary and right leg greater saphenous vein harvested endoscopically;  Surgeon: Kerin PernaPeter Van Trigt, MD;  [facility];  Service: Open Heart Surgery;  Laterality: N/A;  . Tee without cardioversion N/A 01/03/2014    Procedure: TRANSESOPHAGEAL ECHOCARDIOGRAM (TEE);  Surgeon: Kerin PernaPeter Van Trigt, MD;  [facility];  Service: Open Heart Surgery;  Laterality: N/A;    Family History  Problem Relation  Age of Onset  . Peripheral vascular disease Father     died in his 4290's - had CEA.  . Peripheral vascular disease Brother     s/p CEA  . Stroke Brother     in his 4050's  . COPD Mother     died in her 5890's.    Social History History  Substance Use Topics  . Smoking status: Former Games developermoker  . Smokeless tobacco: Never Used     Comment: smoked for a few months in his early 20's.  . Alcohol Use: No    Current Outpatient Prescriptions  Medication Sig Dispense Refill  . ALPRAZolam (XANAX) 0.5 MG tablet Take 0.5-1 tablets (0.25-0.5 mg total) by mouth at bedtime as needed for sleep. 10 tablet 0  . amiodarone (PACERONE) 200 MG tablet Take 1 tablet (200 mg total) by mouth daily. 30 tablet 1  . amoxicillin-clavulanate (AUGMENTIN) 875-125 MG per tablet Take 1 tablet by mouth 2 (two) times daily. For 10 days then stop. 20 tablet 0  . Ascorbic Acid (VITAMIN C) 1000 MG tablet Take 1,000 mg by mouth daily.    Marland Kitchen. aspirin EC 325 MG tablet Take 1 tablet (325 mg total) by mouth every evening.    Marland Kitchen. atorvastatin (LIPITOR) 20 MG tablet Take 1 tablet (20 mg total) by mouth daily at 6 PM. 30 tablet 1  . carvedilol (COREG) 12.5 MG tablet Take 12.5 mg by  mouth 2 (two) times daily with a meal.    . Cholecalciferol (VITAMIN D-3) 5000 UNITS TABS Take 5,000 mg by mouth daily.    Marland Kitchen. doxycycline (VIBRAMYCIN) 100 MG capsule Take 1 capsule (100 mg total) by mouth 2 (two) times daily. For 10 days then stop. 20 capsule 0  . fentaNYL (DURAGESIC - DOSED MCG/HR) 25 MCG/HR patch Place 1 patch (25 mcg total) onto the skin every 3 (three) days. 5 patch 0  . ferrous fumarate-b12-vitamic C-folic acid (TRINSICON / FOLTRIN) capsule Take 1 capsule by mouth daily. For one month then stop. 30 capsule 0  . furosemide (LASIX) 20 MG tablet Take 1 tablet (20 mg total) by mouth daily. For 3 days then stop. 30 tablet   . HYDROmorphone (DILAUDID) 2 MG tablet Take 1 tablet (2 mg total) by mouth every 4 (four) hours as needed for severe pain. 30  tablet 0  . KRILL OIL PO Take 1 tablet by mouth daily.    . Liraglutide (VICTOZA) 18 MG/3ML SOPN Inject 1.8 mg into the skin daily.    . metFORMIN (GLUCOPHAGE) 1000 MG tablet Take 1,000 mg by mouth 2 (two) times daily with a meal.    . metoprolol tartrate (LOPRESSOR) 25 MG tablet Take 1 tablet (25 mg total) by mouth 2 (two) times daily. 60 tablet 1  . VITAMIN K PO Take 1 tablet by mouth daily.     No current facility-administered medications for this visit.    Allergies  Allergen Reactions  . Kiwi Extract Nausea And Vomiting and Other (See Comments)    Throat bumps on throat     Review of Systemsno fever, no angina, chest incision well-healed, leg feeling better  BP 109/69 mmHg  Pulse 75  Resp 20  Ht 5\' 10"  (1.778 m)  Wt 248 lb (112.492 kg)  BMI 35.58 kg/m2  SpO2 96% Physical Exam Alert and comfortable accompanied by wife Sternum stable well-healed Heart rate regular Lungs clear No peripheral edema  Diagnostic Tests: No chest x-ray today  Impression: Improved  cellulitis right lower leg with infected hematoma of the Endo vein tunnel  Plan:finish course of oral antibiotic Continue home health nursing wound care-tunnel needs to be packed once daily . Return for office visit With x-ray in one week

## 2014-01-30 ENCOUNTER — Encounter (INDEPENDENT_AMBULATORY_CARE_PROVIDER_SITE_OTHER): Payer: Self-pay

## 2014-01-30 DIAGNOSIS — L089 Local infection of the skin and subcutaneous tissue, unspecified: Secondary | ICD-10-CM

## 2014-02-02 ENCOUNTER — Ambulatory Visit: Payer: Medicare PPO

## 2014-02-03 ENCOUNTER — Other Ambulatory Visit: Payer: Self-pay | Admitting: Cardiothoracic Surgery

## 2014-02-03 DIAGNOSIS — Z951 Presence of aortocoronary bypass graft: Secondary | ICD-10-CM

## 2014-02-04 ENCOUNTER — Ambulatory Visit
Admission: RE | Admit: 2014-02-04 | Discharge: 2014-02-04 | Disposition: A | Discharge status: Home / Self Care | Payer: Medicare PPO | Source: Ambulatory Visit | Attending: Cardiothoracic Surgery | Admitting: Cardiothoracic Surgery

## 2014-02-04 ENCOUNTER — Encounter: Payer: Self-pay | Admitting: Cardiothoracic Surgery

## 2014-02-04 ENCOUNTER — Ambulatory Visit (INDEPENDENT_AMBULATORY_CARE_PROVIDER_SITE_OTHER): Payer: Self-pay | Admitting: Cardiothoracic Surgery

## 2014-02-04 VITALS — BP 116/64 | HR 70 | Resp 16 | Ht 70.0 in | Wt 248.0 lb

## 2014-02-04 DIAGNOSIS — Z951 Presence of aortocoronary bypass graft: Secondary | ICD-10-CM

## 2014-02-04 DIAGNOSIS — L03119 Cellulitis of unspecified part of limb: Secondary | ICD-10-CM

## 2014-02-04 DIAGNOSIS — L02419 Cutaneous abscess of limb, unspecified: Secondary | ICD-10-CM

## 2014-02-04 DIAGNOSIS — I2510 Atherosclerotic heart disease of native coronary artery without angina pectoris: Secondary | ICD-10-CM

## 2014-02-04 NOTE — Progress Notes (Signed)
PCP is Dorothey BasemanBRONSTEIN,[name], MD Referring Provider is Lamar BlinksKowalski, Bruce J, MD  Chief Complaint  Patient presents with  . Routine Post Op    1 wk f/u right lower leg vein harvest site    HPI:one week wound check of right lower extremity Endo vein tunnel which developed a infected hematoma. Patient finishing course of oral Augmentin and Doxycycline Wound packed with half-inch iodoform daily by patient. Home health nurse will sign off tomorrow. Wound is granulating in well. Patient overall is improved. Patient still needs to walk more-goal of 20 minute walk daily. Still having chest wall soreness require narcotics. No angina or symptoms of CHF. Chest x-ray today is clear. Sternal wires intact..   Past Medical History  Diagnosis Date  . CAD (coronary artery disease)     a. 12/2006 NSTEMI/PCI: RCA 867m/d->mid vessel stented, unable to cross dist dzs w/ balloon;  b. 11/2013 NL MV;  c. 11/2013 Echo: EF 55%;  d. 12/2013 Cath: LM 95 ost, LAD 70p, 3559m, LCX 30p/m, OM1 60, RCA 20p, 6067m/38m, 95d.  Marland Kitchen. HTN (hypertension)   . Hyperlipidemia   . Diabetes mellitus   . Carotid arterial disease     a. s/p LCEA  . Morbid obesity   . Myocardial infarction   . Pneumonia     Past Surgical History  Procedure Laterality Date  . Coronary angioplasty    . Coronary artery bypass graft N/A 01/03/2014    Procedure: CORONARY ARTERY BYPASS GRAFTING (CABG) x four, using left internal mammary and right leg greater saphenous vein harvested endoscopically;  Surgeon: Kerin PernaPeter Van Trigt, MD;  [facility];  Service: Open Heart Surgery;  Laterality: N/A;  . Tee without cardioversion N/A 01/03/2014    Procedure: TRANSESOPHAGEAL ECHOCARDIOGRAM (TEE);  Surgeon: Kerin PernaPeter Van Trigt, MD;  [facility];  Service: Open Heart Surgery;  Laterality: N/A;    Family History  Problem Relation Age of Onset  . Peripheral vascular disease Father     died in his 5190's - had CEA.  . Peripheral vascular disease Brother     s/p CEA  . Stroke  Brother     in his 950's  . COPD Mother     died in her 4790's.    Social History History  Substance Use Topics  . Smoking status: Former Games developermoker  . Smokeless tobacco: Never Used     Comment: smoked for a few months in his early 20's.  . Alcohol Use: No    Current Outpatient Prescriptions  Medication Sig Dispense Refill  . ALPRAZolam (XANAX) 0.5 MG tablet Take 0.5-1 tablets (0.25-0.5 mg total) by mouth at bedtime as needed for sleep. 10 tablet 0  . amiodarone (PACERONE) 200 MG tablet Take 1 tablet (200 mg total) by mouth daily. 30 tablet 1  . amoxicillin-clavulanate (AUGMENTIN) 875-125 MG per tablet Take 1 tablet by mouth 2 (two) times daily. For 10 days then stop. 20 tablet 0  . Ascorbic Acid (VITAMIN C) 1000 MG tablet Take 1,000 mg by mouth daily.    Marland Kitchen. aspirin EC 325 MG tablet Take 1 tablet (325 mg total) by mouth every evening.    Marland Kitchen. atorvastatin (LIPITOR) 20 MG tablet Take 1 tablet (20 mg total) by mouth daily at 6 PM. 30 tablet 1  . carvedilol (COREG) 12.5 MG tablet Take 12.5 mg by mouth 2 (two) times daily with a meal.    . Cholecalciferol (VITAMIN D-3) 5000 UNITS TABS Take 5,000 mg by mouth daily.    Marland Kitchen. doxycycline (VIBRAMYCIN) 100  MG capsule Take 1 capsule (100 mg total) by mouth 2 (two) times daily. For 10 days then stop. 20 capsule 0  . fentaNYL (DURAGESIC - DOSED MCG/HR) 25 MCG/HR patch Place 1 patch (25 mcg total) onto the skin every 3 (three) days. 5 patch 0  . ferrous fumarate-b12-vitamic C-folic acid (TRINSICON / FOLTRIN) capsule Take 1 capsule by mouth daily. For one month then stop. 30 capsule 0  . furosemide (LASIX) 20 MG tablet Take 1 tablet (20 mg total) by mouth daily. For 3 days then stop. 30 tablet   . HYDROmorphone (DILAUDID) 2 MG tablet Take 1 tablet (2 mg total) by mouth every 4 (four) hours as needed for severe pain. 30 tablet 0  . KRILL OIL PO Take 1 tablet by mouth daily.    . Liraglutide (VICTOZA) 18 MG/3ML SOPN Inject 1.8 mg into the skin daily.    .  metoprolol tartrate (LOPRESSOR) 25 MG tablet Take 1 tablet (25 mg total) by mouth 2 (two) times daily. 60 tablet 1  . VITAMIN K PO Take 1 tablet by mouth daily.    . metFORMIN (GLUCOPHAGE) 1000 MG tablet Take 1,000 mg by mouth 2 (two) times daily with a meal.     No current facility-administered medications for this visit.    Allergies  Allergen Reactions  . Kiwi Extract Nausea And Vomiting and Other (See Comments)    Throat bumps on throat     Review of Systemsno weight loss or fever sleeping well but would prefer to lay on his side or prone  BP 116/64 mmHg  Pulse 70  Resp 16  Ht 5\' 10"  (1.778 m)  Wt 248 lb (112.492 kg)  BMI 35.58 kg/m2  SpO2 97% Physical Exam Alert and comfortable Lungs clear Heart rhythm regular Sternum well-healed Wound and right lower leg clean with minimal drainage-this was personally clean with peroxide, rinse with saline, silver nitrate was applied and a new half-inch Nu Gauze strip inserted.  Diagnostic Tests: PA and lateral chest x-rays clear Impression: Improved leg wound-continue current wound care Finish current antibiotics then when finished switch over to Keflex 500 by mouth 3 times a day.  Plan:return for wound check in office in one week without chest x-ray

## 2014-02-10 ENCOUNTER — Other Ambulatory Visit: Payer: Self-pay | Admitting: Thoracic Surgery (Cardiothoracic Vascular Surgery)

## 2014-02-10 ENCOUNTER — Other Ambulatory Visit: Payer: Self-pay | Admitting: Cardiothoracic Surgery

## 2014-02-10 DIAGNOSIS — Z951 Presence of aortocoronary bypass graft: Secondary | ICD-10-CM

## 2014-02-11 ENCOUNTER — Ambulatory Visit: Payer: Medicare PPO | Admitting: Cardiothoracic Surgery

## 2014-02-16 ENCOUNTER — Ambulatory Visit (INDEPENDENT_AMBULATORY_CARE_PROVIDER_SITE_OTHER): Payer: Self-pay | Admitting: Surgical

## 2014-02-16 VITALS — BP 139/85 | HR 88 | Resp 16 | Ht 70.0 in | Wt 246.0 lb

## 2014-02-16 DIAGNOSIS — L02419 Cutaneous abscess of limb, unspecified: Secondary | ICD-10-CM

## 2014-02-16 DIAGNOSIS — I2510 Atherosclerotic heart disease of native coronary artery without angina pectoris: Secondary | ICD-10-CM

## 2014-02-16 DIAGNOSIS — L03119 Cellulitis of unspecified part of limb: Secondary | ICD-10-CM

## 2014-02-16 DIAGNOSIS — Z951 Presence of aortocoronary bypass graft: Secondary | ICD-10-CM

## 2014-02-16 MED ORDER — HYDROCODONE-ACETAMINOPHEN 10-325 MG PO TABS: 1.0000 | ORAL_TABLET | Freq: Four times a day (QID) | ORAL | Status: DC | PRN

## 2014-02-16 NOTE — Patient Instructions (Signed)
Discussed daily continued dressing changes, and he understands.

## 2014-02-16 NOTE — Addendum Note (Signed)
Addended by: Gershon CraneGOLD, Nikeia Henkes E on: 02/16/2014 03:40 PM   Modules accepted: Orders

## 2014-02-16 NOTE — Progress Notes (Signed)
[address]       [place] [id number]             [phone]                  Kathie RhodesRobert H Arenz [facility] Medical Record #[id number] Date of Birth: 12-03-1950  Referring OZ:HYQMVHQID:[name], Quentin CornwallBruce J, MD Primary Cardiology: Primary Care:[name],[name], MD  Chief Complaint:  Follow Up Visit   History of Present Illness: The patient is seen in the office for a wound check of his right lower extremity EVH site. The small opening at the lower puncture site continues to have small amounts of slightly purulent drainage. He continues to pack the wound and express the drainage daily.         Zubrod Score: At the time of surgery this patient's most appropriate activity status/level should be described as: []     0    Normal activity, no symptoms []     1    Restricted in physical strenuous activity but ambulatory, able to do out light work []     2    Ambulatory and capable of self care, unable to do work activities, up and about                 >50 % of waking hours                                                                                   []     3    Only limited self care, in bed greater than 50% of waking hours []     4    Completely disabled, no self care, confined to bed or chair []     5    Moribund  History  Smoking status  . Former Smoker  Smokeless tobacco  . Never Used    Comment: smoked for a few months in his early 20's.       Allergies  Allergen Reactions  . Kiwi Extract Nausea And Vomiting and Other (See Comments)    Throat bumps on throat     Current Outpatient Prescriptions  Medication Sig Dispense Refill  . amiodarone (PACERONE) 200 MG tablet Take 1 tablet (200 mg total) by mouth daily. 30 tablet 1  . amoxicillin-clavulanate (AUGMENTIN) 875-125 MG per tablet Take 1 tablet by mouth 2 (two) times daily. For 10 days then stop. 20 tablet 0  . Ascorbic Acid (VITAMIN C) 1000 MG tablet Take 1,000 mg by mouth daily.    Marland Kitchen. aspirin EC 325 MG  tablet Take 1 tablet (325 mg total) by mouth every evening.    Marland Kitchen. atorvastatin (LIPITOR) 20 MG tablet Take 1 tablet (20 mg total) by mouth daily at 6 PM. 30 tablet 1  . carvedilol (COREG) 12.5 MG tablet Take 12.5 mg by mouth 2 (two) times daily with a meal.    . Cholecalciferol (VITAMIN D-3) 5000 UNITS TABS Take 5,000 mg by mouth daily.    . ferrous fumarate-b12-vitamic C-folic acid (TRINSICON / FOLTRIN) capsule Take 1 capsule by mouth daily. For one month then stop. 30 capsule 0  . KRILL OIL PO Take 1 tablet  by mouth daily.    . Liraglutide (VICTOZA) 18 MG/3ML SOPN Inject 1.8 mg into the skin daily.    . metFORMIN (GLUCOPHAGE) 1000 MG tablet Take 1,000 mg by mouth 2 (two) times daily with a meal.    . metoprolol tartrate (LOPRESSOR) 25 MG tablet Take 1 tablet (25 mg total) by mouth 2 (two) times daily. 60 tablet 1  . VITAMIN K PO Take 1 tablet by mouth daily.     No current facility-administered medications for this visit.       Physical Exam: BP 139/85 mmHg  Pulse 88  Resp 16  Ht 5\' 10"  (1.778 m)  Wt 246 lb (111.585 kg)  BMI 35.30 kg/m2  SpO2 98%  Wound: The wound tracks slightly towards the knee. There is a small amount of hemopurulent drainage. There is no cellulitis or warmth. Wounds:  Diagnostic Studies & Laboratory data:         Recent Radiology Findings: No results found.    Recent Labs: Lab Results  Component Value Date   WBC 10.2 01/21/2014   HGB 8.9* 01/21/2014   HCT 29.2* 01/21/2014   PLT 698* 01/21/2014   GLUCOSE 117* 01/23/2014   CHOL 206* 01/03/2014   TRIG 350* 01/03/2014   HDL 43 01/03/2014   LDLCALC 93 01/03/2014   NA 142 01/23/2014   K 5.0 01/23/2014   CL 97 01/23/2014   CREATININE 1.14 01/23/2014   BUN 16 01/23/2014   CO2 29 01/23/2014   INR 1.30 01/03/2014   HGBA1C 7.2* 01/02/2014      Assessment / Plan:  -EVH site continues to heal. He is to continue daily dressings as previously with small packing. The patient states he has discontinued  all his pain medications and requests hydrocodone instead. I gave him a prescription for 10 mg every 6 hours when necessary pain #30. We will see him again in 3 weeks to follow up on the wound and sooner if he has any difficulties.          Jahnay Lantier E 02/16/2014 1:53 PM

## 2014-02-20 ENCOUNTER — Ambulatory Visit (INDEPENDENT_AMBULATORY_CARE_PROVIDER_SITE_OTHER): Payer: Medicare PPO | Admitting: Surgical

## 2014-02-20 VITALS — BP 126/71 | HR 73 | Temp 97.1°F | Resp 16 | Ht 70.0 in | Wt 245.0 lb

## 2014-02-20 DIAGNOSIS — Z951 Presence of aortocoronary bypass graft: Secondary | ICD-10-CM

## 2014-02-20 DIAGNOSIS — L03115 Cellulitis of right lower limb: Secondary | ICD-10-CM | POA: Diagnosis not present

## 2014-02-20 NOTE — Patient Instructions (Signed)
Follow wound closely

## 2014-02-20 NOTE — Progress Notes (Signed)
[address]       [name] [id number]             [phone]                  Kathie RhodesRobert H Hannibal [facility] Medical Record #[id number] Date of Birth: 13-Feb-1951  Referring OZ:HYQMVHQID:[name], Quentin CornwallBruce J, MD Primary Cardiology: Primary Care:[name],[name], MD  Chief Complaint:  Follow Up Visit   History of Present Illness:    Patient was concerned about increased redness and warmth at right leg EVH site Having no fevers or other constitutional sx          Zubrod Score: At the time of surgery this patient's most appropriate activity status/level should be described as: []     0    Normal activity, no symptoms []     1    Restricted in physical strenuous activity but ambulatory, able to do out light work []     2    Ambulatory and capable of self care, unable to do work activities, up and about                 >50 % of waking hours                                                                                   []     3    Only limited self care, in bed greater than 50% of waking hours []     4    Completely disabled, no self care, confined to bed or chair []     5    Moribund  History  Smoking status  . Former Smoker  Smokeless tobacco  . Never Used    Comment: smoked for a few months in his early 20's.       Allergies  Allergen Reactions  . Kiwi Extract Nausea And Vomiting and Other (See Comments)    Throat bumps on throat     Current Outpatient Prescriptions  Medication Sig Dispense Refill  . amiodarone (PACERONE) 200 MG tablet Take 1 tablet (200 mg total) by mouth daily. 30 tablet 1  . Ascorbic Acid (VITAMIN C) 1000 MG tablet Take 1,000 mg by mouth daily.    Marland Kitchen. aspirin EC 325 MG tablet Take 1 tablet (325 mg total) by mouth every evening.    Marland Kitchen. atorvastatin (LIPITOR) 20 MG tablet Take 1 tablet (20 mg total) by mouth daily at 6 PM. 30 tablet 1  . carvedilol (COREG) 12.5 MG tablet Take 12.5 mg by mouth 2 (two) times daily with a meal.    .  Cholecalciferol (VITAMIN D-3) 5000 UNITS TABS Take 5,000 mg by mouth daily.    . ferrous fumarate-b12-vitamic C-folic acid (TRINSICON / FOLTRIN) capsule Take 1 capsule by mouth daily. For one month then stop. 30 capsule 0  . HYDROcodone-acetaminophen (NORCO) 10-325 MG per tablet Take 1 tablet by mouth every 6 (six) hours as needed. 30 tablet 0  . KRILL OIL PO Take 1 tablet by mouth daily.    . Liraglutide (VICTOZA) 18 MG/3ML SOPN Inject 1.8 mg into the skin daily.    . metFORMIN (GLUCOPHAGE)  1000 MG tablet Take 1,000 mg by mouth 2 (two) times daily with a meal.    . metoprolol tartrate (LOPRESSOR) 25 MG tablet Take 1 tablet (25 mg total) by mouth 2 (two) times daily. 60 tablet 1  . VITAMIN K PO Take 1 tablet by mouth daily.     No current facility-administered medications for this visit.       Physical Exam: BP 126/71 mmHg  Pulse 73  Temp(Src) 97.1 F (36.2 C) (Oral)  Resp 16  Ht 5\' 10"  (1.778 m)  Wt 245 lb (111.131 kg)  BMI 35.15 kg/m2  SpO2 98%  Wound: EVH site slight erethema, no lymphangitis or cellulitis, no floculance or evidence of abscess.  Wounds:  Diagnostic Studies & Laboratory data:         Recent Radiology Findings: No results found.    Recent Labs: Lab Results  Component Value Date   WBC 10.2 01/21/2014   HGB 8.9* 01/21/2014   HCT 29.2* 01/21/2014   PLT 698* 01/21/2014   GLUCOSE 117* 01/23/2014   CHOL 206* 01/03/2014   TRIG 350* 01/03/2014   HDL 43 01/03/2014   LDLCALC 93 01/03/2014   NA 142 01/23/2014   K 5.0 01/23/2014   CL 97 01/23/2014   CREATININE 1.14 01/23/2014   BUN 16 01/23/2014   CO2 29 01/23/2014   INR 1.30 01/03/2014   HGBA1C 7.2* 01/02/2014      Assessment / Plan:  Inflammatory rxn at right leg evh site. No definate infection. Cont close observation and if worsens we can place on po abx          Castro,Jake E 02/20/2014 2:45 PM

## 2014-03-11 ENCOUNTER — Ambulatory Visit (INDEPENDENT_AMBULATORY_CARE_PROVIDER_SITE_OTHER): Payer: Self-pay | Admitting: Cardiothoracic Surgery

## 2014-03-11 ENCOUNTER — Encounter: Payer: Self-pay | Admitting: Cardiothoracic Surgery

## 2014-03-11 VITALS — BP 150/76 | HR 88 | Resp 20 | Ht 70.0 in | Wt 245.0 lb

## 2014-03-11 DIAGNOSIS — Z951 Presence of aortocoronary bypass graft: Secondary | ICD-10-CM

## 2014-03-11 DIAGNOSIS — I2510 Atherosclerotic heart disease of native coronary artery without angina pectoris: Secondary | ICD-10-CM

## 2014-03-11 DIAGNOSIS — L03115 Cellulitis of right lower limb: Secondary | ICD-10-CM

## 2014-03-11 NOTE — Progress Notes (Signed)
PCP is Dorothey BasemanBRONSTEIN, [name], MD Referring Provider is Lamar BlinksKowalski, Bruce J, MD  Chief Complaint  Patient presents with  . Routine Post Op    3 week f/u for right leg at evh site    HPI: patient presents for final postop visit2 months after CABG x4. He is done well but did have problems with his vein harvest sites healing. The abdominal completely healed. He is ready to start cardiac rehabilitation which he will [facility]. He has no symptoms of angina or CHF. He still taking amiodarone 200 mg daily for postop A. Fib and he will stop that when his current prescription runs out.   Past Medical History  Diagnosis Date  . CAD (coronary artery disease)     a. 12/2006 NSTEMI/PCI: RCA 2473m/d->mid vessel stented, unable to cross dist dzs w/ balloon;  b. 11/2013 NL MV;  c. 11/2013 Echo: EF 55%;  d. 12/2013 Cath: LM 95 ost, LAD 70p, 1137m, LCX 30p/m, OM1 60, RCA 20p, 8043m/20m, 95d.  Marland Kitchen. HTN (hypertension)   . Hyperlipidemia   . Diabetes mellitus   . Carotid arterial disease     a. s/p LCEA  . Morbid obesity   . Myocardial infarction   . Pneumonia     Past Surgical History  Procedure Laterality Date  . Coronary angioplasty    . Coronary artery bypass graft N/A 01/03/2014    Procedure: CORONARY ARTERY BYPASS GRAFTING (CABG) x four, using left internal mammary and right leg greater saphenous vein harvested endoscopically;  Surgeon: Kerin PernaPeter Van Trigt, MD;  [facility];  Service: Open Heart Surgery;  Laterality: N/A;  . Tee without cardioversion N/A 01/03/2014    Procedure: TRANSESOPHAGEAL ECHOCARDIOGRAM (TEE);  Surgeon: Kerin PernaPeter Van Trigt, MD;  [facility];  Service: Open Heart Surgery;  Laterality: N/A;    Family History  Problem Relation Age of Onset  . Peripheral vascular disease Father     died in his 2290's - had CEA.  . Peripheral vascular disease Brother     s/p CEA  . Stroke Brother     in his 5050's  . COPD Mother     died in her 6190's.    Social  History History  Substance Use Topics  . Smoking status: Former Games developermoker  . Smokeless tobacco: Never Used     Comment: smoked for a few months in his early 20's.  . Alcohol Use: No    Current Outpatient Prescriptions  Medication Sig Dispense Refill  . amiodarone (PACERONE) 200 MG tablet Take 1 tablet (200 mg total) by mouth daily. 30 tablet 1  . Ascorbic Acid (VITAMIN C) 1000 MG tablet Take 1,000 mg by mouth daily.    Marland Kitchen. aspirin EC 325 MG tablet Take 1 tablet (325 mg total) by mouth every evening.    Marland Kitchen. atorvastatin (LIPITOR) 20 MG tablet Take 1 tablet (20 mg total) by mouth daily at 6 PM. 30 tablet 1  . carvedilol (COREG) 12.5 MG tablet Take 12.5 mg by mouth 2 (two) times daily with a meal.    . Cholecalciferol (VITAMIN D-3) 5000 UNITS TABS Take 5,000 mg by mouth daily.    . ferrous fumarate-b12-vitamic C-folic acid (TRINSICON / FOLTRIN) capsule Take 1 capsule by mouth daily. For one month then stop. 30 capsule 0  . KRILL OIL PO Take 1 tablet by mouth daily.    . Liraglutide (VICTOZA) 18 MG/3ML SOPN Inject 1.8 mg into the skin daily.    . metFORMIN (  GLUCOPHAGE) 1000 MG tablet Take 1,000 mg by mouth 2 (two) times daily with a meal.    . metoprolol tartrate (LOPRESSOR) 25 MG tablet Take 1 tablet (25 mg total) by mouth 2 (two) times daily. 60 tablet 1  . VITAMIN K PO Take 1 tablet by mouth daily.     No current facility-administered medications for this visit.    Allergies  Allergen Reactions  . Kiwi Extract Nausea And Vomiting and Other (See Comments)    Throat bumps on throat     Review of Systemsfeels well-no complaint. He is no longer requiring narcotics.  BP 150/76 mmHg  Pulse 88  Resp 20  Ht 5\' 10"  (1.778 m)  Wt 245 lb (111.131 kg)  BMI 35.15 kg/m2  SpO2 97% Physical Exam Alert comfortable Lungs clear Heart rhythm regular without murmur Lower extremities without edema or tenderness  Diagnostic Tests: Last chest x-ray reviewed his clear Impression: Doing well 2  months status post CABG x4  Plan: Patient is still under restriction for lifting and is limited to 20 pounds until mid January. He'll discontinue the amiodarone his current prescription runs out. He'll return as needed

## 2014-03-24 ENCOUNTER — Encounter: Payer: Self-pay | Admitting: Internal Medicine

## 2014-04-20 ENCOUNTER — Encounter: Payer: Self-pay | Admitting: Internal Medicine

## 2014-05-19 ENCOUNTER — Encounter
Admit: 2014-05-19 | Disposition: A | Discharge status: Home / Self Care | Payer: Self-pay | Attending: Internal Medicine | Admitting: Internal Medicine

## 2014-06-19 ENCOUNTER — Encounter
Admit: 2014-06-19 | Disposition: A | Discharge status: Home / Self Care | Payer: Self-pay | Attending: Internal Medicine | Admitting: Internal Medicine

## 2014-07-20 ENCOUNTER — Encounter: Payer: Medicare PPO | Attending: Internal Medicine

## 2014-08-05 ENCOUNTER — Telehealth: Payer: Self-pay | Admitting: Internal Medicine

## 2014-08-05 NOTE — Telephone Encounter (Signed)
VM checking on patient. His message said he won't return until May 23.

## 2014-08-09 ENCOUNTER — Encounter: Payer: Self-pay | Admitting: *Deleted

## 2014-08-09 NOTE — Progress Notes (Signed)
Input data from previous EMR to update the Individualized Treatment Plan. 

## 2014-08-11 ENCOUNTER — Encounter: Payer: Self-pay | Admitting: *Deleted

## 2014-08-11 NOTE — Progress Notes (Signed)
Cardiac Individual Treatment Plan  Patient Details  Name: Jake Castro MRN: [id number] Date of Birth: [birth year] Referring Provider:Dr. B. Gwen Pounds Initial Encounter Date:  04/03/2014 Diagnosis CABG  Patient's Home Medications on Admission:  Current outpatient prescriptions:  .  amiodarone (PACERONE) 200 MG tablet, Take 1 tablet (200 mg total) by mouth daily., Disp: 30 tablet, Rfl: 1 .  Ascorbic Acid (VITAMIN C) 1000 MG tablet, Take 1,000 mg by mouth daily., Disp: , Rfl:  .  aspirin EC 325 MG tablet, Take 1 tablet (325 mg total) by mouth every evening., Disp: , Rfl:  .  atorvastatin (LIPITOR) 20 MG tablet, Take 1 tablet (20 mg total) by mouth daily at 6 PM., Disp: 30 tablet, Rfl: 1 .  carvedilol (COREG) 12.5 MG tablet, Take 12.5 mg by mouth 2 (two) times daily with a meal., Disp: , Rfl:  .  Cholecalciferol (VITAMIN D-3) 5000 UNITS TABS, Take 5,000 mg by mouth daily., Disp: , Rfl:  .  ferrous fumarate-b12-vitamic C-folic acid (TRINSICON / FOLTRIN) capsule, Take 1 capsule by mouth daily. For one month then stop., Disp: 30 capsule, Rfl: 0 .  KRILL OIL PO, Take 1 tablet by mouth daily., Disp: , Rfl:  .  Liraglutide (VICTOZA) 18 MG/3ML SOPN, Inject 1.8 mg into the skin daily., Disp: , Rfl:  .  metFORMIN (GLUCOPHAGE) 1000 MG tablet, Take 1,000 mg by mouth 2 (two) times daily with a meal., Disp: , Rfl:  .  metoprolol tartrate (LOPRESSOR) 25 MG tablet, Take 1 tablet (25 mg total) by mouth 2 (two) times daily., Disp: 60 tablet, Rfl: 1 .  VITAMIN K PO, Take 1 tablet by mouth daily., Disp: , Rfl:   Past Medical History: Past Medical History  Diagnosis Date  . CAD (coronary artery disease)     a. 12/2006 NSTEMI/PCI: RCA 48m/d->mid vessel stented, unable to cross dist dzs w/ balloon;  b. 11/2013 NL MV;  c. 11/2013 Echo: EF 55%;  d. 12/2013 Cath: LM 95 ost, LAD 70p, 32m, LCX 30p/m, OM1 60, RCA 20p, 68m/67m, 95d.  Marland Kitchen HTN (hypertension)   . Hyperlipidemia   . Diabetes mellitus   . Carotid arterial  disease     a. s/p LCEA  . Morbid obesity   . Myocardial infarction   . Pneumonia     Tobacco Use: History  Smoking status  . Former Smoker  Smokeless tobacco  . Never Used    Comment: smoked for a few months in his early 20's.    Labs: Recent Review Flowsheet Data    Labs for ITP Cardiac and Pulmonary Rehab Latest Ref Rng 01/03/2014 01/03/2014 01/03/2014 01/03/2014 01/04/2014   PHART 7.350 - 7.450 7.313(L) 7.318(L) 7.344(L) - -   PCO2ART 35.0 - 45.0 mmHg 53.3(H) 61.2(HH) 52.4(H) - -   HCO3 20.0 - 24.0 mEq/L 27.0(H) 31.3(H) 28.5(H) - -   TCO2 0 - 100 mmol/L 29 33 O2SAT - 92.0 94.0 95.0 - -       Exercise Target Goals:    Exercise Program Goal: Individual exercise prescription set with THRR, safety & activity barriers. Participant demonstrates ability to understand and report RPE using BORG scale, to self-measure pulse accurately, and to acknowledge the importance of the exercise prescription.  Exercise Prescription Goal: Starting with aerobic activity 30 plus minutes a day, 3 days per week for initial exercise prescription. Provide home exercise prescription and guidelines that participant acknowledges understanding prior to discharge.  Activity Barriers & Risk Stratification:  Activity Barriers & Risk Stratification - 08/09/14 1052    Activity Barriers & Risk Stratification   Activity Barriers None   Risk Stratification High      6 Minute Walk:     6 Minute Walk      04/02/14 1053       6 Minute Walk   Phase Initial     Distance 1580 feet     Walk Time 6 minutes     Resting HR 76 bpm     Resting BP 112/64 mmHg     Max Ex. HR 123 bpm     Max Ex. BP 140/62 mmHg     RPE 15        Initial Exercise Prescription:   Exercise Prescription Changes:     Exercise Prescription Changes      08/10/14 1200           Exercise Review   Progression Yes       Response to Exercise   Blood Pressure (Admit) 144/70 mmHg       Blood Pressure  (Exercise) 146/68 mmHg       Blood Pressure (Exit) 126/74 mmHg       Heart Rate (Admit) 77 bpm       Heart Rate (Exercise) 85 bpm       Heart Rate (Exit) 88 bpm       Rating of Perceived Exertion (Exercise) 11       Frequency Add 1 additional day to program exercise sessions.       Duration Progress to 50 minutes of aerobic without signs/symptoms of physical distress       Intensity THRR unchanged       Progression Continue progressive overload as per policy without signs/symptoms or physical distress.       Resistance Training   Training Prescription Yes       Weight 8       Reps 10-12       Interval Training   Interval Training Yes       Equipment Treadmill       Treadmill   MPH 3.6       Grade 0       Minutes 30          Discharge Exercise Prescription:   Nutrition:  Target Goals: Understanding of nutrition guidelines, daily intake of sodium 1500mg , cholesterol 200mg , calories 30% from fat and 7% or less from saturated fats, daily to have 5 or more servings of fruits and vegetables.  Biometrics:     Pre Biometrics - 03/24/14 1057    Pre Biometrics   Height  (1.803 m)   Weight 247 lb 4.8 oz (112.175 kg)   Waist Circumference 48.5 inches   Hip Circumference 46 inches   Waist to Hip Ratio 1.05 %   BMI (Calculated) 34.6       Nutrition Therapy Plan and Nutrition Goals:     Nutrition Therapy & Goals - 05/14/14 1054    Nutrition Therapy   Diet Is following weight Watchers portions with Vegan and Ornish weight plans   Personal Nutrition Goals   Personal Goal #1 Is following Weight Watchers portions      Nutrition Discharge: Rate Your Plate Scores:   Nutrition Goals Re-Evaluation:   Psychosocial: Target Goals: Acknowledge presence or absence of depression, maximize coping skills, provide positive support system. Participant is able to verbalize types and ability to use techniques and skills needed for reducing stress and depression.  Initial Review  & Psychosocial Screening:   Quality of Life Scores:     Quality of Life - 04/02/14 1058    Quality of Life Scores   Health/Function Pre 25.29 %   Socioeconomic Pre 29.14 %   Psych/Spiritual Pre 24.86 %   Family Pre 27 %   GLOBAL Pre 26.25 %      PHQ-9:     Recent Review Flowsheet Data    There is no flowsheet data to display.      Psychosocial Evaluation and Intervention:   Psychosocial Re-Evaluation:   Vocational Rehabilitation: Provide vocational rehab assistance to qualifying candidates.   Vocational Rehab Evaluation & Intervention:     Vocational Rehab - 08/09/14 1053    Initial Vocational Rehab Evaluation & Intervention   Assessment shows need for Vocational Rehabilitation No      Education: Education Goals: Education classes will be provided on a weekly basis, covering required topics. Participant will state understanding/return demonstration of topics presented.  Learning Barriers/Preferences:     Learning Barriers/Preferences - 08/09/14 1052    Learning Barriers/Preferences   Learning Barriers None   Learning Preferences None      Education Topics: General Nutrition Guidelines/Fats and Fiber: -Group instruction provided by verbal, written material, models and posters to present the general guidelines for heart healthy nutrition. Gives an explanation and review of dietary fats and fiber.   Controlling Sodium/Reading Food Labels: -Group verbal and written material supporting the discussion of sodium use in heart healthy nutrition. Review and explanation with models, verbal and written materials for utilization of the food label.   Exercise Physiology & Risk Factors: - Group verbal and written instruction with models to review the exercise physiology of the cardiovascular system and associated critical values. Details cardiovascular disease risk factors and the goals associated with each risk factor.   Aerobic Exercise & Resistance Training: -  Gives group verbal and written discussion on the health impact of inactivity. On the components of aerobic and resistive training programs and the benefits of this training and how to safely progress through these programs.   Flexibility, Balance, General Exercise Guidelines: - Provides group verbal and written instruction on the benefits of flexibility and balance training programs. Provides general exercise guidelines with specific guidelines to those with heart or lung disease. Demonstration and skill practice provided.   Stress Management: - Provides group verbal and written instruction about the health risks of elevated stress, cause of high stress, and healthy ways to reduce stress.   Depression: - Provides group verbal and written instruction on the correlation between heart/lung disease and depressed mood, treatment options, and the stigmas associated with seeking treatment.   Anatomy & Physiology of the Heart: - Group verbal and written instruction and models provide basic cardiac anatomy and physiology, with the coronary electrical and arterial systems. Review of: AMI, Angina, Valve disease, Heart Failure, Cardiac Arrhythmia, Pacemakers, and the ICD.   Cardiac Procedures: - Group verbal and written instruction and models to describe the testing methods done to diagnose heart disease. Reviews the outcomes of the test results. Describes the treatment choices: Medical Management, Angioplasty, or Coronary Bypass Surgery.   Cardiac Medications: - Group verbal and written instruction to review commonly prescribed medications for heart disease. Reviews the medication, class of the drug, and side effects. Includes the steps to properly store meds and maintain the prescription regimen.   Go Sex-Intimacy & Heart Disease, Get SMART - Goal Setting: - Group verbal and written instruction through game format  to discuss heart disease and the return to sexual intimacy. Provides group verbal and  written material to discuss and apply goal setting through the application of the S.M.A.R.T. Method.   Other Matters of the Heart: - Provides group verbal, written materials and models to describe Heart Failure, Angina, Valve Disease, and Diabetes in the realm of heart disease. Includes description of the disease process and treatment options available to the cardiac patient.   Exercise & Equipment Safety: - Individual verbal instruction and demonstration of equipment use and safety with use of the equipment.   Infection Prevention: - Provides verbal and written material to individual with discussion of infection control including proper hand washing and proper equipment cleaning during exercise session.   Falls Prevention: - Provides verbal and written material to individual with discussion of falls prevention and safety.   Diabetes: - Individual verbal and written instruction to review signs/symptoms of diabetes, desired ranges of glucose level fasting, after meals and with exercise. Advice that pre and post exercise glucose checks will be done for 3 sessions at entry of program.    Knowledge Questionnaire Score:     Knowledge Questionnaire Score - 04/02/14 1053    Knowledge Questionnaire Score   Pre Score 26/28      Personal Goals and Risk Factors at Admission:     Personal Goals and Risk Factors at Admission - 08/09/14 1057    Personal Goals and Risk Factors on Admission    Weight Management Yes;Obesity   Intervention Learn and follow the exercise and diet guidelines while in the program. Utilize the nutrition and education classes to help gain knowledge of the diet and exercise expectations in the program   Intervention Provide weight management tools through evaluation completed by registered dietician and exercise physiologist.  Establish a goal weight with participant.   Increase Aerobic Exercise and Physical Activity Yes   Intervention While in program, learn and  follow the exercise prescription taught. Start at a low level workload and increase workload after able to maintain previous level for 30 minutes. Increase time before increasing intensity.   Take Less Medication Yes   Intervention Learn your risk factors and begin the lifestyle modifications for risk factor control during your time in the program.   Understand more about Heart/Pulmonary Disease. Yes   Intervention While in program utilize professionals for any questions, and attend the education sessions. Great websites to use are www.americanheart.org or www.lung.org for reliable information.   Diabetes Yes   Goal Blood glucose control identified by blood glucose values, HgbA1C. Participant verbalizes understanding of the signs/symptoms of hyper/hypo glycemia, proper foot care and importance of medication and nutrition plan for blood glucose control.   Intervention Provide nutrition & aerobic exercise along with prescribed medications to achieve blood glucose in normal ranges: Fasting 65-99 mg/dL   Hypertension Yes   Goal Participant will see blood pressure controlled within the values of 140/59mm/Hg or within value directed by their physician.   Intervention Provide nutrition & aerobic exercise along with prescribed medications to achieve BP 140/90 or less.   Lipids Yes   Goal Cholesterol controlled with medications as prescribed, with individualized exercise RX and with personalized nutrition plan. Value goals: LDL < 70mg , HDL > 40mg . Participant states understanding of desired cholesterol values and following prescriptions.   Intervention Provide nutrition & aerobic exercise along with prescribed medications to achieve LDL 70mg , HDL >40mg .      Personal Goals and Risk Factors Review:      Goals  and Risk Factor Review      08/11/14 1822           Weight Management   Goals Progress/Improvement seen No       Comments Weight is going up. Jake Castro is concerned and plans to talk to his doctor  about Medications that may causse weight gain       Increase Aerobic Exercise and Physical Activity   Goals Progress/Improvement seen  No       Comments Does not attend class on a regular basis, is not receiving full benifit of exercise program       Take Less Medication   Goals Progress/Improvement seen No       Understand more about Heart/Pulmonary Disease   Goals Progress/Improvement seen  Yes       Comments Does atten classes when here and does ask question as needed       Diabetes   Progress seen towards goals Yes       Comments Blood sugar usually in normal range when checked in program       Hypertension   Progress seen toward goals Yes       Comments BP normally in good controlled range       Abnormal Lipids   Progress seen towards goals Unknown          Personal Goals Discharge:     Comments: 30 day review  Jake Castro has sporadic attendance

## 2014-08-13 ENCOUNTER — Other Ambulatory Visit: Payer: Self-pay | Admitting: *Deleted

## 2014-08-13 DIAGNOSIS — Z951 Presence of aortocoronary bypass graft: Secondary | ICD-10-CM

## 2014-08-14 ENCOUNTER — Encounter: Payer: Medicare PPO | Attending: Internal Medicine | Admitting: *Deleted

## 2014-08-14 DIAGNOSIS — Z951 Presence of aortocoronary bypass graft: Secondary | ICD-10-CM

## 2014-08-14 LAB — GLUCOSE, CAPILLARY: GLUCOSE-CAPILLARY: 140 mg/dL — AB (ref 65–99)

## 2014-08-14 NOTE — Progress Notes (Signed)
Daily Session Note  Patient Details  Name: NICHOLA WARREN MRN: [id number] Date of Birth: 08/30/1950 Referring Provider:  Corey Skains, MD  Encounter Date: 08/14/2014  Check In:     Session Check In - 08/14/14 0942    Check-In   Staff Present Heath Lark RN, BSN, CCRP;Renee Dillard Essex MS, ACSM CEP Exercise Physiologist;Carroll Enterkin RN, BSN   ER physicians immediately available to respond to emergencies See telemetry face sheet for immediately available ER MD   Medication changes reported     No   Fall or balance concerns reported    No   Warm-up and Cool-down Performed on first and last piece of equipment   VAD Patient? No   Pain Assessment   Currently in Pain? No/denies         Goals Met:  Independence with exercise equipment Exercise tolerated well No report of cardiac concerns or symptoms Strength training completed today  Goals Unmet:  Not Applicable  Goals Comments:  [name] returns after lengthy time away.  He is working on [employer] using [employer]   Dr. Emily Filbert is Medical Director for [facility] and [facility].

## 2014-08-19 ENCOUNTER — Encounter: Payer: Medicare PPO | Attending: Internal Medicine

## 2014-08-19 DIAGNOSIS — Z951 Presence of aortocoronary bypass graft: Secondary | ICD-10-CM | POA: Insufficient documentation

## 2014-08-20 ENCOUNTER — Encounter: Payer: Medicare PPO | Admitting: *Deleted

## 2014-08-20 DIAGNOSIS — Z951 Presence of aortocoronary bypass graft: Secondary | ICD-10-CM | POA: Diagnosis not present

## 2014-08-20 NOTE — Progress Notes (Signed)
Daily Session Note  Patient Details  Name: MARIUS BETTS MRN: [id number] Date of Birth: March 15, 1951 Referring Provider:  Corey Skains, MD  Encounter Date: 08/20/2014  Check In:     Session Check In - 08/20/14 1752    Check-In   Staff Present Nyoka Cowden RN;Carroll Enterkin RN, BSN;Diane Joya Gaskins RN, BSN   ER physicians immediately available to respond to emergencies See telemetry face sheet for immediately available ER MD   Medication changes reported     No   Fall or balance concerns reported    No   Warm-up and Cool-down Performed on first and last piece of equipment   VAD Patient? No   Pain Assessment   Currently in Pain? No/denies   Multiple Pain Sites No         Goals Met:  Independence with exercise equipment Achieving weight loss Exercise tolerated well No report of cardiac concerns or symptoms  Goals Unmet:  Not Applicable  Goals Comments:    Dr. Emily Filbert is Medical Director for [facility] and [facility].

## 2014-08-21 ENCOUNTER — Encounter: Payer: Medicare PPO | Admitting: *Deleted

## 2014-08-21 DIAGNOSIS — Z951 Presence of aortocoronary bypass graft: Secondary | ICD-10-CM

## 2014-08-21 NOTE — Progress Notes (Signed)
Daily Session Note  Patient Details  Name: Jake Castro MRN: [id number] Date of Birth: [birth year] Referring Provider:  Corey Skains, MD  Encounter Date: 08/21/2014  Check In:     Session Check In - 08/21/14 0922    Check-In   Staff Present Heath Lark RN, BSN, CCRP;Renee Dillard Essex MS, ACSM CEP Exercise Physiologist;Carroll Enterkin RN, BSN   ER physicians immediately available to respond to emergencies See telemetry face sheet for immediately available ER MD   Medication changes reported     No   Fall or balance concerns reported    No   Warm-up and Cool-down Performed on first and last piece of equipment   VAD Patient? No   Pain Assessment   Currently in Pain? No/denies   Multiple Pain Sites No         Goals Met:  Independence with exercise equipment Exercise tolerated well No report of cardiac concerns or symptoms Strength training completed today  Goals Unmet:  Not Applicable  Goals Comments:    Dr. Emily Filbert is Medical Director for [facility] and [facility].

## 2014-08-24 ENCOUNTER — Encounter: Payer: Medicare PPO | Admitting: *Deleted

## 2014-08-24 DIAGNOSIS — Z951 Presence of aortocoronary bypass graft: Secondary | ICD-10-CM | POA: Diagnosis not present

## 2014-08-24 NOTE — Progress Notes (Signed)
Daily Session Note  Patient Details  Name: Jake Castro MRN: [id number] Date of Birth: 12-15-1950 Referring Provider:  Juluis Pitch, MD  Encounter Date: 08/24/2014  Check In:     Session Check In - 08/24/14 0855    Check-In   Staff Present Candiss Norse MS, ACSM CEP Exercise Physiologist;Susanne Bice RN, BSN, CCRP;Damante Spragg Alfonso Patten, ACSM CEP Exercise Physiologist   ER physicians immediately available to respond to emergencies See telemetry face sheet for immediately available ER MD   Medication changes reported     No   Fall or balance concerns reported    No   Warm-up and Cool-down Performed on first and last piece of equipment   VAD Patient? No   Pain Assessment   Currently in Pain? No/denies   Multiple Pain Sites No         Goals Met:  Independence with exercise equipment Exercise tolerated well No report of cardiac concerns or symptoms  Goals Unmet:  Not Applicable  Goals Comments:    Dr. Emily Filbert is Medical Director for [facility].

## 2014-08-27 DIAGNOSIS — Z951 Presence of aortocoronary bypass graft: Secondary | ICD-10-CM

## 2014-08-27 NOTE — Progress Notes (Signed)
Daily Session Note  Patient Details  Name: Jake Castro MRN: [id number] Date of Birth: May 22, 1950 Referring Provider:  Juluis Pitch, MD  Encounter Date: 08/27/2014  Check In:     Session Check In - 08/27/14 1007    Check-In   Staff Present Gerlene Burdock RN, BSN;Avion Patella BS, ACSM EP-C, Exercise Physiologist   ER physicians immediately available to respond to emergencies See telemetry face sheet for immediately available ER MD   Medication changes reported     No   Fall or balance concerns reported    No   Warm-up and Cool-down Performed on first and last piece of equipment   VAD Patient? No   Pain Assessment   Currently in Pain? No/denies         Goals Met:  Proper associated with RPD/PD & O2 Sat Exercise tolerated well No report of cardiac concerns or symptoms Strength training completed today  Goals Unmet:  Not Applicable  Goals Comments:    Dr. Emily Filbert is Medical Director for [facility] and [facility].

## 2014-08-27 NOTE — Addendum Note (Signed)
Addended by: Virgina Organ on: 08/27/2014 02:39 PM   Modules accepted: Orders, Medications

## 2014-08-28 ENCOUNTER — Encounter: Payer: Medicare PPO | Admitting: *Deleted

## 2014-08-28 DIAGNOSIS — Z951 Presence of aortocoronary bypass graft: Secondary | ICD-10-CM

## 2014-08-28 NOTE — Progress Notes (Signed)
Daily Session Note  Patient Details  Name: Duilio H Lwin MRN: [id number] Date of Birth: 12/21/1950 Referring Provider:  [name], Bruce J, MD  Encounter Date: 08/28/2014  Check In:     Session Check In - 08/28/14 0905    Check-In   Staff Present Mary Jo Abernethy RN;Carroll Enterkin RN, BSN;Renee MacMillan MS, ACSM CEP Exercise Physiologist   ER physicians immediately available to respond to emergencies See telemetry face sheet for immediately available ER MD   Medication changes reported     No   Fall or balance concerns reported    No   Warm-up and Cool-down Performed on first and last piece of equipment   VAD Patient? No   Pain Assessment   Currently in Pain? No/denies         Goals Met:  Proper associated with RPD/PD & O2 Sat Exercise tolerated well  Goals Unmet:  Not Applicable  Goals Comments:    Dr. Mark Miller is Medical Director for [facility] and [facility]. 

## 2014-09-01 ENCOUNTER — Encounter: Payer: Medicare PPO | Admitting: *Deleted

## 2014-09-01 DIAGNOSIS — Z951 Presence of aortocoronary bypass graft: Secondary | ICD-10-CM

## 2014-09-01 NOTE — Progress Notes (Signed)
Daily Session Note  Patient Details  Name: Jake Castro MRN: [id number] Date of Birth: 1950/06/25 Referring Provider:  Juluis Pitch, MD  Encounter Date: 09/01/2014  Check In:     Session Check In - 09/01/14 1112    Check-In   Staff Present Nyoka Cowden RN;Kahmari Herard Dillard Essex MS, ACSM CEP Exercise Physiologist   ER physicians immediately available to respond to emergencies See telemetry face sheet for immediately available ER MD   Medication changes reported     No   Fall or balance concerns reported    No   Warm-up and Cool-down Performed on first and last piece of equipment   VAD Patient? No   Pain Assessment   Currently in Pain? No/denies   Multiple Pain Sites No         Goals Met:  Independence with exercise equipment Exercise tolerated well Personal goals reviewed No report of cardiac concerns or symptoms Strength training completed today  Goals Unmet:  Not Applicable  Goals Comments:   Dr. Emily Filbert is Medical Director for [facility] and [facility].

## 2014-09-04 ENCOUNTER — Encounter: Payer: Medicare PPO | Admitting: *Deleted

## 2014-09-04 DIAGNOSIS — Z951 Presence of aortocoronary bypass graft: Secondary | ICD-10-CM

## 2014-09-04 LAB — GLUCOSE, CAPILLARY: GLUCOSE-CAPILLARY: 100 mg/dL — AB (ref 65–99)

## 2014-09-04 NOTE — Progress Notes (Signed)
Daily Session Note  Patient Details  Name: ENOS MUHL MRN: [id number] Date of Birth: 12/06/1950 Referring Provider:  Juluis Pitch, MD  Encounter Date: 09/04/2014  Check In:     Session Check In - 09/04/14 0911    Check-In   Staff Present Heath Lark RN, BSN, CCRP;Carroll Enterkin RN, Drusilla Kanner MS, ACSM CEP Exercise Physiologist   ER physicians immediately available to respond to emergencies See telemetry face sheet for immediately available ER MD   Medication changes reported     No   Fall or balance concerns reported    No   Warm-up and Cool-down Performed on first and last piece of equipment   VAD Patient? No   Pain Assessment   Currently in Pain? No/denies   Multiple Pain Sites No         Goals Met:  Proper associated with RPD/PD & O2 Sat Exercise tolerated well No report of cardiac concerns or symptoms Strength training completed today  Goals Unmet:  Not Applicable  Goals Comments:   Dr. Emily Filbert is Medical Director for [facility] and [facility].

## 2014-09-08 ENCOUNTER — Encounter: Payer: Medicare PPO | Admitting: *Deleted

## 2014-09-08 ENCOUNTER — Encounter: Payer: Self-pay | Admitting: *Deleted

## 2014-09-08 DIAGNOSIS — Z951 Presence of aortocoronary bypass graft: Secondary | ICD-10-CM

## 2014-09-08 NOTE — Progress Notes (Signed)
Daily Session Note  Patient Details  Name: Jake Castro MRN: [id number] Date of Birth: 1951-03-04 Referring Provider:  Corey Skains, MD  Encounter Date: 09/08/2014  Check In:     Session Check In - 09/08/14 1101    Check-In   Staff Present Diane Joya Gaskins RN, Drusilla Kanner MS, ACSM CEP Exercise Physiologist   ER physicians immediately available to respond to emergencies See telemetry face sheet for immediately available ER MD   Medication changes reported     No   Fall or balance concerns reported    No   Warm-up and Cool-down Performed on first and last piece of equipment   VAD Patient? No   Pain Assessment   Currently in Pain? No/denies   Multiple Pain Sites No           Exercise Prescription Changes - 09/08/14 0600    Exercise Review   Progression Yes   Response to Exercise   Blood Pressure (Admit) 140/90 mmHg   Blood Pressure (Exercise) 182/82 mmHg   Blood Pressure (Exit) 132/70 mmHg   Heart Rate (Admit) 73 bpm   Heart Rate (Exercise) 98 bpm   Heart Rate (Exit) 88 bpm   Rating of Perceived Exertion (Exercise) 13   Frequency --   Duration Progress to 50 minutes of aerobic without signs/symptoms of physical distress   Intensity THRR unchanged   Progression Continue progressive overload as per policy without signs/symptoms or physical distress.   Resistance Training   Training Prescription Yes   Weight 8   Reps 10-12   Interval Training   Interval Training Yes   Equipment Treadmill;Recumbant Elliptical   Comments TM 3.5-3.8/0%; REL L4.5/45W-160W   Treadmill   MPH 3.8   Grade 0   Minutes 30   Recumbant Elliptical   Level 4.5   RPM 45   Watts 45   Minutes 30      Goals Met:  Independence with exercise equipment Exercise tolerated well No report of cardiac concerns or symptoms  Goals Unmet:  Not Applicable  Goals Comments:    Dr. Emily Filbert is Medical Director for [place] and [facility].

## 2014-09-08 NOTE — Progress Notes (Signed)
Cardiac Individual Treatment Plan  Patient Details  Name: Jake Castro MRN: [id number] Date of Birth: 08-Aug-1950 Referring Provider:  Dr. Marya Amsler Initial Encounter Date:  04/03/2014  Visit Diagnosis: S/P CABG (coronary artery bypass graft)  Patient's Home Medications on Admission:  Current outpatient prescriptions:  .  amiodarone (PACERONE) 200 MG tablet, Take 1 tablet (200 mg total) by mouth daily., Disp: 30 tablet, Rfl: 1 .  Ascorbic Acid (VITAMIN C) 1000 MG tablet, Take 1,000 mg by mouth daily., Disp: , Rfl:  .  aspirin EC 325 MG tablet, Take 1 tablet (325 mg total) by mouth every evening., Disp: , Rfl:  .  atorvastatin (LIPITOR) 20 MG tablet, Take 1 tablet (20 mg total) by mouth daily at 6 PM., Disp: 30 tablet, Rfl: 1 .  carvedilol (COREG) 12.5 MG tablet, Take 12.5 mg by mouth 2 (two) times daily with a meal., Disp: , Rfl:  .  Cholecalciferol (VITAMIN D-3) 5000 UNITS TABS, Take 5,000 mg by mouth daily., Disp: , Rfl:  .  ferrous fumarate-b12-vitamic C-folic acid (TRINSICON / FOLTRIN) capsule, Take 1 capsule by mouth daily. For one month then stop., Disp: 30 capsule, Rfl: 0 .  KRILL OIL PO, Take 1 tablet by mouth daily., Disp: , Rfl:  .  Liraglutide (VICTOZA) 18 MG/3ML SOPN, Inject 1.8 mg into the skin daily., Disp: , Rfl:  .  metFORMIN (GLUCOPHAGE) 1000 MG tablet, Take 1,000 mg by mouth 2 (two) times daily with a meal., Disp: , Rfl:  .  VITAMIN K PO, Take 1 tablet by mouth daily., Disp: , Rfl:   Past Medical History: Past Medical History  Diagnosis Date  . CAD (coronary artery disease)     a. 12/2006 NSTEMI/PCI: RCA 69m/d->mid vessel stented, unable to cross dist dzs w/ balloon;  b. 11/2013 NL MV;  c. 11/2013 Echo: EF 55%;  d. 12/2013 Cath: LM 95 ost, LAD 70p, 68m, LCX 30p/m, OM1 60, RCA 20p, 76m/56m, 95d.  Marland Kitchen HTN (hypertension)   . Hyperlipidemia   . Diabetes mellitus   . Carotid arterial disease     a. s/p LCEA  . Morbid obesity   . Myocardial infarction   . Pneumonia      Tobacco Use: History  Smoking status  . Former Smoker  Smokeless tobacco  . Never Used    Comment: smoked for a few months in his early 20's.    Labs: Recent Review Flowsheet Data    Labs for ITP Cardiac and Pulmonary Rehab Latest Ref Rng 01/03/2014 01/03/2014 01/03/2014 01/03/2014 01/04/2014   PHART 7.350 - 7.450 7.313(L) 7.318(L) 7.344(L) - -   PCO2ART 35.0 - 45.0 mmHg 53.3(H) 61.2(HH) 52.4(H) - -   HCO3 20.0 - 24.0 mEq/L 27.0(H) 31.3(H) 28.5(H) - -   TCO2 0 - 100 mmol/L 29 33 30 30 26    O2SAT - 92.0 94.0 95.0 - -       Exercise Target Goals:    Exercise Program Goal: Individual exercise prescription set with THRR, safety & activity barriers. Participant demonstrates ability to understand and report RPE using BORG scale, to self-measure pulse accurately, and to acknowledge the importance of the exercise prescription.  Exercise Prescription Goal: Starting with aerobic activity 30 plus minutes a day, 3 days per week for initial exercise prescription. Provide home exercise prescription and guidelines that participant acknowledges understanding prior to discharge.  Activity Barriers & Risk Stratification:     Activity Barriers & Risk Stratification - 08/09/14 1052    Activity Barriers & Risk Stratification  Activity Barriers None   Risk Stratification High      6 Minute Walk:     6 Minute Walk      04/02/14 1053       6 Minute Walk   Phase Initial     Distance 1580 feet     Walk Time 6 minutes     Resting HR 76 bpm     Resting BP 112/64 mmHg     Max Ex. HR 123 bpm     Max Ex. BP 140/62 mmHg     RPE 15        Initial Exercise Prescription:   Exercise Prescription Changes:     Exercise Prescription Changes      08/10/14 1200 09/08/14 0600         Exercise Review   Progression Yes Yes      Response to Exercise   Blood Pressure (Admit) 144/70 mmHg 140/90 mmHg      Blood Pressure (Exercise) 146/68 mmHg 182/82 mmHg      Blood Pressure (Exit)  126/74 mmHg 132/70 mmHg      Heart Rate (Admit) 77 bpm 73 bpm      Heart Rate (Exercise) 85 bpm 98 bpm      Heart Rate (Exit) 88 bpm 88 bpm      Rating of Perceived Exertion (Exercise) 11 13      Frequency Add 1 additional day to program exercise sessions. --      Duration Progress to 50 minutes of aerobic without signs/symptoms of physical distress Progress to 50 minutes of aerobic without signs/symptoms of physical distress      Intensity THRR unchanged THRR unchanged      Progression Continue progressive overload as per policy without signs/symptoms or physical distress. Continue progressive overload as per policy without signs/symptoms or physical distress.      Resistance Training   Training Prescription Yes Yes      Weight 8 8      Reps 10-12 10-12      Interval Training   Interval Training Yes Yes      Equipment Treadmill Treadmill;Recumbant Elliptical      Comments  TM 3.5-3.8/0%; REL L4.5/45W-160W      Treadmill   MPH 3.6 3.8      Grade 0 0      Minutes 30 30      Recumbant Elliptical   Level  4.5      RPM  45      Watts  45      Minutes  30         Discharge Exercise Prescription (Final Exercise Prescription Changes):     Exercise Prescription Changes - 09/08/14 0600    Exercise Review   Progression Yes   Response to Exercise   Blood Pressure (Admit) 140/90 mmHg   Blood Pressure (Exercise) 182/82 mmHg   Blood Pressure (Exit) 132/70 mmHg   Heart Rate (Admit) 73 bpm   Heart Rate (Exercise) 98 bpm   Heart Rate (Exit) 88 bpm   Rating of Perceived Exertion (Exercise) 13   Frequency --   Duration Progress to 50 minutes of aerobic without signs/symptoms of physical distress   Intensity THRR unchanged   Progression Continue progressive overload as per policy without signs/symptoms or physical distress.   Resistance Training   Training Prescription Yes   Weight 8   Reps 10-12   Interval Training   Interval Training Yes   Equipment Treadmill;Recumbant Elliptical  Comments TM 3.5-3.8/0%; REL L4.5/45W-160W   Treadmill   MPH 3.8   Grade 0   Minutes 30   Recumbant Elliptical   Level 4.5   RPM 45   Watts 45   Minutes 30      Nutrition:  Target Goals: Understanding of nutrition guidelines, daily intake of sodium 1500mg , cholesterol 200mg , calories 30% from fat and 7% or less from saturated fats, daily to have 5 or more servings of fruits and vegetables.  Biometrics:     Pre Biometrics - 03/24/14 1057    Pre Biometrics   Height  (1.803 m)   Weight 247 lb 4.8 oz (112.175 kg)   Waist Circumference 48.5 inches   Hip Circumference 46 inches   Waist to Hip Ratio 1.05 %   BMI (Calculated) 34.6       Nutrition Therapy Plan and Nutrition Goals:     Nutrition Therapy & Goals - 05/14/14 1054    Nutrition Therapy   Diet Is following weight Watchers portions with Vegan and Ornish weight plans   Personal Nutrition Goals   Personal Goal #1 Is following Weight Watchers portions      Nutrition Discharge: Rate Your Plate Scores:   Nutrition Goals Re-Evaluation:     Nutrition Goals Re-Evaluation      08/27/14 1437           Personal Goal #1 Re-Evaluation   Personal Goal #1 Is eating 3 Grams of Protein at each meal. He is also eating vegetables and more fruti. Has lost 8 lbs!       Goal Progress Seen Yes          Psychosocial: Target Goals: Acknowledge presence or absence of depression, maximize coping skills, provide positive support system. Participant is able to verbalize types and ability to use techniques and skills needed for reducing stress and depression.  Initial Review & Psychosocial Screening:   Quality of Life Scores:     Quality of Life - 04/02/14 1058    Quality of Life Scores   Health/Function Pre 25.29 %   Socioeconomic Pre 29.14 %   Psych/Spiritual Pre 24.86 %   Family Pre 27 %   GLOBAL Pre 26.25 %      PHQ-9:     Recent Review Flowsheet Data    There is no flowsheet data to display.       Psychosocial Evaluation and Intervention:   Psychosocial Re-Evaluation:   Vocational Rehabilitation: Provide vocational rehab assistance to qualifying candidates.   Vocational Rehab Evaluation & Intervention:     Vocational Rehab - 08/09/14 1053    Initial Vocational Rehab Evaluation & Intervention   Assessment shows need for Vocational Rehabilitation No      Education: Education Goals: Education classes will be provided on a weekly basis, covering required topics. Participant will state understanding/return demonstration of topics presented.  Learning Barriers/Preferences:     Learning Barriers/Preferences - 08/09/14 1052    Learning Barriers/Preferences   Learning Barriers None   Learning Preferences None      Education Topics: General Nutrition Guidelines/Fats and Fiber: -Group instruction provided by verbal, written material, models and posters to present the general guidelines for heart healthy nutrition. Gives an explanation and review of dietary fats and fiber.   Controlling Sodium/Reading Food Labels: -Group verbal and written material supporting the discussion of sodium use in heart healthy nutrition. Review and explanation with models, verbal and written materials for utilization of the food label.   Exercise Physiology & Risk  Factors: - Group verbal and written instruction with models to review the exercise physiology of the cardiovascular system and associated critical values. Details cardiovascular disease risk factors and the goals associated with each risk factor.   Aerobic Exercise & Resistance Training: - Gives group verbal and written discussion on the health impact of inactivity. On the components of aerobic and resistive training programs and the benefits of this training and how to safely progress through these programs.   Flexibility, Balance, General Exercise Guidelines: - Provides group verbal and written instruction on the benefits of  flexibility and balance training programs. Provides general exercise guidelines with specific guidelines to those with heart or lung disease. Demonstration and skill practice provided.   Stress Management: - Provides group verbal and written instruction about the health risks of elevated stress, cause of high stress, and healthy ways to reduce stress.   Depression: - Provides group verbal and written instruction on the correlation between heart/lung disease and depressed mood, treatment options, and the stigmas associated with seeking treatment.   Anatomy & Physiology of the Heart: - Group verbal and written instruction and models provide basic cardiac anatomy and physiology, with the coronary electrical and arterial systems. Review of: AMI, Angina, Valve disease, Heart Failure, Cardiac Arrhythmia, Pacemakers, and the ICD.   Cardiac Procedures: - Group verbal and written instruction and models to describe the testing methods done to diagnose heart disease. Reviews the outcomes of the test results. Describes the treatment choices: Medical Management, Angioplasty, or Coronary Bypass Surgery.   Cardiac Medications: - Group verbal and written instruction to review commonly prescribed medications for heart disease. Reviews the medication, class of the drug, and side effects. Includes the steps to properly store meds and maintain the prescription regimen.   Go Sex-Intimacy & Heart Disease, Get SMART - Goal Setting: - Group verbal and written instruction through game format to discuss heart disease and the return to sexual intimacy. Provides group verbal and written material to discuss and apply goal setting through the application of the S.M.A.R.T. Method.   Other Matters of the Heart: - Provides group verbal, written materials and models to describe Heart Failure, Angina, Valve Disease, and Diabetes in the realm of heart disease. Includes description of the disease process and treatment  options available to the cardiac patient.   Exercise & Equipment Safety: - Individual verbal instruction and demonstration of equipment use and safety with use of the equipment.   Infection Prevention: - Provides verbal and written material to individual with discussion of infection control including proper hand washing and proper equipment cleaning during exercise session.   Falls Prevention: - Provides verbal and written material to individual with discussion of falls prevention and safety.   Diabetes: - Individual verbal and written instruction to review signs/symptoms of diabetes, desired ranges of glucose level fasting, after meals and with exercise. Advice that pre and post exercise glucose checks will be done for 3 sessions at entry of program.    Knowledge Questionnaire Score:     Knowledge Questionnaire Score - 04/02/14 1053    Knowledge Questionnaire Score   Pre Score 26/28      Personal Goals and Risk Factors at Admission:     Personal Goals and Risk Factors at Admission - 08/09/14 1057    Personal Goals and Risk Factors on Admission    Weight Management Yes;Obesity   Intervention Learn and follow the exercise and diet guidelines while in the program. Utilize the nutrition and education classes to help gain  knowledge of the diet and exercise expectations in the program   Intervention Provide weight management tools through evaluation completed by registered dietician and exercise physiologist.  Establish a goal weight with participant.   Increase Aerobic Exercise and Physical Activity Yes   Intervention While in program, learn and follow the exercise prescription taught. Start at a low level workload and increase workload after able to maintain previous level for 30 minutes. Increase time before increasing intensity.   Take Less Medication Yes   Intervention Learn your risk factors and begin the lifestyle modifications for risk factor control during your time in the  program.   Understand more about Heart/Pulmonary Disease. Yes   Intervention While in program utilize professionals for any questions, and attend the education sessions. Great websites to use are www.americanheart.org or www.lung.org for reliable information.   Diabetes Yes   Goal Blood glucose control identified by blood glucose values, HgbA1C. Participant verbalizes understanding of the signs/symptoms of hyper/hypo glycemia, proper foot care and importance of medication and nutrition plan for blood glucose control.   Intervention Provide nutrition & aerobic exercise along with prescribed medications to achieve blood glucose in normal ranges: Fasting 65-99 mg/dL   Hypertension Yes   Goal Participant will see blood pressure controlled within the values of 140/49mm/Hg or within value directed by their physician.   Intervention Provide nutrition & aerobic exercise along with prescribed medications to achieve BP 140/90 or less.   Lipids Yes   Goal Cholesterol controlled with medications as prescribed, with individualized exercise RX and with personalized nutrition plan. Value goals: LDL < 70mg , HDL > 40mg . Participant states understanding of desired cholesterol values and following prescriptions.   Intervention Provide nutrition & aerobic exercise along with prescribed medications to achieve LDL 70mg , HDL >40mg .      Personal Goals and Risk Factors Review:      Goals and Risk Factor Review      08/11/14 1822 08/27/14 1438         Weight Management   Goals Progress/Improvement seen No Yes      Comments Weight is going up. Jake Castro is concerned and plans to talk to his doctor about Medications that may causse weight gain Is eating 3 Grams of Protein at each meal. He is also eating vegetables and more fruti. Has lost 8 lbs!      Increase Aerobic Exercise and Physical Activity   Goals Progress/Improvement seen  No Yes      Comments Does not attend class on a regular basis, is not receiving full  benifit of exercise program       Take Less Medication   Goals Progress/Improvement seen No Yes      Understand more about Heart/Pulmonary Disease   Goals Progress/Improvement seen  Yes Yes      Comments Does atten classes when here and does ask question as needed       Diabetes   Progress seen towards goals Yes       Comments Blood sugar usually in normal range when checked in program       Hypertension   Progress seen toward goals Yes       Comments BP normally in good controlled range       Abnormal Lipids   Progress seen towards goals Unknown          Personal Goals Discharge:     Comments: 30 day review. Continue with ITP.

## 2014-09-09 ENCOUNTER — Other Ambulatory Visit: Payer: Self-pay | Admitting: *Deleted

## 2014-09-09 DIAGNOSIS — Z951 Presence of aortocoronary bypass graft: Secondary | ICD-10-CM

## 2014-09-10 ENCOUNTER — Encounter: Payer: Self-pay | Admitting: *Deleted

## 2014-09-10 DIAGNOSIS — Z951 Presence of aortocoronary bypass graft: Secondary | ICD-10-CM

## 2014-09-10 NOTE — Progress Notes (Signed)
Daily Session Note  Patient Details  Name: Jake Castro MRN: [id number] Date of Birth: 1950-10-30 Referring Provider:  Juluis Pitch, MD  Encounter Date: 09/10/2014  Check In:     Session Check In - 09/10/14 1625    Check-In   Staff Present Lestine Box BS, ACSM EP-C, Exercise Physiologist;Carroll Enterkin RN, BSN;Diane Joya Gaskins RN, BSN   ER physicians immediately available to respond to emergencies See telemetry face sheet for immediately available ER MD   Medication changes reported     No   Fall or balance concerns reported    No   Warm-up and Cool-down Performed on first and last piece of equipment   VAD Patient? No   Pain Assessment   Currently in Pain? No/denies         Goals Met:  Proper associated with RPD/PD & O2 Sat Exercise tolerated well No report of cardiac concerns or symptoms Strength training completed today  Goals Unmet:  Not Applicable  Goals Comments:    Dr. Emily Filbert is Medical Director for [place] and [facility].

## 2014-09-15 ENCOUNTER — Encounter: Payer: Medicare PPO | Admitting: *Deleted

## 2014-09-15 DIAGNOSIS — Z951 Presence of aortocoronary bypass graft: Secondary | ICD-10-CM

## 2014-09-15 NOTE — Progress Notes (Signed)
Daily Session Note  Patient Details  Name: MONTREAL STEIDLE MRN: [id number] Date of Birth: Jan 27, 1951 Referring Provider:  Corey Skains, MD  Encounter Date: 09/15/2014  Check In:     Session Check In - 09/15/14 1053    Check-In   Staff Present Candiss Norse MS, ACSM CEP Exercise Physiologist;Lynee Rosenbach Mariana Arn, BSN   ER physicians immediately available to respond to emergencies See telemetry face sheet for immediately available ER MD   Medication changes reported     No   Fall or balance concerns reported    No   Warm-up and Cool-down Performed on first and last piece of equipment   VAD Patient? No   Pain Assessment   Currently in Pain? No/denies   Multiple Pain Sites No         Goals Met:  Independence with exercise equipment Exercise tolerated well No report of cardiac concerns or symptoms  Goals Unmet:  Not Applicable  Goals Comments: Tolerated session well.  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     Dr. Emily Filbert is Medical Director for [place] and [facility].

## 2014-09-23 ENCOUNTER — Encounter: Payer: Medicare PPO | Attending: Internal Medicine

## 2014-09-23 DIAGNOSIS — Z951 Presence of aortocoronary bypass graft: Secondary | ICD-10-CM | POA: Insufficient documentation

## 2014-10-06 ENCOUNTER — Encounter: Payer: Self-pay | Admitting: *Deleted

## 2014-10-06 DIAGNOSIS — Z951 Presence of aortocoronary bypass graft: Secondary | ICD-10-CM

## 2014-10-06 NOTE — Progress Notes (Signed)
Cardiac Individual Treatment Plan  Patient Details  Name: Jake Castro MRN: [id number] Date of Birth: May 30, 1950 Referring Provider:  Lamar Blinks, MD  Initial Encounter Date:    Visit Diagnosis: S/P CABG (coronary artery bypass graft)  Patient's Home Medications on Admission:  Current outpatient prescriptions:  .  amiodarone (PACERONE) 200 MG tablet, Take 1 tablet (200 mg total) by mouth daily., Disp: 30 tablet, Rfl: 1 .  Ascorbic Acid (VITAMIN C) 1000 MG tablet, Take 1,000 mg by mouth daily., Disp: , Rfl:  .  aspirin EC 325 MG tablet, Take 1 tablet (325 mg total) by mouth every evening., Disp: , Rfl:  .  atorvastatin (LIPITOR) 20 MG tablet, Take 1 tablet (20 mg total) by mouth daily at 6 PM., Disp: 30 tablet, Rfl: 1 .  carvedilol (COREG) 12.5 MG tablet, Take 12.5 mg by mouth 2 (two) times daily with a meal., Disp: , Rfl:  .  Cholecalciferol (VITAMIN D-3) 5000 UNITS TABS, Take 5,000 mg by mouth daily., Disp: , Rfl:  .  ferrous fumarate-b12-vitamic C-folic acid (TRINSICON / FOLTRIN) capsule, Take 1 capsule by mouth daily. For one month then stop., Disp: 30 capsule, Rfl: 0 .  KRILL OIL PO, Take 1 tablet by mouth daily., Disp: , Rfl:  .  Liraglutide (VICTOZA) 18 MG/3ML SOPN, Inject 1.8 mg into the skin daily., Disp: , Rfl:  .  metFORMIN (GLUCOPHAGE) 1000 MG tablet, Take 1,000 mg by mouth 2 (two) times daily with a meal., Disp: , Rfl:  .  VITAMIN K PO, Take 1 tablet by mouth daily., Disp: , Rfl:   Past Medical History: Past Medical History  Diagnosis Date  . CAD (coronary artery disease)     a. 12/2006 NSTEMI/PCI: RCA 75m/d->mid vessel stented, unable to cross dist dzs w/ balloon;  b. 11/2013 NL MV;  c. 11/2013 Echo: EF 55%;  d. 12/2013 Cath: LM 95 ost, LAD 70p, 32m, LCX 30p/m, OM1 60, RCA 20p, 48m/85m, 95d.  Marland Kitchen HTN (hypertension)   . Hyperlipidemia   . Diabetes mellitus   . Carotid arterial disease     a. s/p LCEA  . Morbid obesity   . Myocardial infarction   . Pneumonia      Tobacco Use: History  Smoking status  . Former Smoker  Smokeless tobacco  . Never Used    Comment: smoked for a few months in his early 20's.    Labs: Recent Review Flowsheet Data    Labs for ITP Cardiac and Pulmonary Rehab Latest Ref Rng 01/03/2014 01/03/2014 01/03/2014 01/03/2014 01/04/2014   PHART 7.350 - 7.450 7.313(L) 7.318(L) 7.344(L) - -   PCO2ART 35.0 - 45.0 mmHg 53.3(H) 61.2(HH) 52.4(H) - -   HCO3 20.0 - 24.0 mEq/L 27.0(H) 31.3(H) 28.5(H) - -   TCO2 0 - 100 mmol/L 29 33 O2SAT - 92.0 94.0 95.0 - -       Exercise Target Goals:    Exercise Program Goal: Individual exercise prescription set with THRR, safety & activity barriers. Participant demonstrates ability to understand and report RPE using BORG scale, to self-measure pulse accurately, and to acknowledge the importance of the exercise prescription.  Exercise Prescription Goal: Starting with aerobic activity 30 plus minutes a day, 3 days per week for initial exercise prescription. Provide home exercise prescription and guidelines that participant acknowledges understanding prior to discharge.  Activity Barriers & Risk Stratification:     Activity Barriers & Risk Stratification - 08/09/14 1052    Activity Barriers &  Risk Stratification   Activity Barriers None   Risk Stratification High      6 Minute Walk:   Initial Exercise Prescription:   Exercise Prescription Changes:     Exercise Prescription Changes      08/10/14 1200 09/08/14 0600         Exercise Review   Progression Yes Yes      Response to Exercise   Blood Pressure (Admit) 144/70 mmHg 140/90 mmHg      Blood Pressure (Exercise) 146/68 mmHg 182/82 mmHg      Blood Pressure (Exit) 126/74 mmHg 132/70 mmHg      Heart Rate (Admit) 77 bpm 73 bpm      Heart Rate (Exercise) 85 bpm 98 bpm      Heart Rate (Exit) 88 bpm 88 bpm      Rating of Perceived Exertion (Exercise) 11 13      Frequency Add 1 additional day to program exercise  sessions. --      Duration Progress to 50 minutes of aerobic without signs/symptoms of physical distress Progress to 50 minutes of aerobic without signs/symptoms of physical distress      Intensity THRR unchanged THRR unchanged      Progression Continue progressive overload as per policy without signs/symptoms or physical distress. Continue progressive overload as per policy without signs/symptoms or physical distress.      Resistance Training   Training Prescription Yes Yes      Weight 8 8      Reps 10-12 10-12      Interval Training   Interval Training Yes Yes      Equipment Treadmill Treadmill;Recumbant Elliptical      Comments  TM 3.5-3.8/0%; REL L4.5/45W-160W      Treadmill   MPH 3.6 3.8      Grade 0 0      Minutes 30 30      Recumbant Elliptical   Level  4.5      RPM  45      Watts  45      Minutes  30         Discharge Exercise Prescription (Final Exercise Prescription Changes):     Exercise Prescription Changes - 09/08/14 0600    Exercise Review   Progression Yes   Response to Exercise   Blood Pressure (Admit) 140/90 mmHg   Blood Pressure (Exercise) 182/82 mmHg   Blood Pressure (Exit) 132/70 mmHg   Heart Rate (Admit) 73 bpm   Heart Rate (Exercise) 98 bpm   Heart Rate (Exit) 88 bpm   Rating of Perceived Exertion (Exercise) 13   Frequency --   Duration Progress to 50 minutes of aerobic without signs/symptoms of physical distress   Intensity THRR unchanged   Progression Continue progressive overload as per policy without signs/symptoms or physical distress.   Resistance Training   Training Prescription Yes   Weight 8   Reps 10-12   Interval Training   Interval Training Yes   Equipment Treadmill;Recumbant Elliptical   Comments TM 3.5-3.8/0%; REL L4.5/45W-160W   Treadmill   MPH 3.8   Grade 0   Minutes 30   Recumbant Elliptical   Level 4.5   RPM 45   Watts 45   Minutes 30      Nutrition:  Target Goals: Understanding of nutrition guidelines, daily  intake of sodium 1500mg , cholesterol 200mg , calories 30% from fat and 7% or less from saturated fats, daily to have 5 or more servings of fruits and vegetables.  Biometrics:    Nutrition Therapy Plan and Nutrition Goals:     Nutrition Therapy & Goals - 05/14/14 1054    Nutrition Therapy   Diet Is following weight Watchers portions with Vegan and Ornish weight plans   Personal Nutrition Goals   Personal Goal #1 Is following Weight Watchers portions      Nutrition Discharge: Rate Your Plate Scores:     Rate Your Plate - 16/10/96 1219    Rate Your Plate Scores   Post Score 75   Post Score % 83 %      Nutrition Goals Re-Evaluation:     Nutrition Goals Re-Evaluation      08/27/14 1437 10/06/14 1335         Personal Goal #1 Re-Evaluation   Personal Goal #1 Is eating 3 Grams of Protein at each meal. He is also eating vegetables and more fruti. Has lost 8 lbs! Is eating 3 Grams of Protein at each meal. He is also eating vegetables and more fruti. Has lost 8 lbs!      Goal Progress Seen Yes Yes         Psychosocial: Target Goals: Acknowledge presence or absence of depression, maximize coping skills, provide positive support system. Participant is able to verbalize types and ability to use techniques and skills needed for reducing stress and depression.  Initial Review & Psychosocial Screening:   Quality of Life Scores:     Quality of Life - 09/10/14 1220    Quality of Life Scores   Health/Function Post 27.6 %   Socioeconomic Post 27.81 %   Psych/Spiritual Post 26.79 %   Family Post 26.6 %   GLOBAL Post 27.34 %      PHQ-9:     Recent Review Flowsheet Data    Depression screen [facility] 2/9 09/10/2014   Decreased Interest 0   Down, Depressed, Hopeless 0   PHQ - 2 Score 0   Altered sleeping 0   Tired, decreased energy 0   Change in appetite 0   Feeling bad or failure about yourself  0   Trouble concentrating 0   Moving slowly or fidgety/restless 0   Suicidal  thoughts 0   PHQ-9 Score 0      Psychosocial Evaluation and Intervention:   Psychosocial Re-Evaluation:   Vocational Rehabilitation: Provide vocational rehab assistance to qualifying candidates.   Vocational Rehab Evaluation & Intervention:     Vocational Rehab - 08/09/14 1053    Initial Vocational Rehab Evaluation & Intervention   Assessment shows need for Vocational Rehabilitation No      Education: Education Goals: Education classes will be provided on a weekly basis, covering required topics. Participant will state understanding/return demonstration of topics presented.  Learning Barriers/Preferences:     Learning Barriers/Preferences - 08/09/14 1052    Learning Barriers/Preferences   Learning Barriers None   Learning Preferences None      Education Topics: General Nutrition Guidelines/Fats and Fiber: -Group instruction provided by verbal, written material, models and posters to present the general guidelines for heart healthy nutrition. Gives an explanation and review of dietary fats and fiber.   Controlling Sodium/Reading Food Labels: -Group verbal and written material supporting the discussion of sodium use in heart healthy nutrition. Review and explanation with models, verbal and written materials for utilization of the food label.   Exercise Physiology & Risk Factors: - Group verbal and written instruction with models to review the exercise physiology of the cardiovascular system and associated critical values. Details cardiovascular  disease risk factors and the goals associated with each risk factor.   Aerobic Exercise & Resistance Training: - Gives group verbal and written discussion on the health impact of inactivity. On the components of aerobic and resistive training programs and the benefits of this training and how to safely progress through these programs.   Flexibility, Balance, General Exercise Guidelines: - Provides group verbal and written  instruction on the benefits of flexibility and balance training programs. Provides general exercise guidelines with specific guidelines to those with heart or lung disease. Demonstration and skill practice provided.   Stress Management: - Provides group verbal and written instruction about the health risks of elevated stress, cause of high stress, and healthy ways to reduce stress.   Depression: - Provides group verbal and written instruction on the correlation between heart/lung disease and depressed mood, treatment options, and the stigmas associated with seeking treatment.   Anatomy & Physiology of the Heart: - Group verbal and written instruction and models provide basic cardiac anatomy and physiology, with the coronary electrical and arterial systems. Review of: AMI, Angina, Valve disease, Heart Failure, Cardiac Arrhythmia, Pacemakers, and the ICD.   Cardiac Procedures: - Group verbal and written instruction and models to describe the testing methods done to diagnose heart disease. Reviews the outcomes of the test results. Describes the treatment choices: Medical Management, Angioplasty, or Coronary Bypass Surgery.   Cardiac Medications: - Group verbal and written instruction to review commonly prescribed medications for heart disease. Reviews the medication, class of the drug, and side effects. Includes the steps to properly store meds and maintain the prescription regimen.   Go Sex-Intimacy & Heart Disease, Get SMART - Goal Setting: - Group verbal and written instruction through game format to discuss heart disease and the return to sexual intimacy. Provides group verbal and written material to discuss and apply goal setting through the application of the S.M.A.R.T. Method.   Other Matters of the Heart: - Provides group verbal, written materials and models to describe Heart Failure, Angina, Valve Disease, and Diabetes in the realm of heart disease. Includes description of the  disease process and treatment options available to the cardiac patient.   Exercise & Equipment Safety: - Individual verbal instruction and demonstration of equipment use and safety with use of the equipment.   Infection Prevention: - Provides verbal and written material to individual with discussion of infection control including proper hand washing and proper equipment cleaning during exercise session.   Falls Prevention: - Provides verbal and written material to individual with discussion of falls prevention and safety.   Diabetes: - Individual verbal and written instruction to review signs/symptoms of diabetes, desired ranges of glucose level fasting, after meals and with exercise. Advice that pre and post exercise glucose checks will be done for 3 sessions at entry of program.    Knowledge Questionnaire Score:     Knowledge Questionnaire Score - 09/10/14 1217    Knowledge Questionnaire Score   Post Score 28      Personal Goals and Risk Factors at Admission:     Personal Goals and Risk Factors at Admission - 08/09/14 1057    Personal Goals and Risk Factors on Admission    Weight Management Yes;Obesity   Intervention Learn and follow the exercise and diet guidelines while in the program. Utilize the nutrition and education classes to help gain knowledge of the diet and exercise expectations in the program   Intervention Provide weight management tools through evaluation completed by registered dietician and  exercise physiologist.  Establish a goal weight with participant.   Increase Aerobic Exercise and Physical Activity Yes   Intervention While in program, learn and follow the exercise prescription taught. Start at a low level workload and increase workload after able to maintain previous level for 30 minutes. Increase time before increasing intensity.   Take Less Medication Yes   Intervention Learn your risk factors and begin the lifestyle modifications for risk factor  control during your time in the program.   Understand more about Heart/Pulmonary Disease. Yes   Intervention While in program utilize professionals for any questions, and attend the education sessions. Great websites to use are www.americanheart.org or www.lung.org for reliable information.   Diabetes Yes   Goal Blood glucose control identified by blood glucose values, HgbA1C. Participant verbalizes understanding of the signs/symptoms of hyper/hypo glycemia, proper foot care and importance of medication and nutrition plan for blood glucose control.   Intervention Provide nutrition & aerobic exercise along with prescribed medications to achieve blood glucose in normal ranges: Fasting 65-99 mg/dL   Hypertension Yes   Goal Participant will see blood pressure controlled within the values of 140/4490mm/Hg or within value directed by their physician.   Intervention Provide nutrition & aerobic exercise along with prescribed medications to achieve BP 140/90 or less.   Lipids Yes   Goal Cholesterol controlled with medications as prescribed, with individualized exercise RX and with personalized nutrition plan. Value goals: LDL < 70mg , HDL > 40mg . Participant states understanding of desired cholesterol values and following prescriptions.   Intervention Provide nutrition & aerobic exercise along with prescribed medications to achieve LDL 70mg , HDL >40mg .      Personal Goals and Risk Factors Review:      Goals and Risk Factor Review      08/11/14 1822 08/27/14 1438 10/06/14 1335       Weight Management   Goals Progress/Improvement seen No Yes Yes     Comments Weight is going up. Nadine CountsBob is concerned and plans to talk to his doctor about Medications that may causse weight gain Is eating 3 Grams of Protein at each meal. He is also eating vegetables and more fruti. Has lost 8 lbs! Continues to lose weight with nutrition plan he is following     Increase Aerobic Exercise and Physical Activity   Goals  Progress/Improvement seen  No Yes Yes     Comments Does not attend class on a regular basis, is not receiving full benifit of exercise program  Does not attend class on a regular basis, is not receiving full benifit of exercise program  Has progressed when in program     Take Less Medication   Goals Progress/Improvement seen No Yes No     Understand more about Heart/Pulmonary Disease   Goals Progress/Improvement seen  Yes Yes Yes     Comments Does atten classes when here and does ask question as needed  Does atten classes when here and does ask question as needed     Diabetes   Goal   Blood glucose control identified by blood glucose values, HgbA1C. Participant verbalizes understanding of the signs/symptoms of hyper/hypo glycemia, proper foot care and importance of medication and nutrition plan for blood glucose control.     Progress seen towards goals Yes  Yes     Comments Blood sugar usually in normal range when checked in program  Blood sugar usually in normal range when checked in program     Hypertension   Goal   Participant  will see blood pressure controlled within the values of 140/53mm/Hg or within value directed by their physician.     Progress seen toward goals Yes  Yes     Comments BP normally in good controlled range  BP normally in good controlled range     Abnormal Lipids   Goal   Cholesterol controlled with medications as prescribed, with individualized exercise RX and with personalized nutrition plan. Value goals: LDL < 70mg , HDL > 40mg . Participant states understanding of desired cholesterol values and following prescriptions.     Progress seen towards goals Unknown  Unknown     Comments   no labs to review        Personal Goals Discharge:     Comments: 30 day review. Continue with ITP.

## 2014-10-07 ENCOUNTER — Other Ambulatory Visit: Payer: Self-pay | Admitting: *Deleted

## 2014-10-07 DIAGNOSIS — Z951 Presence of aortocoronary bypass graft: Secondary | ICD-10-CM

## 2014-10-23 ENCOUNTER — Telehealth: Payer: Self-pay | Admitting: *Deleted

## 2014-10-23 NOTE — Telephone Encounter (Signed)
Would like to come for one more session and then d/c from program due to his schedule becoming too busy.

## 2014-10-27 ENCOUNTER — Encounter: Payer: Self-pay | Admitting: *Deleted

## 2014-10-29 ENCOUNTER — Encounter: Payer: Self-pay | Admitting: *Deleted

## 2014-10-29 DIAGNOSIS — Z951 Presence of aortocoronary bypass graft: Secondary | ICD-10-CM

## 2014-10-29 NOTE — Progress Notes (Signed)
Cardiac Individual Treatment Plan  Patient Details  Name: Jake Castro MRN: [id number] Date of Birth: Jan 08, 1951 Referring Provider:  Lamar Blinks, MD  Initial Encounter Date:    Visit Diagnosis: S/P CABG (coronary artery bypass graft)  Patient's Home Medications on Admission:  Current outpatient prescriptions:  .  amiodarone (PACERONE) 200 MG tablet, Take 1 tablet (200 mg total) by mouth daily., Disp: 30 tablet, Rfl: 1 .  Ascorbic Acid (VITAMIN C) 1000 MG tablet, Take 1,000 mg by mouth daily., Disp: , Rfl:  .  aspirin EC 325 MG tablet, Take 1 tablet (325 mg total) by mouth every evening., Disp: , Rfl:  .  atorvastatin (LIPITOR) 20 MG tablet, Take 1 tablet (20 mg total) by mouth daily at 6 PM., Disp: 30 tablet, Rfl: 1 .  carvedilol (COREG) 12.5 MG tablet, Take 12.5 mg by mouth 2 (two) times daily with a meal., Disp: , Rfl:  .  Cholecalciferol (VITAMIN D-3) 5000 UNITS TABS, Take 5,000 mg by mouth daily., Disp: , Rfl:  .  ferrous fumarate-b12-vitamic C-folic acid (TRINSICON / FOLTRIN) capsule, Take 1 capsule by mouth daily. For one month then stop., Disp: 30 capsule, Rfl: 0 .  KRILL OIL PO, Take 1 tablet by mouth daily., Disp: , Rfl:  .  Liraglutide (VICTOZA) 18 MG/3ML SOPN, Inject 1.8 mg into the skin daily., Disp: , Rfl:  .  metFORMIN (GLUCOPHAGE) 1000 MG tablet, Take 1,000 mg by mouth 2 (two) times daily with a meal., Disp: , Rfl:  .  VITAMIN K PO, Take 1 tablet by mouth daily., Disp: , Rfl:   Past Medical History: Past Medical History  Diagnosis Date  . CAD (coronary artery disease)     a. 12/2006 NSTEMI/PCI: RCA 40m/d->mid vessel stented, unable to cross dist dzs w/ balloon;  b. 11/2013 NL MV;  c. 11/2013 Echo: EF 55%;  d. 12/2013 Cath: LM 95 ost, LAD 70p, 71m, LCX 30p/m, OM1 60, RCA 20p, 26m/66m, 95d.  Marland Kitchen HTN (hypertension)   . Hyperlipidemia   . Diabetes mellitus   . Carotid arterial disease     a. s/p LCEA  . Morbid obesity   . Myocardial infarction   . Pneumonia      Tobacco Use: History  Smoking status  . Former Smoker  Smokeless tobacco  . Never Used    Comment: smoked for a few months in his early 20's.    Labs: Recent Review Flowsheet Data    Labs for ITP Cardiac and Pulmonary Rehab Latest Ref Rng 01/03/2014 01/03/2014 01/03/2014 01/03/2014 01/04/2014   PHART 7.350 - 7.450 7.313(L) 7.318(L) 7.344(L) - -   PCO2ART 35.0 - 45.0 mmHg 53.3(H) 61.2(HH) 52.4(H) - -   HCO3 20.0 - 24.0 mEq/L 27.0(H) 31.3(H) 28.5(H) - -   TCO2 0 - 100 mmol/L 29 33 30 30 26    O2SAT - 92.0 94.0 95.0 - -       Exercise Target Goals:    Exercise Program Goal: Individual exercise prescription set with THRR, safety & activity barriers. Participant demonstrates ability to understand and report RPE using BORG scale, to self-measure pulse accurately, and to acknowledge the importance of the exercise prescription.  Exercise Prescription Goal: Starting with aerobic activity 30 plus minutes a day, 3 days per week for initial exercise prescription. Provide home exercise prescription and guidelines that participant acknowledges understanding prior to discharge.  Activity Barriers & Risk Stratification:   6 Minute Walk:   Initial Exercise Prescription:   Exercise Prescription Changes:  Exercise Prescription Changes      09/08/14 0600 10/27/14 0600         Exercise Review   Progression Yes No  Absent since last review, last visit 09/15/14       Response to Exercise   Blood Pressure (Admit) 140/90 mmHg 122/70 mmHg      Blood Pressure (Exercise) 182/82 mmHg 154/72 mmHg      Blood Pressure (Exit) 132/70 mmHg 118/68 mmHg      Heart Rate (Admit) 73 bpm 63 bpm      Heart Rate (Exercise) 98 bpm 116 bpm      Heart Rate (Exit) 88 bpm       Rating of Perceived Exertion (Exercise) 13 13      Frequency --       Duration Progress to 50 minutes of aerobic without signs/symptoms of physical distress Progress to 50 minutes of aerobic without signs/symptoms of  physical distress      Intensity THRR unchanged THRR unchanged      Progression Continue progressive overload as per policy without signs/symptoms or physical distress. Continue progressive overload as per policy without signs/symptoms or physical distress.      Resistance Training   Training Prescription Yes Yes      Weight 8 8      Reps 10-12 10-12      Interval Training   Interval Training Yes Yes      Equipment Treadmill;Recumbant Elliptical Treadmill;Recumbant Elliptical      Comments TM 3.5-3.8/0%; REL L4.5/45W-160W TM 3.5-3.8/0%; REL L4.5/45W-160W      Treadmill   MPH 3.8 3.8      Grade 0 0      Minutes 30 30      Recumbant Elliptical   Level 4.5 4.5      RPM 45 45      Watts 45 45      Minutes 30 30         Discharge Exercise Prescription (Final Exercise Prescription Changes):     Exercise Prescription Changes - 10/27/14 0600    Exercise Review   Progression No  Absent since last review, last visit 09/15/14    Response to Exercise   Blood Pressure (Admit) 122/70 mmHg   Blood Pressure (Exercise) 154/72 mmHg   Blood Pressure (Exit) 118/68 mmHg   Heart Rate (Admit) 63 bpm   Heart Rate (Exercise) 116 bpm   Rating of Perceived Exertion (Exercise) 13   Duration Progress to 50 minutes of aerobic without signs/symptoms of physical distress   Intensity THRR unchanged   Progression Continue progressive overload as per policy without signs/symptoms or physical distress.   Resistance Training   Training Prescription Yes   Weight 8   Reps 10-12   Interval Training   Interval Training Yes   Equipment Treadmill;Recumbant Elliptical   Comments TM 3.5-3.8/0%; REL L4.5/45W-160W   Treadmill   MPH 3.8   Grade 0   Minutes 30   Recumbant Elliptical   Level 4.5   RPM 45   Watts 45   Minutes 30      Nutrition:  Target Goals: Understanding of nutrition guidelines, daily intake of sodium 1500mg , cholesterol 200mg , calories 30% from fat and 7% or less from saturated fats,  daily to have 5 or more servings of fruits and vegetables.  Biometrics:    Nutrition Therapy Plan and Nutrition Goals:   Nutrition Discharge: Rate Your Plate Scores:     Rate Your Plate - 09/81/19 1219  Rate Your Plate Scores   Post Score 75   Post Score % 83 %      Nutrition Goals Re-Evaluation:     Nutrition Goals Re-Evaluation      08/27/14 1437 10/06/14 1335         Personal Goal #1 Re-Evaluation   Personal Goal #1 Is eating 3 Grams of Protein at each meal. He is also eating vegetables and more fruti. Has lost 8 lbs! Is eating 3 Grams of Protein at each meal. He is also eating vegetables and more fruti. Has lost 8 lbs!      Goal Progress Seen Yes Yes         Psychosocial: Target Goals: Acknowledge presence or absence of depression, maximize coping skills, provide positive support system. Participant is able to verbalize types and ability to use techniques and skills needed for reducing stress and depression.  Initial Review & Psychosocial Screening:   Quality of Life Scores:     Quality of Life - 09/10/14 1220    Quality of Life Scores   Health/Function Post 27.6 %   Socioeconomic Post 27.81 %   Psych/Spiritual Post 26.79 %   Family Post 26.6 %   GLOBAL Post 27.34 %      PHQ-9:     Recent Review Flowsheet Data    Depression screen [facility] 2/9 09/10/2014   Decreased Interest 0   Down, Depressed, Hopeless 0   PHQ - 2 Score 0   Altered sleeping 0   Tired, decreased energy 0   Change in appetite 0   Feeling bad or failure about yourself  0   Trouble concentrating 0   Moving slowly or fidgety/restless 0   Suicidal thoughts 0   PHQ-9 Score 0      Psychosocial Evaluation and Intervention:   Psychosocial Re-Evaluation:   Vocational Rehabilitation: Provide vocational rehab assistance to qualifying candidates.   Vocational Rehab Evaluation & Intervention:   Education: Education Goals: Education classes will be provided on a weekly basis,  covering required topics. Participant will state understanding/return demonstration of topics presented.  Learning Barriers/Preferences:   Education Topics: General Nutrition Guidelines/Fats and Fiber: -Group instruction provided by verbal, written material, models and posters to present the general guidelines for heart healthy nutrition. Gives an explanation and review of dietary fats and fiber.   Controlling Sodium/Reading Food Labels: -Group verbal and written material supporting the discussion of sodium use in heart healthy nutrition. Review and explanation with models, verbal and written materials for utilization of the food label.   Exercise Physiology & Risk Factors: - Group verbal and written instruction with models to review the exercise physiology of the cardiovascular system and associated critical values. Details cardiovascular disease risk factors and the goals associated with each risk factor.   Aerobic Exercise & Resistance Training: - Gives group verbal and written discussion on the health impact of inactivity. On the components of aerobic and resistive training programs and the benefits of this training and how to safely progress through these programs.   Flexibility, Balance, General Exercise Guidelines: - Provides group verbal and written instruction on the benefits of flexibility and balance training programs. Provides general exercise guidelines with specific guidelines to those with heart or lung disease. Demonstration and skill practice provided.   Stress Management: - Provides group verbal and written instruction about the health risks of elevated stress, cause of high stress, and healthy ways to reduce stress.   Depression: - Provides group verbal and written instruction on the  correlation between heart/lung disease and depressed mood, treatment options, and the stigmas associated with seeking treatment.   Anatomy & Physiology of the Heart: - Group verbal  and written instruction and models provide basic cardiac anatomy and physiology, with the coronary electrical and arterial systems. Review of: AMI, Angina, Valve disease, Heart Failure, Cardiac Arrhythmia, Pacemakers, and the ICD.   Cardiac Procedures: - Group verbal and written instruction and models to describe the testing methods done to diagnose heart disease. Reviews the outcomes of the test results. Describes the treatment choices: Medical Management, Angioplasty, or Coronary Bypass Surgery.   Cardiac Medications: - Group verbal and written instruction to review commonly prescribed medications for heart disease. Reviews the medication, class of the drug, and side effects. Includes the steps to properly store meds and maintain the prescription regimen.   Go Sex-Intimacy & Heart Disease, Get SMART - Goal Setting: - Group verbal and written instruction through game format to discuss heart disease and the return to sexual intimacy. Provides group verbal and written material to discuss and apply goal setting through the application of the S.M.A.R.T. Method.   Other Matters of the Heart: - Provides group verbal, written materials and models to describe Heart Failure, Angina, Valve Disease, and Diabetes in the realm of heart disease. Includes description of the disease process and treatment options available to the cardiac patient.   Exercise & Equipment Safety: - Individual verbal instruction and demonstration of equipment use and safety with use of the equipment.   Infection Prevention: - Provides verbal and written material to individual with discussion of infection control including proper hand washing and proper equipment cleaning during exercise session.   Falls Prevention: - Provides verbal and written material to individual with discussion of falls prevention and safety.   Diabetes: - Individual verbal and written instruction to review signs/symptoms of diabetes, desired ranges  of glucose level fasting, after meals and with exercise. Advice that pre and post exercise glucose checks will be done for 3 sessions at entry of program.    Knowledge Questionnaire Score:     Knowledge Questionnaire Score - 09/10/14 1217    Knowledge Questionnaire Score   Post Score 28      Personal Goals and Risk Factors at Admission:   Personal Goals and Risk Factors Review:      Goals and Risk Factor Review      08/27/14 1438 10/06/14 1335         Weight Management   Goals Progress/Improvement seen Yes Yes      Comments Is eating 3 Grams of Protein at each meal. He is also eating vegetables and more fruti. Has lost 8 lbs! Continues to lose weight with nutrition plan he is following      Increase Aerobic Exercise and Physical Activity   Goals Progress/Improvement seen  Yes Yes      Comments  Does not attend class on a regular basis, is not receiving full benifit of exercise program  Has progressed when in program      Take Less Medication   Goals Progress/Improvement seen Yes No      Understand more about Heart/Pulmonary Disease   Goals Progress/Improvement seen  Yes Yes      Comments  Does atten classes when here and does ask question as needed      Diabetes   Goal  Blood glucose control identified by blood glucose values, HgbA1C. Participant verbalizes understanding of the signs/symptoms of hyper/hypo glycemia, proper foot care and importance of  medication and nutrition plan for blood glucose control.      Progress seen towards goals  Yes      Comments  Blood sugar usually in normal range when checked in program      Hypertension   Goal  Participant will see blood pressure controlled within the values of 140/24mm/Hg or within value directed by their physician.      Progress seen toward goals  Yes      Comments  BP normally in good controlled range      Abnormal Lipids   Goal  Cholesterol controlled with medications as prescribed, with individualized exercise RX and  with personalized nutrition plan. Value goals: LDL < 70mg , HDL > 40mg . Participant states understanding of desired cholesterol values and following prescriptions.      Progress seen towards goals  Unknown      Comments  no labs to review         Personal Goals Discharge:     Comments: 30 day review Nadine Counts has been out since last review. Will return to class soon.

## 2014-11-04 ENCOUNTER — Other Ambulatory Visit: Payer: Self-pay | Admitting: *Deleted

## 2014-11-04 DIAGNOSIS — Z951 Presence of aortocoronary bypass graft: Secondary | ICD-10-CM

## 2014-11-04 NOTE — Progress Notes (Signed)
Cardiac Individual Treatment Plan  Patient Details  Name: Jake Castro MRN: [id number] Date of Birth: 11/04/1950 Referring Provider:  No ref. provider found  Initial Encounter Date:    Visit Diagnosis: No diagnosis found.  Patient's Home Medications on Admission:  Current outpatient prescriptions:  .  amiodarone (PACERONE) 200 MG tablet, Take 1 tablet (200 mg total) by mouth daily., Disp: 30 tablet, Rfl: 1 .  Ascorbic Acid (VITAMIN C) 1000 MG tablet, Take 1,000 mg by mouth daily., Disp: , Rfl:  .  aspirin EC 325 MG tablet, Take 1 tablet (325 mg total) by mouth every evening., Disp: , Rfl:  .  atorvastatin (LIPITOR) 20 MG tablet, Take 1 tablet (20 mg total) by mouth daily at 6 PM., Disp: 30 tablet, Rfl: 1 .  carvedilol (COREG) 12.5 MG tablet, Take 12.5 mg by mouth 2 (two) times daily with a meal., Disp: , Rfl:  .  Cholecalciferol (VITAMIN D-3) 5000 UNITS TABS, Take 5,000 mg by mouth daily., Disp: , Rfl:  .  ferrous fumarate-b12-vitamic C-folic acid (TRINSICON / FOLTRIN) capsule, Take 1 capsule by mouth daily. For one month then stop., Disp: 30 capsule, Rfl: 0 .  KRILL OIL PO, Take 1 tablet by mouth daily., Disp: , Rfl:  .  Liraglutide (VICTOZA) 18 MG/3ML SOPN, Inject 1.8 mg into the skin daily., Disp: , Rfl:  .  metFORMIN (GLUCOPHAGE) 1000 MG tablet, Take 1,000 mg by mouth 2 (two) times daily with a meal., Disp: , Rfl:  .  VITAMIN K PO, Take 1 tablet by mouth daily., Disp: , Rfl:   Past Medical History: Past Medical History  Diagnosis Date  . CAD (coronary artery disease)     a. 12/2006 NSTEMI/PCI: RCA 23m/d->mid vessel stented, unable to cross dist dzs w/ balloon;  b. 11/2013 NL MV;  c. 11/2013 Echo: EF 55%;  d. 12/2013 Cath: LM 95 ost, LAD 70p, 35m, LCX 30p/m, OM1 60, RCA 20p, 74m/47m, 95d.  Marland Kitchen HTN (hypertension)   . Hyperlipidemia   . Diabetes mellitus   . Carotid arterial disease     a. s/p LCEA  . Morbid obesity   . Myocardial infarction   . Pneumonia     Tobacco  Use: History  Smoking status  . Former Smoker  Smokeless tobacco  . Never Used    Comment: smoked for a few months in his early 20's.    Labs: Recent Review Flowsheet Data    Labs for ITP Cardiac and Pulmonary Rehab Latest Ref Rng 01/03/2014 01/03/2014 01/03/2014 01/03/2014 01/04/2014   PHART 7.350 - 7.450 7.313(L) 7.318(L) 7.344(L) - -   PCO2ART 35.0 - 45.0 mmHg 53.3(H) 61.2(HH) 52.4(H) - -   HCO3 20.0 - 24.0 mEq/L 27.0(H) 31.3(H) 28.5(H) - -   TCO2 0 - 100 mmol/L 29 33 30 30 26    O2SAT - 92.0 94.0 95.0 - -       Exercise Target Goals:    Exercise Program Goal: Individual exercise prescription set with THRR, safety & activity barriers. Participant demonstrates ability to understand and report RPE using BORG scale, to self-measure pulse accurately, and to acknowledge the importance of the exercise prescription.  Exercise Prescription Goal: Starting with aerobic activity 30 plus minutes a day, 3 days per week for initial exercise prescription. Provide home exercise prescription and guidelines that participant acknowledges understanding prior to discharge.  Activity Barriers & Risk Stratification:   6 Minute Walk:   Initial Exercise Prescription:   Exercise Prescription Changes:     Exercise Prescription  Changes      09/08/14 0600 10/27/14 0600         Exercise Review   Progression Yes No  Absent since last review, last visit 09/15/14       Response to Exercise   Blood Pressure (Admit) 140/90 mmHg 122/70 mmHg      Blood Pressure (Exercise) 182/82 mmHg 154/72 mmHg      Blood Pressure (Exit) 132/70 mmHg 118/68 mmHg      Heart Rate (Admit) 73 bpm 63 bpm      Heart Rate (Exercise) 98 bpm 116 bpm      Heart Rate (Exit) 88 bpm       Rating of Perceived Exertion (Exercise) 13 13      Frequency --       Duration Progress to 50 minutes of aerobic without signs/symptoms of physical distress Progress to 50 minutes of aerobic without signs/symptoms of physical distress       Intensity THRR unchanged THRR unchanged      Progression Continue progressive overload as per policy without signs/symptoms or physical distress. Continue progressive overload as per policy without signs/symptoms or physical distress.      Resistance Training   Training Prescription Yes Yes      Weight 8 8      Reps 10-12 10-12      Interval Training   Interval Training Yes Yes      Equipment Treadmill;Recumbant Elliptical Treadmill;Recumbant Elliptical      Comments TM 3.5-3.8/0%; REL L4.5/45W-160W TM 3.5-3.8/0%; REL L4.5/45W-160W      Treadmill   MPH 3.8 3.8      Grade 0 0      Minutes 30 30      Recumbant Elliptical   Level 4.5 4.5      RPM 45 45      Watts 45 45      Minutes 30 30         Discharge Exercise Prescription (Final Exercise Prescription Changes):     Exercise Prescription Changes - 10/27/14 0600    Exercise Review   Progression No  Absent since last review, last visit 09/15/14    Response to Exercise   Blood Pressure (Admit) 122/70 mmHg   Blood Pressure (Exercise) 154/72 mmHg   Blood Pressure (Exit) 118/68 mmHg   Heart Rate (Admit) 63 bpm   Heart Rate (Exercise) 116 bpm   Rating of Perceived Exertion (Exercise) 13   Duration Progress to 50 minutes of aerobic without signs/symptoms of physical distress   Intensity THRR unchanged   Progression Continue progressive overload as per policy without signs/symptoms or physical distress.   Resistance Training   Training Prescription Yes   Weight 8   Reps 10-12   Interval Training   Interval Training Yes   Equipment Treadmill;Recumbant Elliptical   Comments TM 3.5-3.8/0%; REL L4.5/45W-160W   Treadmill   MPH 3.8   Grade 0   Minutes 30   Recumbant Elliptical   Level 4.5   RPM 45   Watts 45   Minutes 30      Nutrition:  Target Goals: Understanding of nutrition guidelines, daily intake of sodium 1500mg , cholesterol 200mg , calories 30% from fat and 7% or less from saturated fats, daily to have 5 or  more servings of fruits and vegetables.  Biometrics:    Nutrition Therapy Plan and Nutrition Goals:   Nutrition Discharge: Rate Your Plate Scores:     Rate Your Plate - 16/10/96 1219    Rate Your  Plate Scores   Post Score 75   Post Score % 83 %      Nutrition Goals Re-Evaluation:     Nutrition Goals Re-Evaluation      08/27/14 1437 10/06/14 1335         Personal Goal #1 Re-Evaluation   Personal Goal #1 Is eating 3 Grams of Protein at each meal. He is also eating vegetables and more fruti. Has lost 8 lbs! Is eating 3 Grams of Protein at each meal. He is also eating vegetables and more fruti. Has lost 8 lbs!      Goal Progress Seen Yes Yes         Psychosocial: Target Goals: Acknowledge presence or absence of depression, maximize coping skills, provide positive support system. Participant is able to verbalize types and ability to use techniques and skills needed for reducing stress and depression.  Initial Review & Psychosocial Screening:   Quality of Life Scores:     Quality of Life - 09/10/14 1220    Quality of Life Scores   Health/Function Post 27.6 %   Socioeconomic Post 27.81 %   Psych/Spiritual Post 26.79 %   Family Post 26.6 %   GLOBAL Post 27.34 %      PHQ-9:     Recent Review Flowsheet Data    Depression screen [facility] 2/9 09/10/2014   Decreased Interest 0   Down, Depressed, Hopeless 0   PHQ - 2 Score 0   Altered sleeping 0   Tired, decreased energy 0   Change in appetite 0   Feeling bad or failure about yourself  0   Trouble concentrating 0   Moving slowly or fidgety/restless 0   Suicidal thoughts 0   PHQ-9 Score 0      Psychosocial Evaluation and Intervention:   Psychosocial Re-Evaluation:   Vocational Rehabilitation: Provide vocational rehab assistance to qualifying candidates.   Vocational Rehab Evaluation & Intervention:   Education: Education Goals: Education classes will be provided on a weekly basis, covering required  topics. Participant will state understanding/return demonstration of topics presented.  Learning Barriers/Preferences:   Education Topics: General Nutrition Guidelines/Fats and Fiber: -Group instruction provided by verbal, written material, models and posters to present the general guidelines for heart healthy nutrition. Gives an explanation and review of dietary fats and fiber.   Controlling Sodium/Reading Food Labels: -Group verbal and written material supporting the discussion of sodium use in heart healthy nutrition. Review and explanation with models, verbal and written materials for utilization of the food label.   Exercise Physiology & Risk Factors: - Group verbal and written instruction with models to review the exercise physiology of the cardiovascular system and associated critical values. Details cardiovascular disease risk factors and the goals associated with each risk factor.   Aerobic Exercise & Resistance Training: - Gives group verbal and written discussion on the health impact of inactivity. On the components of aerobic and resistive training programs and the benefits of this training and how to safely progress through these programs.   Flexibility, Balance, General Exercise Guidelines: - Provides group verbal and written instruction on the benefits of flexibility and balance training programs. Provides general exercise guidelines with specific guidelines to those with heart or lung disease. Demonstration and skill practice provided.   Stress Management: - Provides group verbal and written instruction about the health risks of elevated stress, cause of high stress, and healthy ways to reduce stress.   Depression: - Provides group verbal and written instruction on the correlation between  heart/lung disease and depressed mood, treatment options, and the stigmas associated with seeking treatment.   Anatomy & Physiology of the Heart: - Group verbal and written  instruction and models provide basic cardiac anatomy and physiology, with the coronary electrical and arterial systems. Review of: AMI, Angina, Valve disease, Heart Failure, Cardiac Arrhythmia, Pacemakers, and the ICD.   Cardiac Procedures: - Group verbal and written instruction and models to describe the testing methods done to diagnose heart disease. Reviews the outcomes of the test results. Describes the treatment choices: Medical Management, Angioplasty, or Coronary Bypass Surgery.   Cardiac Medications: - Group verbal and written instruction to review commonly prescribed medications for heart disease. Reviews the medication, class of the drug, and side effects. Includes the steps to properly store meds and maintain the prescription regimen.   Go Sex-Intimacy & Heart Disease, Get SMART - Goal Setting: - Group verbal and written instruction through game format to discuss heart disease and the return to sexual intimacy. Provides group verbal and written material to discuss and apply goal setting through the application of the S.M.A.R.T. Method.   Other Matters of the Heart: - Provides group verbal, written materials and models to describe Heart Failure, Angina, Valve Disease, and Diabetes in the realm of heart disease. Includes description of the disease process and treatment options available to the cardiac patient.   Exercise & Equipment Safety: - Individual verbal instruction and demonstration of equipment use and safety with use of the equipment.   Infection Prevention: - Provides verbal and written material to individual with discussion of infection control including proper hand washing and proper equipment cleaning during exercise session.   Falls Prevention: - Provides verbal and written material to individual with discussion of falls prevention and safety.   Diabetes: - Individual verbal and written instruction to review signs/symptoms of diabetes, desired ranges of glucose  level fasting, after meals and with exercise. Advice that pre and post exercise glucose checks will be done for 3 sessions at entry of program.    Knowledge Questionnaire Score:     Knowledge Questionnaire Score - 09/10/14 1217    Knowledge Questionnaire Score   Post Score 28      Personal Goals and Risk Factors at Admission:   Personal Goals and Risk Factors Review:      Goals and Risk Factor Review      08/27/14 1438 10/06/14 1335         Weight Management   Goals Progress/Improvement seen Yes Yes      Comments Is eating 3 Grams of Protein at each meal. He is also eating vegetables and more fruti. Has lost 8 lbs! Continues to lose weight with nutrition plan he is following      Increase Aerobic Exercise and Physical Activity   Goals Progress/Improvement seen  Yes Yes      Comments  Does not attend class on a regular basis, is not receiving full benifit of exercise program  Has progressed when in program      Take Less Medication   Goals Progress/Improvement seen Yes No      Understand more about Heart/Pulmonary Disease   Goals Progress/Improvement seen  Yes Yes      Comments  Does atten classes when here and does ask question as needed      Diabetes   Goal  Blood glucose control identified by blood glucose values, HgbA1C. Participant verbalizes understanding of the signs/symptoms of hyper/hypo glycemia, proper foot care and importance of medication and  nutrition plan for blood glucose control.      Progress seen towards goals  Yes      Comments  Blood sugar usually in normal range when checked in program      Hypertension   Goal  Participant will see blood pressure controlled within the values of 140/53mm/Hg or within value directed by their physician.      Progress seen toward goals  Yes      Comments  BP normally in good controlled range      Abnormal Lipids   Goal  Cholesterol controlled with medications as prescribed, with individualized exercise RX and with  personalized nutrition plan. Value goals: LDL < , HDL > . Participant states understanding of desired cholesterol values and following prescriptions.      Progress seen towards goals  Unknown      Comments  no labs to review         Personal Goals Discharge:     Comments: 30 day review. Has had to be out due to work at times. Hope to return to graduate and complete his 36th session.

## 2014-11-12 ENCOUNTER — Telehealth: Payer: Self-pay | Admitting: *Deleted

## 2014-11-12 NOTE — Telephone Encounter (Signed)
Left a vm to see if he is ok and returning to Cardiac Rehab since we thought he said he was going to attend one last time and wants to be discharged.

## 2014-11-20 ENCOUNTER — Encounter: Payer: Self-pay | Admitting: *Deleted

## 2014-11-20 ENCOUNTER — Telehealth: Payer: Self-pay | Admitting: *Deleted

## 2014-11-20 DIAGNOSIS — Z951 Presence of aortocoronary bypass graft: Secondary | ICD-10-CM

## 2014-11-20 NOTE — Telephone Encounter (Signed)
Jake Castro called to asked to be discharged from Cardiac Rehab. He completed 27/36 sessions.

## 2014-11-20 NOTE — Patient Instructions (Signed)
Discharge Instructions  Patient Details  Name: Jake Castro MRN: [id number] Date of Birth: December 18, 1950 Referring Provider:  No ref. provider found   Number of Visits: 27  Reason for Discharge:  Early Exit:  Back to work  Smoking History:  History  Smoking status  . Former Smoker  Smokeless tobacco  . Never Used    Comment: smoked for a few months in his early 20's.    Diagnosis:  S/P CABG (coronary artery bypass graft) - Plan: CARDIAC REHAB 30 DAY REVIEW  Initial Exercise Prescription:   Discharge Exercise Prescription (Final Exercise Prescription Changes):     Exercise Prescription Changes - 10/27/14 0600    Exercise Review   Progression No  Absent since last review, last visit 09/15/14    Response to Exercise   Blood Pressure (Admit) 122/70 mmHg   Blood Pressure (Exercise) 154/72 mmHg   Blood Pressure (Exit) 118/68 mmHg   Heart Rate (Admit) 63 bpm   Heart Rate (Exercise) 116 bpm   Rating of Perceived Exertion (Exercise) 13   Duration Progress to 50 minutes of aerobic without signs/symptoms of physical distress   Intensity THRR unchanged   Progression Continue progressive overload as per policy without signs/symptoms or physical distress.   Resistance Training   Training Prescription Yes   Weight 8   Reps 10-12   Interval Training   Interval Training Yes   Equipment Treadmill;Recumbant Elliptical   Comments TM 3.5-3.8/0%; REL L4.5/45W-160W   Treadmill   MPH 3.8   Grade 0   Minutes 30   Recumbant Elliptical   Level 4.5   RPM 45   Watts 45   Minutes 30      Functional Capacity:   Quality of Life:     Quality of Life - 11/20/14 1112    Quality of Life Scores   Health/Function Post --  Not done did 27/36 sessions.       Personal Goals: Goals established at orientation with interventions provided to work toward goal.    Personal Goals Discharge:     Personal Goals at Discharge - 11/20/14 1111    Weight Management   Goals  Progress/Improvement seen Yes   Comments Weight was 247 Mar 24, 2014 and down to 238lbs on September 15, 2014   Increase Aerobic Exercise and Physical Activity   Goals Progress/Improvement seen  Yes   Take Less Medication   Goals Progress/Improvement seen No   Understand more about Heart/Pulmonary Disease   Goals Progress/Improvement seen  Yes      Nutrition & Weight - Outcomes:    Nutrition:   Nutrition Discharge:     Rate Your Plate - 40/98/11 1111    Rate Your Plate Scores   Post Score --  Not done did 27/36 sessions.       Education Questionnaire Score:     Knowledge Questionnaire Score - 09/10/14 1217    Knowledge Questionnaire Score   Post Score 28      Goals reviewed with patient; copy given to patient.

## 2014-11-20 NOTE — Progress Notes (Signed)
Discharge Summary  Patient Details  Name: Jake Castro MRN: [id number] Date of Birth: 06/27/1950 Referring Provider:  No ref. provider found   Number of Visits: 27  Reason for Discharge:  Early Exit:  Back to work  Smoking History:  History  Smoking status  . Former Smoker  Smokeless tobacco  . Never Used    Comment: smoked for a few months in his early 20's.    Diagnosis:  S/P CABG (coronary artery bypass graft)  ADL [facility]:   Initial Exercise Prescription:   Discharge Exercise Prescription (Final Exercise Prescription Changes):     Exercise Prescription Changes - 10/27/14 0600    Exercise Review   Progression No  Absent since last review, last visit 09/15/14    Response to Exercise   Blood Pressure (Admit) 122/70 mmHg   Blood Pressure (Exercise) 154/72 mmHg   Blood Pressure (Exit) 118/68 mmHg   Heart Rate (Admit) 63 bpm   Heart Rate (Exercise) 116 bpm   Rating of Perceived Exertion (Exercise) 13   Duration Progress to 50 minutes of aerobic without signs/symptoms of physical distress   Intensity THRR unchanged   Progression Continue progressive overload as per policy without signs/symptoms or physical distress.   Resistance Training   Training Prescription Yes   Weight 8   Reps 10-12   Interval Training   Interval Training Yes   Equipment Treadmill;Recumbant Elliptical   Comments TM 3.5-3.8/0%; REL L4.5/45W-160W   Treadmill   MPH 3.8   Grade 0   Minutes 30   Recumbant Elliptical   Level 4.5   RPM 45   Watts 45   Minutes 30      Functional Capacity:   Psychological, QOL, Others - Outcomes: PHQ 2/9: Depression screen PHQ 2/9 09/10/2014  Decreased Interest 0  Down, Depressed, Hopeless 0  PHQ - 2 Score 0  Altered sleeping 0  Tired, decreased energy 0  Change in appetite 0  Feeling bad or failure about yourself  0  Trouble concentrating 0  Moving slowly or fidgety/restless 0  Suicidal thoughts 0  PHQ-9 Score 0    Quality of Life:      Quality of Life - 11/20/14 1112    Quality of Life Scores   Health/Function Post --  Not done did 27/36 sessions.       Personal Goals: Goals established at orientation with interventions provided to work toward goal.    Personal Goals Discharge:     Personal Goals at Discharge - 11/20/14 1111    Weight Management   Goals Progress/Improvement seen Yes   Comments Weight was 247 Mar 24, 2014 and down to 238lbs on September 15, 2014   Increase Aerobic Exercise and Physical Activity   Goals Progress/Improvement seen  Yes   Take Less Medication   Goals Progress/Improvement seen No   Understand more about Heart/Pulmonary Disease   Goals Progress/Improvement seen  Yes      Nutrition & Weight - Outcomes:    Nutrition:   Nutrition Discharge:     Rate Your Plate - 40/98/11 1111    Rate Your Plate Scores   Post Score --  Not done did 27/36 sessions.       Education Questionnaire Score:     Knowledge Questionnaire Score - 09/10/14 1217    Knowledge Questionnaire Score   Post Score 28      Goals reviewed with patient; copy given to patient.

## 2014-11-20 NOTE — Progress Notes (Signed)
Cardiac Individual Treatment Plan  Patient Details  Name: Jake Castro MRN: [id number] Date of Birth: March 05, 1951 Referring Provider:  No ref. provider found  Initial Encounter Date:    Visit Diagnosis: S/P CABG (coronary artery bypass graft)  Patient's Home Medications on Admission:  Current outpatient prescriptions:  .  amiodarone (PACERONE) 200 MG tablet, Take 1 tablet (200 mg total) by mouth daily., Disp: 30 tablet, Rfl: 1 .  Ascorbic Acid (VITAMIN C) 1000 MG tablet, Take 1,000 mg by mouth daily., Disp: , Rfl:  .  aspirin EC 325 MG tablet, Take 1 tablet (325 mg total) by mouth every evening., Disp: , Rfl:  .  atorvastatin (LIPITOR) 20 MG tablet, Take 1 tablet (20 mg total) by mouth daily at 6 PM., Disp: 30 tablet, Rfl: 1 .  carvedilol (COREG) 12.5 MG tablet, Take 12.5 mg by mouth 2 (two) times daily with a meal., Disp: , Rfl:  .  Cholecalciferol (VITAMIN D-3) 5000 UNITS TABS, Take 5,000 mg by mouth daily., Disp: , Rfl:  .  ferrous fumarate-b12-vitamic C-folic acid (TRINSICON / FOLTRIN) capsule, Take 1 capsule by mouth daily. For one month then stop., Disp: 30 capsule, Rfl: 0 .  KRILL OIL PO, Take 1 tablet by mouth daily., Disp: , Rfl:  .  Liraglutide (VICTOZA) 18 MG/3ML SOPN, Inject 1.8 mg into the skin daily., Disp: , Rfl:  .  metFORMIN (GLUCOPHAGE) 1000 MG tablet, Take 1,000 mg by mouth 2 (two) times daily with a meal., Disp: , Rfl:  .  VITAMIN K PO, Take 1 tablet by mouth daily., Disp: , Rfl:   Past Medical History: Past Medical History  Diagnosis Date  . CAD (coronary artery disease)     a. 12/2006 NSTEMI/PCI: RCA 50m/d->mid vessel stented, unable to cross dist dzs w/ balloon;  b. 11/2013 NL MV;  c. 11/2013 Echo: EF 55%;  d. 12/2013 Cath: LM 95 ost, LAD 70p, 26m, LCX 30p/m, OM1 60, RCA 20p, 80m/74m, 95d.  Marland Kitchen HTN (hypertension)   . Hyperlipidemia   . Diabetes mellitus   . Carotid arterial disease     a. s/p LCEA  . Morbid obesity   . Myocardial infarction   . Pneumonia      Tobacco Use: History  Smoking status  . Former Smoker  Smokeless tobacco  . Never Used    Comment: smoked for a few months in his early 20's.    Labs: Recent Review Flowsheet Data    Labs for ITP Cardiac and Pulmonary Rehab Latest Ref Rng 01/03/2014 01/03/2014 01/03/2014 01/03/2014 01/04/2014   PHART 7.350 - 7.450 7.313(L) 7.318(L) 7.344(L) - -   PCO2ART 35.0 - 45.0 mmHg 53.3(H) 61.2(HH) 52.4(H) - -   HCO3 20.0 - 24.0 mEq/L 27.0(H) 31.3(H) 28.5(H) - -   TCO2 0 - 100 mmol/L 29 33 30 30 26    O2SAT - 92.0 94.0 95.0 - -       Exercise Target Goals:    Exercise Program Goal: Individual exercise prescription set with THRR, safety & activity barriers. Participant demonstrates ability to understand and report RPE using BORG scale, to self-measure pulse accurately, and to acknowledge the importance of the exercise prescription.  Exercise Prescription Goal: Starting with aerobic activity 30 plus minutes a day, 3 days per week for initial exercise prescription. Provide home exercise prescription and guidelines that participant acknowledges understanding prior to discharge.  Activity Barriers & Risk Stratification:   6 Minute Walk:   Initial Exercise Prescription:   Exercise Prescription Changes:  Exercise Prescription Changes      09/08/14 0600 10/27/14 0600         Exercise Review   Progression Yes No  Absent since last review, last visit 09/15/14       Response to Exercise   Blood Pressure (Admit) 140/90 mmHg 122/70 mmHg      Blood Pressure (Exercise) 182/82 mmHg 154/72 mmHg      Blood Pressure (Exit) 132/70 mmHg 118/68 mmHg      Heart Rate (Admit) 73 bpm 63 bpm      Heart Rate (Exercise) 98 bpm 116 bpm      Heart Rate (Exit) 88 bpm       Rating of Perceived Exertion (Exercise) 13 13      Frequency --       Duration Progress to 50 minutes of aerobic without signs/symptoms of physical distress Progress to 50 minutes of aerobic without signs/symptoms of  physical distress      Intensity THRR unchanged THRR unchanged      Progression Continue progressive overload as per policy without signs/symptoms or physical distress. Continue progressive overload as per policy without signs/symptoms or physical distress.      Resistance Training   Training Prescription Yes Yes      Weight 8 8      Reps 10-12 10-12      Interval Training   Interval Training Yes Yes      Equipment Treadmill;Recumbant Elliptical Treadmill;Recumbant Elliptical      Comments TM 3.5-3.8/0%; REL L4.5/45W-160W TM 3.5-3.8/0%; REL L4.5/45W-160W      Treadmill   MPH 3.8 3.8      Grade 0 0      Minutes 30 30      Recumbant Elliptical   Level 4.5 4.5      RPM 45 45      Watts 45 45      Minutes 30 30         Discharge Exercise Prescription (Final Exercise Prescription Changes):     Exercise Prescription Changes - 10/27/14 0600    Exercise Review   Progression No  Absent since last review, last visit 09/15/14    Response to Exercise   Blood Pressure (Admit) 122/70 mmHg   Blood Pressure (Exercise) 154/72 mmHg   Blood Pressure (Exit) 118/68 mmHg   Heart Rate (Admit) 63 bpm   Heart Rate (Exercise) 116 bpm   Rating of Perceived Exertion (Exercise) 13   Duration Progress to 50 minutes of aerobic without signs/symptoms of physical distress   Intensity THRR unchanged   Progression Continue progressive overload as per policy without signs/symptoms or physical distress.   Resistance Training   Training Prescription Yes   Weight 8   Reps 10-12   Interval Training   Interval Training Yes   Equipment Treadmill;Recumbant Elliptical   Comments TM 3.5-3.8/0%; REL L4.5/45W-160W   Treadmill   MPH 3.8   Grade 0   Minutes 30   Recumbant Elliptical   Level 4.5   RPM 45   Watts 45   Minutes 30      Nutrition:  Target Goals: Understanding of nutrition guidelines, daily intake of sodium 1500mg , cholesterol 200mg , calories 30% from fat and 7% or less from saturated fats,  daily to have 5 or more servings of fruits and vegetables.  Biometrics:    Nutrition Therapy Plan and Nutrition Goals:   Nutrition Discharge: Rate Your Plate Scores:     Rate Your Plate - 16/10/96 1111  Rate Your Plate Scores   Post Score --  Not done did 27/36 sessions.       Nutrition Goals Re-Evaluation:     Nutrition Goals Re-Evaluation      08/27/14 1437 10/06/14 1335         Personal Goal #1 Re-Evaluation   Personal Goal #1 Is eating 3 Grams of Protein at each meal. He is also eating vegetables and more fruti. Has lost 8 lbs! Is eating 3 Grams of Protein at each meal. He is also eating vegetables and more fruti. Has lost 8 lbs!      Goal Progress Seen Yes Yes         Psychosocial: Target Goals: Acknowledge presence or absence of depression, maximize coping skills, provide positive support system. Participant is able to verbalize types and ability to use techniques and skills needed for reducing stress and depression.  Initial Review & Psychosocial Screening:   Quality of Life Scores:     Quality of Life - 11/20/14 1112    Quality of Life Scores   Health/Function Post --  Not done did 27/36 sessions.       PHQ-9:     Recent Review Flowsheet Data    Depression screen [facility] 2/9 09/10/2014   Decreased Interest 0   Down, Depressed, Hopeless 0   PHQ - 2 Score 0   Altered sleeping 0   Tired, decreased energy 0   Change in appetite 0   Feeling bad or failure about yourself  0   Trouble concentrating 0   Moving slowly or fidgety/restless 0   Suicidal thoughts 0   PHQ-9 Score 0      Psychosocial Evaluation and Intervention:   Psychosocial Re-Evaluation:   Vocational Rehabilitation: Provide vocational rehab assistance to qualifying candidates.   Vocational Rehab Evaluation & Intervention:   Education: Education Goals: Education classes will be provided on a weekly basis, covering required topics. Participant will state understanding/return  demonstration of topics presented.  Learning Barriers/Preferences:   Education Topics: General Nutrition Guidelines/Fats and Fiber: -Group instruction provided by verbal, written material, models and posters to present the general guidelines for heart healthy nutrition. Gives an explanation and review of dietary fats and fiber.   Controlling Sodium/Reading Food Labels: -Group verbal and written material supporting the discussion of sodium use in heart healthy nutrition. Review and explanation with models, verbal and written materials for utilization of the food label.   Exercise Physiology & Risk Factors: - Group verbal and written instruction with models to review the exercise physiology of the cardiovascular system and associated critical values. Details cardiovascular disease risk factors and the goals associated with each risk factor.   Aerobic Exercise & Resistance Training: - Gives group verbal and written discussion on the health impact of inactivity. On the components of aerobic and resistive training programs and the benefits of this training and how to safely progress through these programs.   Flexibility, Balance, General Exercise Guidelines: - Provides group verbal and written instruction on the benefits of flexibility and balance training programs. Provides general exercise guidelines with specific guidelines to those with heart or lung disease. Demonstration and skill practice provided.   Stress Management: - Provides group verbal and written instruction about the health risks of elevated stress, cause of high stress, and healthy ways to reduce stress.   Depression: - Provides group verbal and written instruction on the correlation between heart/lung disease and depressed mood, treatment options, and the stigmas associated with seeking treatment.  Anatomy & Physiology of the Heart: - Group verbal and written instruction and models provide basic cardiac anatomy and  physiology, with the coronary electrical and arterial systems. Review of: AMI, Angina, Valve disease, Heart Failure, Cardiac Arrhythmia, Pacemakers, and the ICD.   Cardiac Procedures: - Group verbal and written instruction and models to describe the testing methods done to diagnose heart disease. Reviews the outcomes of the test results. Describes the treatment choices: Medical Management, Angioplasty, or Coronary Bypass Surgery.   Cardiac Medications: - Group verbal and written instruction to review commonly prescribed medications for heart disease. Reviews the medication, class of the drug, and side effects. Includes the steps to properly store meds and maintain the prescription regimen.   Go Sex-Intimacy & Heart Disease, Get SMART - Goal Setting: - Group verbal and written instruction through game format to discuss heart disease and the return to sexual intimacy. Provides group verbal and written material to discuss and apply goal setting through the application of the S.M.A.R.T. Method.   Other Matters of the Heart: - Provides group verbal, written materials and models to describe Heart Failure, Angina, Valve Disease, and Diabetes in the realm of heart disease. Includes description of the disease process and treatment options available to the cardiac patient.   Exercise & Equipment Safety: - Individual verbal instruction and demonstration of equipment use and safety with use of the equipment.   Infection Prevention: - Provides verbal and written material to individual with discussion of infection control including proper hand washing and proper equipment cleaning during exercise session.   Falls Prevention: - Provides verbal and written material to individual with discussion of falls prevention and safety.   Diabetes: - Individual verbal and written instruction to review signs/symptoms of diabetes, desired ranges of glucose level fasting, after meals and with exercise. Advice that  pre and post exercise glucose checks will be done for 3 sessions at entry of program.    Knowledge Questionnaire Score:     Knowledge Questionnaire Score - 09/10/14 1217    Knowledge Questionnaire Score   Post Score 28      Personal Goals and Risk Factors at Admission:   Personal Goals and Risk Factors Review:      Goals and Risk Factor Review      08/27/14 1438 10/06/14 1335         Weight Management   Goals Progress/Improvement seen Yes Yes      Comments Is eating 3 Grams of Protein at each meal. He is also eating vegetables and more fruti. Has lost 8 lbs! Continues to lose weight with nutrition plan he is following      Increase Aerobic Exercise and Physical Activity   Goals Progress/Improvement seen  Yes Yes      Comments  Does not attend class on a regular basis, is not receiving full benifit of exercise program  Has progressed when in program      Take Less Medication   Goals Progress/Improvement seen Yes No      Understand more about Heart/Pulmonary Disease   Goals Progress/Improvement seen  Yes Yes      Comments  Does atten classes when here and does ask question as needed      Diabetes   Goal  Blood glucose control identified by blood glucose values, HgbA1C. Participant verbalizes understanding of the signs/symptoms of hyper/hypo glycemia, proper foot care and importance of medication and nutrition plan for blood glucose control.      Progress seen towards goals  Yes      Comments  Blood sugar usually in normal range when checked in program      Hypertension   Goal  Participant will see blood pressure controlled within the values of 140/81mm/Hg or within value directed by their physician.      Progress seen toward goals  Yes      Comments  BP normally in good controlled range      Abnormal Lipids   Goal  Cholesterol controlled with medications as prescribed, with individualized exercise RX and with personalized nutrition plan. Value goals: LDL < 70mg , HDL > 40mg .  Participant states understanding of desired cholesterol values and following prescriptions.      Progress seen towards goals  Unknown      Comments  no labs to review         Personal Goals Discharge:      Personal Goals at Discharge - 11/20/14 1111    Weight Management   Goals Progress/Improvement seen Yes   Comments Weight was 247 Mar 24, 2014 and down to 238lbs on September 15, 2014   Increase Aerobic Exercise and Physical Activity   Goals Progress/Improvement seen  Yes   Take Less Medication   Goals Progress/Improvement seen No   Understand more about Heart/Pulmonary Disease   Goals Progress/Improvement seen  Yes       Comments: Did 27 out of 36 sessions.

## 2015-05-02 ENCOUNTER — Emergency Department
Admission: EM | Admit: 2015-05-02 | Discharge: 2015-05-02 | Disposition: A | Discharge status: Home / Self Care | Payer: Medicare Other | Attending: Emergency Medicine | Admitting: Emergency Medicine

## 2015-05-02 DIAGNOSIS — Z794 Long term (current) use of insulin: Secondary | ICD-10-CM | POA: Diagnosis not present

## 2015-05-02 DIAGNOSIS — E119 Type 2 diabetes mellitus without complications: Secondary | ICD-10-CM | POA: Diagnosis not present

## 2015-05-02 DIAGNOSIS — Y9289 Other specified places as the place of occurrence of the external cause: Secondary | ICD-10-CM | POA: Insufficient documentation

## 2015-05-02 DIAGNOSIS — Z87891 Personal history of nicotine dependence: Secondary | ICD-10-CM | POA: Diagnosis not present

## 2015-05-02 DIAGNOSIS — S0501XA Injury of conjunctiva and corneal abrasion without foreign body, right eye, initial encounter: Secondary | ICD-10-CM

## 2015-05-02 DIAGNOSIS — Z7982 Long term (current) use of aspirin: Secondary | ICD-10-CM | POA: Diagnosis not present

## 2015-05-02 DIAGNOSIS — Y9389 Activity, other specified: Secondary | ICD-10-CM | POA: Insufficient documentation

## 2015-05-02 DIAGNOSIS — Z79899 Other long term (current) drug therapy: Secondary | ICD-10-CM | POA: Diagnosis not present

## 2015-05-02 DIAGNOSIS — W2209XA Striking against other stationary object, initial encounter: Secondary | ICD-10-CM | POA: Diagnosis not present

## 2015-05-02 DIAGNOSIS — Y998 Other external cause status: Secondary | ICD-10-CM | POA: Diagnosis not present

## 2015-05-02 DIAGNOSIS — I10 Essential (primary) hypertension: Secondary | ICD-10-CM | POA: Diagnosis not present

## 2015-05-02 DIAGNOSIS — Z7984 Long term (current) use of oral hypoglycemic drugs: Secondary | ICD-10-CM | POA: Insufficient documentation

## 2015-05-02 DIAGNOSIS — S0591XA Unspecified injury of right eye and orbit, initial encounter: Secondary | ICD-10-CM | POA: Diagnosis present

## 2015-05-02 MED ORDER — ERYTHROMYCIN 5 MG/GM OP OINT: 1.0000 "application " | TOPICAL_OINTMENT | Freq: Every day | OPHTHALMIC | Status: AC

## 2015-05-02 MED ORDER — FLUORESCEIN SODIUM 1 MG OP STRP
1.0000 | ORAL_STRIP | Freq: Once | OPHTHALMIC | Status: AC
  Administered 2015-05-02: 1 via OPHTHALMIC
  Filled 2015-05-02: qty 1

## 2015-05-02 MED ORDER — TETRACAINE HCL 0.5 % OP SOLN
2.0000 [drp] | Freq: Once | OPHTHALMIC | Status: AC
  Administered 2015-05-02: 2 [drp] via OPHTHALMIC
  Filled 2015-05-02: qty 2

## 2015-05-02 MED ORDER — KETOROLAC TROMETHAMINE 0.5 % OP SOLN: 1.0000 [drp] | Freq: Four times a day (QID) | OPHTHALMIC | Status: DC

## 2015-05-02 NOTE — Discharge Instructions (Signed)
Corneal Abrasion The cornea is the clear covering at the front and center of the eye. When looking at the colored portion of the eye (iris), you are looking through the cornea. This very thin tissue is made up of many layers. The surface layer is a single layer of cells (corneal epithelium) and is one of the most sensitive tissues in the body. If a scratch or injury causes the corneal epithelium to come off, it is called a corneal abrasion. If the injury extends to the tissues below the epithelium, the condition is called a corneal ulcer. CAUSES   Scratches.  Trauma.  Foreign body in the eye. Some people have recurrences of abrasions in the area of the original injury even after it has healed (recurrent erosion syndrome). Recurrent erosion syndrome generally improves and goes away with time. SYMPTOMS   Eye pain.  Difficulty or inability to keep the injured eye open.  The eye becomes very sensitive to light.  Recurrent erosions tend to happen suddenly, first thing in the morning, usually after waking up and opening the eye. DIAGNOSIS  Your health care provider can diagnose a corneal abrasion during an eye exam. Dye is usually placed in the eye using a drop or a small paper strip moistened by your tears. When the eye is examined with a special light, the abrasion shows up clearly because of the dye. TREATMENT   Small abrasions may be treated with antibiotic drops or ointment alone.  A pressure patch may be put over the eye. If this is done, follow your doctor's instructions for when to remove the patch. Do not drive or use machines while the eye patch is on. Judging distances is hard to do with a patch on. If the abrasion becomes infected and spreads to the deeper tissues of the cornea, a corneal ulcer can result. This is serious because it can cause corneal scarring. Corneal scars interfere with light passing through the cornea and cause a loss of vision in the involved eye. HOME CARE  INSTRUCTIONS  Use medicine or ointment as directed. Only take over-the-counter or prescription medicines for pain, discomfort, or fever as directed by your health care provider.  Do not drive or operate machinery if your eye is patched. Your ability to judge distances is impaired.  If your health care provider has given you a follow-up appointment, it is very important to keep that appointment. Not keeping the appointment could result in a severe eye infection or permanent loss of vision. If there is any problem keeping the appointment, let your health care provider know. SEEK MEDICAL CARE IF:   You have pain, light sensitivity, and a scratchy feeling in one eye or both eyes.  Your pressure patch keeps loosening up, and you can blink your eye under the patch after treatment.  Any kind of discharge develops from the eye after treatment or if the lids stick together in the morning.  You have the same symptoms in the morning as you did with the original abrasion days, weeks, or months after the abrasion healed.   This information is not intended to replace advice given to you by your health care provider. Make sure you discuss any questions you have with your health care provider.   Document Released: 03/03/2000 Document Revised: 11/25/2014 Document Reviewed: 11/11/2012 Elsevier Interactive Patient Education 2016 [employer].  Use the eye drops and ointment as directed. Follow-up with [facility] as directed. Rest the eyes today and use sunglasses to protect from  excessive light.

## 2015-05-02 NOTE — ED Provider Notes (Signed)
Jake Castro - [facility] Emergency Department Provider Note ____________________________________________  Time seen: 0711  I have reviewed the triage vital signs and the nursing notes.  HISTORY  Chief Complaint  Eye Pain  HPI Jake Castro is a 65 y.o. male visit the ED for evaluation management of burning and itching to the right eye for the last 18 hours. Patient describes a rough sandpapery sensation to the eye. He has placed a homemade eye patch over the eye for comfort, and describes that this does help his pain. He is noting some itching and irritation to the left eye as well.Denies any injury or direct trauma to the eyes. He denies any known sick contacts at this time. Patient normally wears corrective lenses for bifocal vision, but did not bring them with him at this time. Of note is that he is currently on a course of doxycycline for a recent tick bite and and Lyme disease prophylaxis.  ----------------------------------------- 7:50 AM on 05/02/2015 ----------------------------------------- Patient recalls direct trauma to the right eye after the exam. He describes following the dog into the woods behind the house yesterday morning. He bent over to retreive the dog's toy, when a pine needle accidentally scratched his right eye, under his glasses.   Past Medical History  Diagnosis Date  . CAD (coronary artery disease)     a. 12/2006 NSTEMI/PCI: RCA 1m/d->mid vessel stented, unable to cross dist dzs w/ balloon;  b. 11/2013 NL MV;  c. 11/2013 Echo: EF 55%;  d. 12/2013 Cath: LM 95 ost, LAD 70p, 53m, LCX 30p/m, OM1 60, RCA 20p, 3m/71m, 95d.  Marland Kitchen HTN (hypertension)   . Hyperlipidemia   . Diabetes mellitus   . Carotid arterial disease     a. s/p LCEA  . Morbid obesity   . Myocardial infarction (HCC)   . Pneumonia     Patient Active Problem List   Diagnosis Date Noted  . Cellulitis, wound, post-operative 01/21/2014  . Diabetes mellitus (HCC) 01/16/2014  . S/P CABG x 4  01/03/2014  . Unstable angina (HCC) 01/02/2014    Past Surgical History  Procedure Laterality Date  . Coronary angioplasty    . Coronary artery bypass graft N/A 01/03/2014    Procedure: CORONARY ARTERY BYPASS GRAFTING (CABG) x four, using left internal mammary and right leg greater saphenous vein harvested endoscopically;  Surgeon: Kerin Perna, MD;  Location: [facility] OR;  Service: Open Heart Surgery;  Laterality: N/A;  . Tee without cardioversion N/A 01/03/2014    Procedure: TRANSESOPHAGEAL ECHOCARDIOGRAM (TEE);  Surgeon: Kerin Perna, MD;  Location: [facility] OR;  Service: Open Heart Surgery;  Laterality: N/A;    Current Outpatient Rx  Name  Route  Sig  Dispense  Refill  . amiodarone (PACERONE) 200 MG tablet   Oral   Take 1 tablet (200 mg total) by mouth daily.   30 tablet   1   . Ascorbic Acid (VITAMIN C) 1000 MG tablet   Oral   Take 1,000 mg by mouth daily.         Marland Kitchen aspirin EC 325 MG tablet   Oral   Take 1 tablet (325 mg total) by mouth every evening.         Marland Kitchen atorvastatin (LIPITOR) 20 MG tablet   Oral   Take 1 tablet (20 mg total) by mouth daily at 6 PM.   30 tablet   1   . carvedilol (COREG) 12.5 MG tablet   Oral   Take 12.5 mg by mouth 2 (  two) times daily with a meal.         . Cholecalciferol (VITAMIN D-3) 5000 UNITS TABS   Oral   Take 5,000 mg by mouth daily.         Marland Kitchen erythromycin ophthalmic ointment   Right Eye   Place 1 application into the right eye 6 (six) times daily.   3.5 g   0   . ferrous fumarate-b12-vitamic C-folic acid (TRINSICON / FOLTRIN) capsule   Oral   Take 1 capsule by mouth daily. For one month then stop.   30 capsule   0   . ketorolac (ACULAR) 0.5 % ophthalmic solution   Both Eyes   Place 1 drop into both eyes 4 (four) times daily.   5 mL   0   . KRILL OIL PO   Oral   Take 1 tablet by mouth daily.         . Liraglutide (VICTOZA) 18 MG/3ML SOPN   Subcutaneous   Inject 1.8 mg into the skin daily.         .  metFORMIN (GLUCOPHAGE) 1000 MG tablet   Oral   Take 1,000 mg by mouth 2 (two) times daily with a meal.         . VITAMIN K PO   Oral   Take 1 tablet by mouth daily.          Allergies Kiwi extract  Family History  Problem Relation Age of Onset  . Peripheral vascular disease Father     died in his 3's - had CEA.  . Peripheral vascular disease Brother     s/p CEA  . Stroke Brother     in his 68's  . COPD Mother     died in her 44's.    Social History Social History  Substance Use Topics  . Smoking status: Former Games developer  . Smokeless tobacco: Never Used     Comment: smoked for a few months in his early 20's.  . Alcohol Use: No   Review of Systems  Constitutional: Negative for fever. Eyes: Negative for visual changes. Right eye irritation as above ENT: Negative for sore throat. Skin: Negative for rash. Neurological: Negative for headaches, focal weakness or numbness. ____________________________________________  PHYSICAL EXAM:  VITAL SIGNS: ED Triage Vitals  Enc Vitals Group     BP 05/02/15 0606 139/78 mmHg     Pulse Rate 05/02/15 0606 70     Resp 05/02/15 0606 18     Temp 05/02/15 0606 97.8 F (36.6 C)     Temp Source 05/02/15 0606 Oral     SpO2 05/02/15 0606 97 %     Weight 05/02/15 0606 260 lb (117.935 kg)     Height 05/02/15 0606  (1.778 m)     Head Cir --      Peak Flow --      Pain Score 05/02/15 0608 7     Pain Loc --      Pain Edu? --      Excl. in GC? --    Constitutional: Alert and oriented. Well appearing and in no distress. Head: Normocephalic and atraumatic.      Eyes: Conjunctivae are injected bilaterally. PERRL. Normal extraocular movements. The right eye shows dye uptake at the 5 o'clock position overlying the cornea.       Ears: Canals clear. TMs intact bilaterally.   Nose: No congestion/rhinorrhea.   Mouth/Throat: Mucous membranes are moist.   Neck: Supple. No thyromegaly. Hematological/Lymphatic/Immunological:  No  cervical/preauricular lymphadenopathy. Cardiovascular: Normal rate, regular rhythm.  Respiratory: Normal respiratory effort. No wheezes/rales/rhonchi. Musculoskeletal: Nontender with normal range of motion in all extremities.  Neurologic:  Normal gait without ataxia. Normal speech and language. No gross focal neurologic deficits are appreciated. Skin:  Skin is warm, dry and intact. No rash noted. Psychiatric: Mood and affect are normal. Patient exhibits appropriate insight and judgment. ____________________________________________  PROCEDURES  Tetracaine ii gtts OD ____________________________________________  INITIAL IMPRESSION / ASSESSMENT AND PLAN / ED COURSE  Patient with acute pain to the right eye secondary to a corneal abrasion. Patient is discharged with prescriptions for Acular and erythromycin ointment to dose as directed.  Follow with [facility] for routine follow-up in 24 hours. He is encouraged to rest eyes and the sunglasses for self protection. ____________________________________________  FINAL CLINICAL IMPRESSION(S) / ED DIAGNOSES  Final diagnoses:  Corneal abrasion, right, initial encounter      Lissa Hoard, PA-C 05/02/15 0801  Sharman Cheek, MD 05/03/15 2253

## 2015-05-02 NOTE — ED Notes (Addendum)
Pt c/o burning and itching to right eye for about 18 hours; feels like he has sand in his eye; pt with homemade patch to eye and says this helps it feel better; left eye noted to be pink with mild itching; pt says he is currently taking Doxycycline for tick bite and subsequent fever and body aches; yesterday he had a headache

## 2015-05-02 NOTE — ED Notes (Signed)
Pt did not bring his glasses and cannot have visual acquity checked; pt adds his vision is blurry

## 2015-05-02 NOTE — ED Notes (Signed)
Pt states he was recently in [place] for 20 days and returned on Jan 26.  Pt on Doxycycline for tick bite. Pt states he has had intermittent fevers and muscle aches and pains since the tick bite.

## 2015-11-08 NOTE — Discharge Instructions (Signed)

## 2015-11-10 ENCOUNTER — Encounter
Admission: RE | Disposition: A | Discharge status: Home / Self Care | Payer: Self-pay | Source: Ambulatory Visit | Attending: Ophthalmology

## 2015-11-10 ENCOUNTER — Ambulatory Visit: Payer: Medicare Other | Admitting: Anesthesiology

## 2015-11-10 ENCOUNTER — Ambulatory Visit
Admission: RE | Admit: 2015-11-10 | Discharge: 2015-11-10 | Disposition: A | Discharge status: Home / Self Care | Payer: Medicare Other | Source: Ambulatory Visit | Attending: Ophthalmology | Admitting: Ophthalmology

## 2015-11-10 DIAGNOSIS — M722 Plantar fascial fibromatosis: Secondary | ICD-10-CM | POA: Insufficient documentation

## 2015-11-10 DIAGNOSIS — H2511 Age-related nuclear cataract, right eye: Secondary | ICD-10-CM | POA: Diagnosis present

## 2015-11-10 DIAGNOSIS — I10 Essential (primary) hypertension: Secondary | ICD-10-CM | POA: Insufficient documentation

## 2015-11-10 DIAGNOSIS — Z6835 Body mass index (BMI) 35.0-35.9, adult: Secondary | ICD-10-CM | POA: Insufficient documentation

## 2015-11-10 DIAGNOSIS — E1136 Type 2 diabetes mellitus with diabetic cataract: Secondary | ICD-10-CM | POA: Insufficient documentation

## 2015-11-10 DIAGNOSIS — I739 Peripheral vascular disease, unspecified: Secondary | ICD-10-CM | POA: Diagnosis not present

## 2015-11-10 DIAGNOSIS — Z951 Presence of aortocoronary bypass graft: Secondary | ICD-10-CM | POA: Insufficient documentation

## 2015-11-10 DIAGNOSIS — I252 Old myocardial infarction: Secondary | ICD-10-CM | POA: Insufficient documentation

## 2015-11-10 DIAGNOSIS — E1142 Type 2 diabetes mellitus with diabetic polyneuropathy: Secondary | ICD-10-CM | POA: Insufficient documentation

## 2015-11-10 DIAGNOSIS — I2510 Atherosclerotic heart disease of native coronary artery without angina pectoris: Secondary | ICD-10-CM | POA: Diagnosis not present

## 2015-11-10 DIAGNOSIS — E7800 Pure hypercholesterolemia, unspecified: Secondary | ICD-10-CM | POA: Diagnosis not present

## 2015-11-10 HISTORY — DX: Presence of dental prosthetic device (complete) (partial): Z97.2

## 2015-11-10 HISTORY — PX: CATARACT EXTRACTION W/PHACO: SHX586

## 2015-11-10 HISTORY — DX: Plantar fascial fibromatosis: M72.2

## 2015-11-10 HISTORY — DX: Myoneural disorder, unspecified: G70.9

## 2015-11-10 HISTORY — DX: Essential (primary) hypertension: I10

## 2015-11-10 LAB — GLUCOSE, CAPILLARY
GLUCOSE-CAPILLARY: 101 mg/dL — AB (ref 65–99)
Glucose-Capillary: 121 mg/dL — ABNORMAL HIGH (ref 65–99)

## 2015-11-10 SURGERY — PHACOEMULSIFICATION, CATARACT, WITH IOL INSERTION
Anesthesia: Monitor Anesthesia Care | Site: Eye | Laterality: Right | Wound class: Clean

## 2015-11-10 MED ORDER — NA HYALUR & NA CHOND-NA HYALUR 0.4-0.35 ML IO KIT
PACK | INTRAOCULAR | Status: DC | PRN
  Administered 2015-11-10: 1 mL via INTRAOCULAR

## 2015-11-10 MED ORDER — ACETAMINOPHEN 325 MG PO TABS: 325.0000 mg | ORAL_TABLET | ORAL | Status: DC | PRN

## 2015-11-10 MED ORDER — POVIDONE-IODINE 5 % OP SOLN
1.0000 "application " | OPHTHALMIC | Status: DC | PRN
  Administered 2015-11-10: 1 via OPHTHALMIC

## 2015-11-10 MED ORDER — CEFUROXIME OPHTHALMIC INJECTION 1 MG/0.1 ML
INJECTION | OPHTHALMIC | Status: DC | PRN
  Administered 2015-11-10: 0.1 mL via OPHTHALMIC

## 2015-11-10 MED ORDER — MIDAZOLAM HCL 2 MG/2ML IJ SOLN
INTRAMUSCULAR | Status: DC | PRN
  Administered 2015-11-10 (×2): 0.5 mg via INTRAVENOUS
  Administered 2015-11-10: 2 mg via INTRAVENOUS

## 2015-11-10 MED ORDER — ACETAMINOPHEN 160 MG/5ML PO SOLN: 325.0000 mg | ORAL | Status: DC | PRN

## 2015-11-10 MED ORDER — LACTATED RINGERS IV SOLN: INTRAVENOUS | Status: DC

## 2015-11-10 MED ORDER — FENTANYL CITRATE (PF) 100 MCG/2ML IJ SOLN
INTRAMUSCULAR | Status: DC | PRN
  Administered 2015-11-10: 100 ug via INTRAVENOUS

## 2015-11-10 MED ORDER — BRIMONIDINE TARTRATE 0.2 % OP SOLN
OPHTHALMIC | Status: DC | PRN
  Administered 2015-11-10: 1 [drp] via OPHTHALMIC

## 2015-11-10 MED ORDER — TETRACAINE HCL 0.5 % OP SOLN
1.0000 [drp] | OPHTHALMIC | Status: DC | PRN
  Administered 2015-11-10: 1 [drp] via OPHTHALMIC

## 2015-11-10 MED ORDER — EPINEPHRINE HCL 1 MG/ML IJ SOLN
INTRAOCULAR | Status: DC | PRN
  Administered 2015-11-10: 70 mL via OPHTHALMIC

## 2015-11-10 MED ORDER — TIMOLOL MALEATE 0.5 % OP SOLN
OPHTHALMIC | Status: DC | PRN
  Administered 2015-11-10: 1 [drp] via OPHTHALMIC

## 2015-11-10 MED ORDER — ARMC OPHTHALMIC DILATING GEL
1.0000 "application " | OPHTHALMIC | Status: DC | PRN
  Administered 2015-11-10 (×2): 1 via OPHTHALMIC

## 2015-11-10 MED ORDER — LIDOCAINE HCL (PF) 4 % IJ SOLN
INTRAOCULAR | Status: DC | PRN
  Administered 2015-11-10: 1 mL via OPHTHALMIC

## 2015-11-10 SURGICAL SUPPLY — 25 items
CANNULA ANT/CHMB 27GA (MISCELLANEOUS) ×2 IMPLANT
CARTRIDGE ABBOTT (MISCELLANEOUS) IMPLANT
GLOVE SURG LX 7.5 STRW (GLOVE) ×2
GLOVE SURG LX STRL 7.5 STRW (GLOVE) ×2 IMPLANT
GLOVE SURG TRIUMPH 8.0 PF LTX (GLOVE) ×2 IMPLANT
GOWN STRL REUS W/ TWL LRG LVL3 (GOWN DISPOSABLE) ×2 IMPLANT
GOWN STRL REUS W/TWL LRG LVL3 (GOWN DISPOSABLE) ×2
LENS IOL RESTOR 17.5 (Intraocular Lens) ×2 IMPLANT
MARKER SKIN DUAL TIP RULER LAB (MISCELLANEOUS) ×2 IMPLANT
NDL RETROBULBAR .5 NSTRL (NEEDLE) IMPLANT
NEEDLE FILTER BLUNT 18X 1/2SAF (NEEDLE) ×1
NEEDLE FILTER BLUNT 18X1 1/2 (NEEDLE) ×1 IMPLANT
PACK CATARACT BRASINGTON (MISCELLANEOUS) ×2 IMPLANT
PACK EYE AFTER SURG (MISCELLANEOUS) ×2 IMPLANT
PACK OPTHALMIC (MISCELLANEOUS) ×2 IMPLANT
RING MALYGIN 7.0 (MISCELLANEOUS) IMPLANT
SUT ETHILON 10-0 CS-B-6CS-B-6 (SUTURE)
SUT VICRYL  9 0 (SUTURE)
SUT VICRYL 9 0 (SUTURE) IMPLANT
SUTURE EHLN 10-0 CS-B-6CS-B-6 (SUTURE) IMPLANT
SYR 3ML LL SCALE MARK (SYRINGE) ×2 IMPLANT
SYR 5ML LL (SYRINGE) ×2 IMPLANT
SYR TB 1ML LUER SLIP (SYRINGE) ×2 IMPLANT
WATER STERILE IRR 250ML POUR (IV SOLUTION) ×2 IMPLANT
WIPE NON LINTING 3.25X3.25 (MISCELLANEOUS) ×2 IMPLANT

## 2015-11-10 NOTE — Transfer of Care (Signed)
Immediate Anesthesia Transfer of Care Note  Patient: Kathie RhodesRobert H Cassetta  Procedure(s) Performed: Procedure(s) with comments: CATARACT EXTRACTION PHACO AND INTRAOCULAR LENS PLACEMENT (IOC) (Right) - DIABETIC-oral meds RESTOR TORIC MULTIFOCAL LENS  Patient Location: PACU  Anesthesia Type: MAC  Level of Consciousness: awake, alert  and patient cooperative  Airway and Oxygen Therapy: Patient Spontanous Breathing and Patient connected to supplemental oxygen  Post-op Assessment: Post-op Vital signs reviewed, Patient's Cardiovascular Status Stable, Respiratory Function Stable, Patent Airway and No signs of Nausea or vomiting  Post-op Vital Signs: Reviewed and stable  Complications: No apparent anesthesia complications

## 2015-11-10 NOTE — Anesthesia Preprocedure Evaluation (Signed)
Anesthesia Evaluation  Patient identified by MRN, date of birth, ID band Patient awake    Reviewed: Allergy & Precautions, H&P , NPO status , Patient's Chart, lab work & pertinent test results  Airway Mallampati: II  TM Distance: >3 FB Neck ROM: full    Dental  (+) Upper Dentures   Pulmonary former smoker,    Pulmonary exam normal        Cardiovascular hypertension, + CAD, + Past MI and + Peripheral Vascular Disease  Normal cardiovascular exam     Neuro/Psych    GI/Hepatic   Endo/Other  diabetesMorbid obesity  Renal/GU      Musculoskeletal   Abdominal   Peds  Hematology   Anesthesia Other Findings   Reproductive/Obstetrics                             Anesthesia Physical Anesthesia Plan  ASA: III  Anesthesia Plan: MAC   Post-op Pain Management:    Induction:   Airway Management Planned:   Additional Equipment:   Intra-op Plan:   Post-operative Plan:   Informed Consent: I have reviewed the patients History and Physical, chart, labs and discussed the procedure including the risks, benefits and alternatives for the proposed anesthesia with the patient or authorized representative who has indicated his/her understanding and acceptance.     Plan Discussed with:   Anesthesia Plan Comments:         Anesthesia Quick Evaluation

## 2015-11-10 NOTE — Anesthesia Postprocedure Evaluation (Signed)
Anesthesia Post Note  Patient: Jake Castro  Procedure(s) Performed: Procedure(s) (LRB): CATARACT EXTRACTION PHACO AND INTRAOCULAR LENS PLACEMENT (IOC) (Right)  Patient location during evaluation: PACU Anesthesia Type: MAC Level of consciousness: awake and alert and oriented Pain management: satisfactory to patient Vital Signs Assessment: post-procedure vital signs reviewed and stable Respiratory status: spontaneous breathing, nonlabored ventilation and respiratory function stable Cardiovascular status: blood pressure returned to baseline and stable Postop Assessment: Adequate PO intake and No signs of nausea or vomiting Anesthetic complications: no    Cherly BeachStella, Esmae Donathan J

## 2015-11-10 NOTE — H&P (Signed)
  The History and Physical notes are on paper, have been signed, and are to be scanned. The patient remains stable and unchanged from the H&P.   Previous H&P reviewed, patient examined, and there are no changes.  Jake Castro 11/10/2015 7:33 AM

## 2015-11-10 NOTE — Op Note (Signed)
LOCATION:  [facility]   PREOPERATIVE DIAGNOSIS:  Nuclear sclerotic cataract of the right eye.  H25.11   POSTOPERATIVE DIAGNOSIS:  Nuclear sclerotic cataract of the right eye.   PROCEDURE:  Phacoemulsification with Toric posterior chamber intraocular lens placement of the right eye.   LENS:   Implant Name Type Inv. Item Serial No. Manufacturer Lot No. LRB No. Used  restor toric multifocal toric lens Intraocular Lens   [id number] ALCON   Right 1     17.5 D ReSTOR 2.5 Toric intraocular lens with 1.5 diopters of cylindrical power with axis orientation at 15 degrees.   ULTRASOUND TIME: 17 % of 1 minutes, 34 seconds.  CDE 15.6   SURGEON:  Deirdre Evenerhadwick R. Raelynne Ludwick, MD   ANESTHESIA: Topical with tetracaine drops and 2% Xylocaine jelly, augmented with 1% preservative-free intracameral lidocaine. .   COMPLICATIONS:  None.   DESCRIPTION OF PROCEDURE:  The patient was identified in the holding room and transported to the operating suite and placed in the supine position under the operating microscope.  The right eye was identified as the operative eye, and it was prepped and draped in the usual sterile ophthalmic fashion.    A clear-corneal paracentesis incision was made at the 12:00 position.  0.5 ml of preservative-free 1% lidocaine was injected into the anterior chamber. The anterior chamber was filled with Viscoat.  A 2.4 millimeter near clear corneal incision was then made at the 9:00 position.  A cystotome and capsulorrhexis forceps were then used to make a curvilinear capsulorrhexis.  Hydrodissection and hydrodelineation were then performed using balanced salt solution.   Phacoemulsification was then used in stop and chop fashion to remove the lens, nucleus and epinucleus.  The remaining cortex was aspirated using the irrigation and aspiration handpiece.  Provisc viscoelastic was then placed into the capsular bag to distend it for lens placement.  The Verion digital marker was used to  align the implant at the intended axis.   A Toric lens was then injected into the capsular bag.  It was rotated clockwise until the axis marks on the lens were approximately 15 degrees in the counterclockwise direction to the intended alignment.  The viscoelastic was aspirated from the eye using the irrigation aspiration handpiece.  Then, a Koch spatula through the sideport incision was used to rotate the lens in a clockwise direction until the axis markings of the intraocular lens were lined up with the Verion alignment.  Balanced salt solution was then used to hydrate the wounds. Sharyl [email or web address]    The eye was noted to have a physiologic pressure and there was no wound leak noted.   Timolol and Brimonidine drops were applied to the eye.  The patient was taken to the recovery room in stable condition having had no complications of anesthesia or surgery.  Uday Jantz 11/10/2015, 8:12 AM

## 2015-11-10 NOTE — Op Note (Deleted)
LOCATION:  [facility]   PREOPERATIVE DIAGNOSIS:    Nuclear sclerotic cataract right eye. H25.11   POSTOPERATIVE DIAGNOSIS:  Nuclear sclerotic cataract right eye.     PROCEDURE:  Phacoemusification with posterior chamber intraocular lens placement of the right eye   LENS:   Implant Name Type Inv. Item Serial No. Manufacturer Lot No. LRB No. Used  restor toric multifocal toric lens Intraocular Lens   [id number] ALCON   Right 1     [id number] 17.5 D Toric Restor (2.5 Add/ 1.5 Cyl)   ULTRASOUND TIME: 17 % of 1 minutes, 34 seconds.  CDE 15.6   SURGEON:  Deirdre Evenerhadwick R. Casanova Schurman, MD   ANESTHESIA:  Topical with tetracaine drops and 2% Xylocaine jelly, augmented with 1% preservative-free intracameral lidocaine.    COMPLICATIONS:  None.   DESCRIPTION OF PROCEDURE:  The patient was identified in the holding room and transported to the operating room and placed in the supine position under the operating microscope.  The right eye was identified as the operative eye and it was prepped and draped in the usual sterile ophthalmic fashion.   A 1 millimeter clear-corneal paracentesis was made at the 12:00 position.  0.5 ml of preservative-free 1% lidocaine was injected into the anterior chamber. The anterior chamber was filled with Viscoat viscoelastic.  A 2.4 millimeter keratome was used to make a near-clear corneal incision at the 9:00 position.  A curvilinear capsulorrhexis was made with a cystotome and capsulorrhexis forceps.  Balanced salt solution was used to hydrodissect and hydrodelineate the nucleus.   Phacoemulsification was then used in stop and chop fashion to remove the lens nucleus and epinucleus.  The remaining cortex was then removed using the irrigation and aspiration handpiece. Provisc was then placed into the capsular bag to distend it for lens placement.  A lens was then injected into the capsular bag.  The remaining viscoelastic was aspirated.   Wounds were hydrated with  balanced salt solution.  The anterior chamber was inflated to a physiologic pressure with balanced salt solution.  No wound leaks were noted. Cefuroxime 0.1 ml of a 10mg /ml solution was injected into the anterior chamber for a dose of 1 mg of intracameral antibiotic at the completion of the case.   Timolol and Brimonidine drops were applied to the eye.  The patient was taken to the recovery room in stable condition without complications of anesthesia or surgery.   Jake Castro 11/10/2015, 8:09 AM

## 2015-11-10 NOTE — Anesthesia Procedure Notes (Signed)
Procedure Name: MAC Date/Time: 11/10/2015 7:45 AM Performed by: Maryan RuedWILSON, Deshunda Thackston M Pre-anesthesia Checklist: Patient identified, Emergency Drugs available, Suction available, Patient being monitored and Timeout performed Patient Re-evaluated:Patient Re-evaluated prior to inductionOxygen Delivery Method: Nasal cannula

## 2015-11-11 ENCOUNTER — Encounter: Payer: Self-pay | Admitting: Ophthalmology

## 2016-01-03 ENCOUNTER — Encounter: Payer: Self-pay | Admitting: *Deleted

## 2016-01-06 ENCOUNTER — Ambulatory Visit: Payer: Medicare Other | Admitting: Certified Registered"

## 2016-01-06 ENCOUNTER — Ambulatory Visit
Admission: RE | Admit: 2016-01-06 | Discharge: 2016-01-06 | Disposition: A | Discharge status: Home / Self Care | Payer: Medicare Other | Source: Ambulatory Visit | Attending: Ophthalmology | Admitting: Ophthalmology

## 2016-01-06 ENCOUNTER — Encounter: Payer: Self-pay | Admitting: *Deleted

## 2016-01-06 ENCOUNTER — Encounter
Admission: RE | Disposition: A | Discharge status: Home / Self Care | Payer: Self-pay | Source: Ambulatory Visit | Attending: Ophthalmology

## 2016-01-06 DIAGNOSIS — I10 Essential (primary) hypertension: Secondary | ICD-10-CM | POA: Insufficient documentation

## 2016-01-06 DIAGNOSIS — H2512 Age-related nuclear cataract, left eye: Secondary | ICD-10-CM | POA: Diagnosis present

## 2016-01-06 DIAGNOSIS — I252 Old myocardial infarction: Secondary | ICD-10-CM | POA: Diagnosis not present

## 2016-01-06 DIAGNOSIS — I25119 Atherosclerotic heart disease of native coronary artery with unspecified angina pectoris: Secondary | ICD-10-CM | POA: Insufficient documentation

## 2016-01-06 DIAGNOSIS — E1151 Type 2 diabetes mellitus with diabetic peripheral angiopathy without gangrene: Secondary | ICD-10-CM | POA: Insufficient documentation

## 2016-01-06 DIAGNOSIS — Z79899 Other long term (current) drug therapy: Secondary | ICD-10-CM | POA: Diagnosis not present

## 2016-01-06 DIAGNOSIS — Z87891 Personal history of nicotine dependence: Secondary | ICD-10-CM | POA: Diagnosis not present

## 2016-01-06 HISTORY — PX: CATARACT EXTRACTION W/PHACO: SHX586

## 2016-01-06 HISTORY — DX: Polyneuropathy, unspecified: G62.9

## 2016-01-06 LAB — GLUCOSE, CAPILLARY: Glucose-Capillary: 132 mg/dL — ABNORMAL HIGH (ref 65–99)

## 2016-01-06 SURGERY — PHACOEMULSIFICATION, CATARACT, WITH IOL INSERTION
Anesthesia: Monitor Anesthesia Care | Site: Eye | Laterality: Left | Wound class: Clean

## 2016-01-06 MED ORDER — MIDAZOLAM HCL 5 MG/5ML IJ SOLN
INTRAMUSCULAR | Status: DC | PRN
  Administered 2016-01-06 (×2): 1 mg via INTRAVENOUS

## 2016-01-06 MED ORDER — CEFUROXIME OPHTHALMIC INJECTION 1 MG/0.1 ML
INJECTION | OPHTHALMIC | Status: AC
  Filled 2016-01-06: qty 0.1

## 2016-01-06 MED ORDER — LIDOCAINE HCL (PF) 4 % IJ SOLN
INTRAOCULAR | Status: DC | PRN
  Administered 2016-01-06: 4 mL via OPHTHALMIC

## 2016-01-06 MED ORDER — ALFENTANIL 500 MCG/ML IJ INJ
INJECTION | INTRAMUSCULAR | Status: DC | PRN
  Administered 2016-01-06 (×2): 500 ug via INTRAVENOUS

## 2016-01-06 MED ORDER — LIDOCAINE HCL (PF) 4 % IJ SOLN
INTRAMUSCULAR | Status: AC
  Filled 2016-01-06: qty 5

## 2016-01-06 MED ORDER — CEFUROXIME OPHTHALMIC INJECTION 1 MG/0.1 ML
INJECTION | OPHTHALMIC | Status: DC | PRN
  Administered 2016-01-06: 1 mg via INTRACAMERAL

## 2016-01-06 MED ORDER — EPINEPHRINE PF 1 MG/ML IJ SOLN
INTRAOCULAR | Status: DC | PRN
  Administered 2016-01-06: 250 mL via OPHTHALMIC

## 2016-01-06 MED ORDER — NEOMYCIN-POLYMYXIN-DEXAMETH 3.5-10000-0.1 OP OINT
TOPICAL_OINTMENT | OPHTHALMIC | Status: AC
  Filled 2016-01-06: qty 3.5

## 2016-01-06 MED ORDER — MOXIFLOXACIN HCL 0.5 % OP SOLN
1.0000 [drp] | OPHTHALMIC | Status: AC
  Administered 2016-01-06 (×3): 1 [drp] via OPHTHALMIC

## 2016-01-06 MED ORDER — NA CHONDROIT SULF-NA HYALURON 40-30 MG/ML IO SOLN
INTRAOCULAR | Status: DC | PRN
  Administered 2016-01-06: 0.5 mL via INTRAOCULAR

## 2016-01-06 MED ORDER — NEOMYCIN-POLYMYXIN-DEXAMETH 0.1 % OP OINT
TOPICAL_OINTMENT | OPHTHALMIC | Status: DC | PRN
  Administered 2016-01-06: 1 via OPHTHALMIC

## 2016-01-06 MED ORDER — ARMC OPHTHALMIC DILATING DROPS
1.0000 "application " | OPHTHALMIC | Status: AC
  Administered 2016-01-06 (×3): 1 via OPHTHALMIC

## 2016-01-06 MED ORDER — SODIUM CHLORIDE 0.9 % IV SOLN
INTRAVENOUS | Status: DC
  Administered 2016-01-06: 07:00:00 via INTRAVENOUS

## 2016-01-06 MED ORDER — EPINEPHRINE PF 1 MG/ML IJ SOLN
INTRAMUSCULAR | Status: AC
  Filled 2016-01-06: qty 2

## 2016-01-06 MED ORDER — NA HYALUR & NA CHOND-NA HYALUR 0.55-0.5 ML IO KIT
PACK | INTRAOCULAR | Status: AC
  Filled 2016-01-06: qty 1.05

## 2016-01-06 MED ORDER — CARBACHOL 0.01 % IO SOLN
INTRAOCULAR | Status: DC | PRN
  Administered 2016-01-06: 0.5 mL via INTRAOCULAR

## 2016-01-06 SURGICAL SUPPLY — 21 items
CANNULA ANT/CHMB 27GA (MISCELLANEOUS) ×2 IMPLANT
CUP MEDICINE 2OZ PLAST GRAD ST (MISCELLANEOUS) ×2 IMPLANT
GLOVE BIO SURGEON STRL SZ8 (GLOVE) ×2 IMPLANT
GLOVE BIOGEL M 6.5 STRL (GLOVE) ×2 IMPLANT
GLOVE SURG LX 7.5 STRW (GLOVE) ×1
GLOVE SURG LX STRL 7.5 STRW (GLOVE) ×1 IMPLANT
GOWN STRL REUS W/ TWL LRG LVL3 (GOWN DISPOSABLE) ×2 IMPLANT
GOWN STRL REUS W/TWL LRG LVL3 (GOWN DISPOSABLE) ×2
LENS IOL RESTOR 18.5 (Intraocular Lens) ×2 IMPLANT
PACK CATARACT (MISCELLANEOUS) ×2 IMPLANT
PACK CATARACT BRASINGTON LX (MISCELLANEOUS) ×2 IMPLANT
PACK EYE AFTER SURG (MISCELLANEOUS) ×2 IMPLANT
SOL BSS BAG (MISCELLANEOUS) ×2
SOL PREP PVP 2OZ (MISCELLANEOUS) ×2
SOLUTION BSS BAG (MISCELLANEOUS) ×1 IMPLANT
SOLUTION PREP PVP 2OZ (MISCELLANEOUS) ×1 IMPLANT
SYR 3ML LL SCALE MARK (SYRINGE) ×2 IMPLANT
SYR 5ML LL (SYRINGE) ×2 IMPLANT
SYR TB 1ML 27GX1/2 LL (SYRINGE) ×2 IMPLANT
WATER STERILE IRR 250ML POUR (IV SOLUTION) ×2 IMPLANT
WIPE NON LINTING 3.25X3.25 (MISCELLANEOUS) ×2 IMPLANT

## 2016-01-06 NOTE — Transfer of Care (Signed)
Immediate Anesthesia Transfer of Care Note  Patient: Jake RhodesRobert H Corman  Procedure(s) Performed: Procedure(s) with comments: CATARACT EXTRACTION PHACO AND INTRAOCULAR LENS PLACEMENT (IOC) (Left) - Lot# [id number] H US:01:12.1 AP%: 18.5 CDE: 13.36  Patient Location: PACU  Anesthesia Type:MAC  Level of Consciousness: awake, alert , oriented and patient cooperative  Airway & Oxygen Therapy: Patient Spontanous Breathing  Post-op Assessment: Report given to RN, Post -op Vital signs reviewed and stable and Patient moving all extremities X 4  Post vital signs: Reviewed and stable  Last Vitals:  Vitals:   01/06/16 0703  BP: 127/83  Pulse: 71  Resp: 18  Temp: 36.6 C    Last Pain:  Vitals:   01/06/16 0703  TempSrc: Oral         Complications: No apparent anesthesia complications

## 2016-01-06 NOTE — Anesthesia Preprocedure Evaluation (Addendum)
Anesthesia Evaluation  Patient identified by MRN, date of birth, ID band Patient awake    Reviewed: Allergy & Precautions, NPO status , Patient's Chart, lab work & pertinent test results, reviewed documented beta blocker date and time   Airway Mallampati: III  TM Distance: >3 FB     Dental  (+) Chipped, Upper Dentures, Partial Lower   Pulmonary pneumonia, resolved, former smoker,           Cardiovascular hypertension, Pt. on medications and Pt. on home beta blockers + angina + CAD, + Past MI and + Peripheral Vascular Disease       Neuro/Psych  Neuromuscular disease    GI/Hepatic   Endo/Other  diabetes  Renal/GU      Musculoskeletal   Abdominal   Peds  Hematology   Anesthesia Other Findings   Reproductive/Obstetrics                            Anesthesia Physical Anesthesia Plan  ASA: III  Anesthesia Plan: MAC   Post-op Pain Management:    Induction:   Airway Management Planned:   Additional Equipment:   Intra-op Plan:   Post-operative Plan:   Informed Consent: I have reviewed the patients History and Physical, chart, labs and discussed the procedure including the risks, benefits and alternatives for the proposed anesthesia with the patient or authorized representative who has indicated his/her understanding and acceptance.     Plan Discussed with: CRNA  Anesthesia Plan Comments:         Anesthesia Quick Evaluation

## 2016-01-06 NOTE — H&P (Signed)
The History and Physical notes are on paper, have been signed, and are to be scanned. The patient remains stable and unchanged from the H&P.   Previous H&P reviewed, patient examined, and there are no changes.  Coleen Cardiff 01/06/2016 7:38 AM

## 2016-01-06 NOTE — Discharge Instructions (Signed)
Eye Surgery Discharge Instructions  Expect mild scratchy sensation or mild soreness. DO NOT RUB YOUR EYE!  The day of surgery:  Minimal physical activity, but bed rest is not required  No reading, computer work, or close hand work  No bending, lifting, or straining.  May watch TV  For 24 hours:  No driving, legal decisions, or alcoholic beverages  Safety precautions  Eat anything you prefer: It is better to start with liquids, then soup then solid foods.  _____ Eye patch should be worn until postoperative exam tomorrow.  ____ Solar shield eyeglasses should be worn for comfort in the sunlight/patch while sleeping  Resume all regular medications including aspirin or Coumadin if these were discontinued prior to surgery. You may shower, bathe, shave, or wash your hair. Tylenol may be taken for mild discomfort.  Call your doctor if you experience significant pain, nausea, or vomiting, fever > 101 or other signs of infection. [phone] or [phone] Specific instructions:  Follow-up Information    Lockie MolaBRASINGTON,CHADWICK, MD .   Specialty:  Ophthalmology Why:  01/07/16 at 9:00 Contact information: [address]   [place] [address] [phone]          Eye Surgery Discharge Instructions  Expect mild scratchy sensation or mild soreness. DO NOT RUB YOUR EYE!  The day of surgery:  Minimal physical activity, but bed rest is not required  No reading, computer work, or close hand work  No bending, lifting, or straining.  May watch TV  For 24 hours:  No driving, legal decisions, or alcoholic beverages  Safety precautions  Eat anything you prefer: It is better to start with liquids, then soup then solid foods.  _____ Eye patch should be worn until postoperative exam tomorrow.  ____ Solar shield eyeglasses should be worn for comfort in the sunlight/patch while sleeping  Resume all regular medications including aspirin or Coumadin if these were  discontinued prior to surgery. You may shower, bathe, shave, or wash your hair. Tylenol may be taken for mild discomfort.  Call your doctor if you experience significant pain, nausea, or vomiting, fever > 101 or other signs of infection. [phone] or [phone] Specific instructions:  Follow-up Information    Lockie MolaBRASINGTON,CHADWICK, MD .   Specialty:  Ophthalmology Why:  01/07/16 at 9:00 Contact information: [address]   [place] [address] [phone]

## 2016-01-06 NOTE — Anesthesia Postprocedure Evaluation (Signed)
Anesthesia Post Note  Patient: Jake Castro  Procedure(s) Performed: Procedure(s) (LRB): CATARACT EXTRACTION PHACO AND INTRAOCULAR LENS PLACEMENT (IOC) (Left)  Patient location during evaluation: PACU Anesthesia Type: MAC Level of consciousness: awake and alert, oriented and patient cooperative Pain management: pain level controlled Vital Signs Assessment: post-procedure vital signs reviewed and stable Respiratory status: spontaneous breathing, nonlabored ventilation and respiratory function stable Cardiovascular status: stable and blood pressure returned to baseline Anesthetic complications: no    Last Vitals:  Vitals:   01/06/16 0703  BP: 127/83  Pulse: 71  Resp: 18  Temp: 36.6 C    Last Pain:  Vitals:   01/06/16 0703  TempSrc: Oral                 Michaele OfferSavage,  [address] A

## 2016-01-06 NOTE — Op Note (Signed)
  PREOPERATIVE DIAGNOSIS:  Nuclear sclerotic cataract of the left eye.  H25.12  POSTOPERATIVE DIAGNOSIS:  Nuclear sclerotic cataract of the left eye.   PROCEDURE:  Phacoemulsification with Toric posterior chamber intraocular lens placement of the left eye.   LENS:  Implant Name Type Inv. Item Serial No. Manufacturer Lot No. LRB No. Used  LENS IOL RESTOR 18.5 - [id number] Intraocular Lens LENS IOL RESTOR 18.5 [id number] ALCON   Left 1   ReSTOR 2.5 Toric intraocular lens with 1.5 diopters of cylindrical power with axis orientation at 159 degrees.   ULTRASOUND TIME: 19 % of 1 minutes, 12 seconds.  CDE 13.4   SURGEON:  Deirdre Evenerhadwick R. Danyka Merlin, MD   ANESTHESIA:  Topical with tetracaine drops and 2% Xylocaine jelly, augmented with 1% preservative-free intracameral lidocaine.  COMPLICATIONS:  None.   DESCRIPTION OF PROCEDURE:  The patient was identified in the holding room and transported to the operating suite and placed in the supine position under the operating microscope.  The left eye was identified as the operative eye, and it was prepped and draped in the usual sterile ophthalmic fashion.    A clear-corneal paracentesis incision was made at the 1:30 position.  0.5 ml of preservative-free 1% lidocaine was injected into the anterior chamber. The anterior chamber was filled with Viscoat.  A 2.4 millimeter near clear corneal incision was then made at the 10:30 position.  A cystotome and capsulorrhexis forceps were then used to make a curvilinear capsulorrhexis.  Hydrodissection and hydrodelineation were then performed using balanced salt solution.   Phacoemulsification was then used in stop and chop fashion to remove the lens, nucleus and epinucleus.  The remaining cortex was aspirated using the irrigation and aspiration handpiece.  Provisc viscoelastic was then placed into the capsular bag to distend it for lens placement.  The Verion digital marker was used to align the implant at the  intended axis.   A 18.5 diopter lens was then injected into the capsular bag.  It was rotated clockwise until the axis marks on the lens were approximately 15 degrees in the counterclockwise direction to the intended alignment.  The viscoelastic was aspirated from the eye using the irrigation aspiration handpiece.  Then, a Koch spatula through the sideport incision was used to rotate the lens in a clockwise direction until the axis markings of the intraocular lens were lined up with the Verion alignment.  Balanced salt solution was then used to hydrate the wounds. Miostat was placed into the anterior chamber.  Cefuroxime 0.1 ml of a 10mg /ml solution was injected into the anterior chamber for a dose of 1 mg of intracameral antibiotic at the completion of the case.    The eye was noted to have a physiologic pressure and there was no wound leak noted.   Vigamox drops and Maxitrol ointment were applied to the eye.  The patient was taken to the recovery room in stable condition having had no complications of anesthesia or surgery.  Jake Castro 01/06/2016, 8:39 AM

## 2016-02-28 ENCOUNTER — Encounter: Payer: Self-pay | Admitting: *Deleted

## 2016-02-29 NOTE — Discharge Instructions (Signed)
Cataract Surgery, Care After °Refer to this sheet in the next few weeks. These instructions provide you with information about caring for yourself after your procedure. Your health care provider may also give you more specific instructions. Your treatment has been planned according to current medical practices, but problems sometimes occur. Call your health care provider if you have any problems or questions after your procedure. °What can I expect after the procedure? °After the procedure, it is common to have: °· Itching. °· Discomfort. °· Fluid discharge. °· Sensitivity to light and to touch. °· Bruising. °Follow these instructions at home: °Eye Care  °· Check your eye every day for signs of infection. Watch for: °¨ Redness, swelling, or pain. °¨ Fluid, blood, or pus. °¨ Warmth. °¨ Bad smell. °Activity  °· Avoid strenuous activities, such as playing contact sports, for as long as told by your health care provider. °· Do not drive or operate heavy machinery until your health care provider approves. °· Do not bend or lift heavy objects . Bending increases pressure in the eye. You can walk, climb stairs, and do light household chores. °· Ask your health care provider when you can return to work. If you work in a dusty environment, you may be advised to wear protective eyewear for a period of time. °General instructions  °· Take or apply over-the-counter and prescription medicines only as told by your health care provider. This includes eye drops. °· Do not touch or rub your eyes. °· If you were given a protective shield, wear it as told by your health care provider. If you were not given a protective shield, wear sunglasses as told by your health care provider to protect your eyes. °· Keep the area around your eye clean and dry. Avoid swimming or allowing water to hit you directly in the face while showering until told by your health care provider. Keep soap and shampoo out of your eyes. °· Do not put a contact lens  into the affected eye or eyes until your health care provider approves. °· Keep all follow-up visits as told by your health care provider. This is important. °Contact a health care provider if: ° °· You have increased bruising around your eye. °· You have pain that is not helped with medicine. °· You have a fever. °· You have redness, swelling, or pain in your eye. °· You have fluid, blood, or pus coming from your incision. °· Your vision gets worse. °Get help right away if: °· You have sudden vision loss. °This information is not intended to replace advice given to you by your health care provider. Make sure you discuss any questions you have with your health care provider. °Document Released: 09/23/2004 Document Revised: 07/15/2015 Document Reviewed: 01/14/2015 °Elsevier Interactive Patient Education © 2017 [employer] ° ° ° ° °General Anesthesia, Adult, Care After °These instructions provide you with information about caring for yourself after your procedure. Your health care provider may also give you more specific instructions. Your treatment has been planned according to current medical practices, but problems sometimes occur. Call your health care provider if you have any problems or questions after your procedure. °What can I expect after the procedure? °After the procedure, it is common to have: °· Vomiting. °· A sore throat. °· Mental slowness. °It is common to feel: °· Nauseous. °· Cold or shivery. °· Sleepy. °· Tired. °· Sore or achy, even in parts of your body where you did not have surgery. °Follow these instructions at   home: °For at least 24 hours after the procedure:  °· Do not: °¨ Participate in activities where you could fall or become injured. °¨ Drive. °¨ Use heavy machinery. °¨ Drink alcohol. °¨ Take sleeping pills or medicines that cause drowsiness. °¨ Make important decisions or sign legal documents. °¨ Take care of children on your own. °· Rest. °Eating and drinking  °· If you vomit, drink  water, juice, or soup when you can drink without vomiting. °· Drink enough fluid to keep your urine clear or pale yellow. °· Make sure you have little or no nausea before eating solid foods. °· Follow the diet recommended by your health care provider. °General instructions  °· Have a responsible adult stay with you until you are awake and alert. °· Return to your normal activities as told by your health care provider. Ask your health care provider what activities are safe for you. °· Take over-the-counter and prescription medicines only as told by your health care provider. °· If you smoke, do not smoke without supervision. °· Keep all follow-up visits as told by your health care provider. This is important. °Contact a health care provider if: °· You continue to have nausea or vomiting at home, and medicines are not helpful. °· You cannot drink fluids or start eating again. °· You cannot urinate after 8-12 hours. °· You develop a skin rash. °· You have fever. °· You have increasing redness at the site of your procedure. °Get help right away if: °· You have difficulty breathing. °· You have chest pain. °· You have unexpected bleeding. °· You feel that you are having a life-threatening or urgent problem. °This information is not intended to replace advice given to you by your health care provider. Make sure you discuss any questions you have with your health care provider. °Document Released: 06/12/2000 Document Revised: 08/09/2015 Document Reviewed: 02/18/2015 °Elsevier Interactive Patient Education © 2017 [employer] ° °

## 2016-03-01 ENCOUNTER — Ambulatory Visit: Payer: Medicare Other | Admitting: Anesthesiology

## 2016-03-01 ENCOUNTER — Ambulatory Visit
Admission: RE | Admit: 2016-03-01 | Discharge: 2016-03-01 | Disposition: A | Discharge status: Home / Self Care | Payer: Medicare Other | Source: Ambulatory Visit | Attending: Ophthalmology | Admitting: Ophthalmology

## 2016-03-01 ENCOUNTER — Encounter
Admission: RE | Disposition: A | Discharge status: Home / Self Care | Payer: Self-pay | Source: Ambulatory Visit | Attending: Ophthalmology

## 2016-03-01 DIAGNOSIS — H5231 Anisometropia: Secondary | ICD-10-CM | POA: Diagnosis not present

## 2016-03-01 DIAGNOSIS — I2510 Atherosclerotic heart disease of native coronary artery without angina pectoris: Secondary | ICD-10-CM | POA: Diagnosis not present

## 2016-03-01 DIAGNOSIS — Z955 Presence of coronary angioplasty implant and graft: Secondary | ICD-10-CM | POA: Insufficient documentation

## 2016-03-01 DIAGNOSIS — Z6837 Body mass index (BMI) 37.0-37.9, adult: Secondary | ICD-10-CM | POA: Insufficient documentation

## 2016-03-01 DIAGNOSIS — Z87891 Personal history of nicotine dependence: Secondary | ICD-10-CM | POA: Insufficient documentation

## 2016-03-01 DIAGNOSIS — I10 Essential (primary) hypertension: Secondary | ICD-10-CM | POA: Diagnosis not present

## 2016-03-01 DIAGNOSIS — I739 Peripheral vascular disease, unspecified: Secondary | ICD-10-CM | POA: Diagnosis not present

## 2016-03-01 DIAGNOSIS — Z961 Presence of intraocular lens: Secondary | ICD-10-CM | POA: Insufficient documentation

## 2016-03-01 DIAGNOSIS — E119 Type 2 diabetes mellitus without complications: Secondary | ICD-10-CM | POA: Diagnosis not present

## 2016-03-01 DIAGNOSIS — I252 Old myocardial infarction: Secondary | ICD-10-CM | POA: Diagnosis not present

## 2016-03-01 HISTORY — PX: REMOVE AND REPLACE LENS: SHX6308

## 2016-03-01 LAB — GLUCOSE, CAPILLARY
GLUCOSE-CAPILLARY: 152 mg/dL — AB (ref 65–99)
Glucose-Capillary: 147 mg/dL — ABNORMAL HIGH (ref 65–99)

## 2016-03-01 SURGERY — REMOVAL OR REPLACEMENT, IOL
Anesthesia: Monitor Anesthesia Care | Site: Eye | Laterality: Right | Wound class: Clean

## 2016-03-01 MED ORDER — NA HYALUR & NA CHOND-NA HYALUR 0.4-0.35 ML IO KIT
PACK | INTRAOCULAR | Status: DC | PRN
  Administered 2016-03-01: 1 mL via INTRAOCULAR

## 2016-03-01 MED ORDER — BSS IO SOLN
INTRAOCULAR | Status: DC | PRN
  Administered 2016-03-01: 27 mL via OPHTHALMIC

## 2016-03-01 MED ORDER — BRIMONIDINE TARTRATE 0.2 % OP SOLN
OPHTHALMIC | Status: DC | PRN
  Administered 2016-03-01: 1 [drp] via OPHTHALMIC

## 2016-03-01 MED ORDER — ACETAMINOPHEN 160 MG/5ML PO SOLN: 325.0000 mg | ORAL | Status: DC | PRN

## 2016-03-01 MED ORDER — BALANCED SALT IO SOLN
INTRAOCULAR | Status: DC | PRN
  Administered 2016-03-01: 1 mL via OPHTHALMIC

## 2016-03-01 MED ORDER — TIMOLOL MALEATE 0.5 % OP SOLN
OPHTHALMIC | Status: DC | PRN
  Administered 2016-03-01: 1 [drp] via OPHTHALMIC

## 2016-03-01 MED ORDER — ARMC OPHTHALMIC DILATING DROPS
1.0000 | OPHTHALMIC | Status: DC | PRN
Start: 2016-03-01 — End: 2016-03-01
  Administered 2016-03-01 (×3): 1 via OPHTHALMIC

## 2016-03-01 MED ORDER — FENTANYL CITRATE (PF) 100 MCG/2ML IJ SOLN
INTRAMUSCULAR | Status: DC | PRN
  Administered 2016-03-01: 100 ug via INTRAVENOUS

## 2016-03-01 MED ORDER — MIDAZOLAM HCL 2 MG/2ML IJ SOLN
INTRAMUSCULAR | Status: DC | PRN
  Administered 2016-03-01 (×2): 2 mg via INTRAVENOUS

## 2016-03-01 MED ORDER — ACETAMINOPHEN 325 MG PO TABS: 325.0000 mg | ORAL_TABLET | ORAL | Status: DC | PRN

## 2016-03-01 MED ORDER — MOXIFLOXACIN HCL 0.5 % OP SOLN
1.0000 [drp] | OPHTHALMIC | Status: DC | PRN
  Administered 2016-03-01 (×3): 1 [drp] via OPHTHALMIC

## 2016-03-01 MED ORDER — CEFUROXIME OPHTHALMIC INJECTION 1 MG/0.1 ML
INJECTION | OPHTHALMIC | Status: DC | PRN
  Administered 2016-03-01: .3 mL via OPHTHALMIC

## 2016-03-01 SURGICAL SUPPLY — 25 items
CANNULA ANT/CHMB 27GA (MISCELLANEOUS) ×3 IMPLANT
CARTRIDGE ABBOTT (MISCELLANEOUS) IMPLANT
GLOVE SURG LX 7.5 STRW (GLOVE) ×1
GLOVE SURG LX STRL 7.5 STRW (GLOVE) ×2 IMPLANT
GLOVE SURG TRIUMPH 8.0 PF LTX (GLOVE) ×3 IMPLANT
GOWN STRL REUS W/ TWL LRG LVL3 (GOWN DISPOSABLE) ×4 IMPLANT
GOWN STRL REUS W/TWL LRG LVL3 (GOWN DISPOSABLE) ×2
LENS IOL RESTOR 19.5 (Intraocular Lens) ×3 IMPLANT
MARKER SKIN DUAL TIP RULER LAB (MISCELLANEOUS) ×3 IMPLANT
NDL RETROBULBAR .5 NSTRL (NEEDLE) IMPLANT
NEEDLE FILTER BLUNT 18X 1/2SAF (NEEDLE) ×1
NEEDLE FILTER BLUNT 18X1 1/2 (NEEDLE) ×2 IMPLANT
PACK CATARACT BRASINGTON (MISCELLANEOUS) ×3 IMPLANT
PACK EYE AFTER SURG (MISCELLANEOUS) ×3 IMPLANT
PACK OPTHALMIC (MISCELLANEOUS) ×3 IMPLANT
RING MALYGIN 7.0 (MISCELLANEOUS) IMPLANT
SUT ETHILON 10-0 CS-B-6CS-B-6 (SUTURE)
SUT VICRYL  9 0 (SUTURE)
SUT VICRYL 9 0 (SUTURE) IMPLANT
SUTURE EHLN 10-0 CS-B-6CS-B-6 (SUTURE) IMPLANT
SYR 3ML LL SCALE MARK (SYRINGE) ×3 IMPLANT
SYR 5ML LL (SYRINGE) ×3 IMPLANT
SYR TB 1ML LUER SLIP (SYRINGE) ×3 IMPLANT
WATER STERILE IRR 250ML POUR (IV SOLUTION) ×3 IMPLANT
WIPE NON LINTING 3.25X3.25 (MISCELLANEOUS) ×3 IMPLANT

## 2016-03-01 NOTE — Op Note (Signed)
LOCATION:  [facility]   PREOPERATIVE DIAGNOSIS:   Pseudophakia with hyperopic result and anisometropia   POSTOPERATIVE DIAGNOSIS:  same   PROCEDURE: intraocular lens exchange right eye  LENS:   Implant Name Type Inv. Item Serial No. Manufacturer Lot No. LRB No. Used  iol lens Intraocular Lens     ALCON   Right 1     [id number] ReTOR Toric 19.5 D PCIOL at axis 7 degrees   ULTRASOUND TIME:not applicable   SURGEON:  Deirdre Evenerhadwick R. Henretta Quist, MD   ANESTHESIA:  Topical with tetracaine drops and 2% Xylocaine jelly, augmented with 1% preservative-free intracameral lidocaine.    COMPLICATIONS:  None.   DESCRIPTION OF PROCEDURE:  The patient was identified in the holding room and transported to the operating room and placed in the supine position under the operating microscope.  The right eye was identified as the operative eye and it was prepped and draped in the usual sterile ophthalmic fashion.   A 1 millimeter clear-corneal paracentesis was made at the 12:00 position.  0.5 ml of preservative-free 1% lidocaine was injected into the anterior chamber. The anterior chamber was filled with Viscoat viscoelastic.  A 2.4 millimeter keratome was used to make a near-clear corneal incision at the 9:00 position. Viscoat was used to gently viscodissect the lens from the capsular bag.  It was elevated into the anterior chamber and cut with Vannas scissors. It was removed through the main incision. Provisc was injected into the capsule to distend it for lens placement.  A lens was then injected into the capsular bag.  The remaining viscoelastic was aspirated.  The alignment was placed at 7 degrees using the Verion digital marker.   Wounds were hydrated with balanced salt solution.  The anterior chamber was inflated to a physiologic pressure with balanced salt solution.  No wound leaks were noted. Cefuroxime 0.1 ml of a 10mg /ml solution was injected into the anterior chamber for a dose of 1 mg of  intracameral antibiotic at the completion of the case.   Timolol and Brimonidine drops were applied to the eye.  The patient was taken to the recovery room in stable condition without complications of anesthesia or surgery.   Tysheem Accardo 03/01/2016, 8:48 AM

## 2016-03-01 NOTE — H&P (Signed)
The History and Physical notes are on paper, have been signed, and are to be scanned. The patient remains stable and unchanged from the H&P.   Previous H&P reviewed, patient examined, and there are no changes.  Trayon Krantz 03/01/2016 7:45 AM

## 2016-03-01 NOTE — Anesthesia Postprocedure Evaluation (Signed)
Anesthesia Post Note  Patient: Jake Castro  Procedure(Castro) Performed: Procedure(Castro) (LRB): REMOVE AND REPLACE LENS (Right)  Patient location during evaluation: PACU Anesthesia Type: MAC Level of consciousness: awake and alert Pain management: pain level controlled Vital Signs Assessment: post-procedure vital signs reviewed and stable Respiratory status: spontaneous breathing, nonlabored ventilation, respiratory function stable and patient connected to nasal cannula oxygen Cardiovascular status: stable and blood pressure returned to baseline Anesthetic complications: no    Jake Castro, Jake Castro

## 2016-03-01 NOTE — Transfer of Care (Signed)
Immediate Anesthesia Transfer of Care Note  Patient: Jake Castro  Procedure(s) Performed: Procedure(s): REMOVE AND REPLACE LENS (Right)  Patient Location: PACU  Anesthesia Type: MAC  Level of Consciousness: awake, alert  and patient cooperative  Airway and Oxygen Therapy: Patient Spontanous Breathing and Patient connected to supplemental oxygen  Post-op Assessment: Post-op Vital signs reviewed, Patient's Cardiovascular Status Stable, Respiratory Function Stable, Patent Airway and No signs of Nausea or vomiting  Post-op Vital Signs: Reviewed and stable  Complications: No apparent anesthesia complications

## 2016-03-01 NOTE — Anesthesia Preprocedure Evaluation (Signed)
Anesthesia Evaluation  Patient identified by MRN, date of birth, ID band Patient awake    Reviewed: Allergy & Precautions, H&P , NPO status , Patient's Chart, lab work & pertinent test results, reviewed documented beta blocker date and time   Airway Mallampati: I  TM Distance: >3 FB Neck ROM: full    Dental no notable dental hx.    Pulmonary former smoker,    Pulmonary exam normal breath sounds clear to auscultation       Cardiovascular Exercise Tolerance: Good hypertension, + CAD, + Past MI, + Cardiac Stents and + Peripheral Vascular Disease  Normal cardiovascular exam Rhythm:regular Rate:Normal     Neuro/Psych negative neurological ROS  negative psych ROS   GI/Hepatic negative GI ROS, Neg liver ROS,   Endo/Other  diabetesMorbid obesity  Renal/GU negative Renal ROS  negative genitourinary   Musculoskeletal   Abdominal   Peds  Hematology negative hematology ROS (+)   Anesthesia Other Findings   Reproductive/Obstetrics negative OB ROS                             Anesthesia Physical Anesthesia Plan  ASA: III  Anesthesia Plan: MAC   Post-op Pain Management:    Induction:   Airway Management Planned:   Additional Equipment:   Intra-op Plan:   Post-operative Plan:   Informed Consent: I have reviewed the patients History and Physical, chart, labs and discussed the procedure including the risks, benefits and alternatives for the proposed anesthesia with the patient or authorized representative who has indicated his/her understanding and acceptance.   Dental Advisory Given  Plan Discussed with: CRNA and Anesthesiologist  Anesthesia Plan Comments:         Anesthesia Quick Evaluation

## 2016-03-02 ENCOUNTER — Encounter: Payer: Self-pay | Admitting: Ophthalmology

## 2020-04-21 ENCOUNTER — Inpatient Hospital Stay
Admission: EM | Admit: 2020-04-21 | Discharge: 2020-04-23 | DRG: 280 | Disposition: A | Discharge status: Home / Self Care | Payer: Medicare PPO | Attending: Internal Medicine | Admitting: Internal Medicine

## 2020-04-21 ENCOUNTER — Encounter: Payer: Self-pay | Admitting: Radiology

## 2020-04-21 ENCOUNTER — Emergency Department: Payer: Medicare PPO

## 2020-04-21 ENCOUNTER — Inpatient Hospital Stay
Admit: 2020-04-21 | Discharge: 2020-04-21 | Disposition: A | Discharge status: Home / Self Care | Payer: Medicare PPO | Attending: Nurse Practitioner | Admitting: Nurse Practitioner

## 2020-04-21 ENCOUNTER — Other Ambulatory Visit: Payer: Self-pay

## 2020-04-21 ENCOUNTER — Inpatient Hospital Stay: Payer: Medicare PPO

## 2020-04-21 ENCOUNTER — Ambulatory Visit: Payer: Self-pay | Admitting: Cardiovascular Disease

## 2020-04-21 DIAGNOSIS — I252 Old myocardial infarction: Secondary | ICD-10-CM

## 2020-04-21 DIAGNOSIS — E1140 Type 2 diabetes mellitus with diabetic neuropathy, unspecified: Secondary | ICD-10-CM | POA: Diagnosis present

## 2020-04-21 DIAGNOSIS — I2510 Atherosclerotic heart disease of native coronary artery without angina pectoris: Secondary | ICD-10-CM | POA: Diagnosis present

## 2020-04-21 DIAGNOSIS — I25810 Atherosclerosis of coronary artery bypass graft(s) without angina pectoris: Secondary | ICD-10-CM | POA: Diagnosis present

## 2020-04-21 DIAGNOSIS — Z8249 Family history of ischemic heart disease and other diseases of the circulatory system: Secondary | ICD-10-CM | POA: Diagnosis not present

## 2020-04-21 DIAGNOSIS — D72828 Other elevated white blood cell count: Secondary | ICD-10-CM | POA: Diagnosis present

## 2020-04-21 DIAGNOSIS — I214 Non-ST elevation (NSTEMI) myocardial infarction: Principal | ICD-10-CM | POA: Diagnosis present

## 2020-04-21 DIAGNOSIS — G9341 Metabolic encephalopathy: Secondary | ICD-10-CM | POA: Diagnosis present

## 2020-04-21 DIAGNOSIS — E785 Hyperlipidemia, unspecified: Secondary | ICD-10-CM | POA: Diagnosis present

## 2020-04-21 DIAGNOSIS — N189 Chronic kidney disease, unspecified: Secondary | ICD-10-CM

## 2020-04-21 DIAGNOSIS — E08628 Diabetes mellitus due to underlying condition with other skin complications: Secondary | ICD-10-CM

## 2020-04-21 DIAGNOSIS — F419 Anxiety disorder, unspecified: Secondary | ICD-10-CM | POA: Diagnosis present

## 2020-04-21 DIAGNOSIS — G9340 Encephalopathy, unspecified: Secondary | ICD-10-CM | POA: Diagnosis not present

## 2020-04-21 DIAGNOSIS — Z955 Presence of coronary angioplasty implant and graft: Secondary | ICD-10-CM | POA: Diagnosis not present

## 2020-04-21 DIAGNOSIS — Z7982 Long term (current) use of aspirin: Secondary | ICD-10-CM

## 2020-04-21 DIAGNOSIS — R778 Other specified abnormalities of plasma proteins: Secondary | ICD-10-CM | POA: Diagnosis not present

## 2020-04-21 DIAGNOSIS — Z87891 Personal history of nicotine dependence: Secondary | ICD-10-CM | POA: Diagnosis not present

## 2020-04-21 DIAGNOSIS — Z79899 Other long term (current) drug therapy: Secondary | ICD-10-CM

## 2020-04-21 DIAGNOSIS — Z20822 Contact with and (suspected) exposure to covid-19: Secondary | ICD-10-CM | POA: Diagnosis present

## 2020-04-21 DIAGNOSIS — I10 Essential (primary) hypertension: Secondary | ICD-10-CM | POA: Diagnosis present

## 2020-04-21 DIAGNOSIS — Z7984 Long term (current) use of oral hypoglycemic drugs: Secondary | ICD-10-CM

## 2020-04-21 DIAGNOSIS — N179 Acute kidney failure, unspecified: Secondary | ICD-10-CM | POA: Diagnosis present

## 2020-04-21 DIAGNOSIS — R4182 Altered mental status, unspecified: Secondary | ICD-10-CM

## 2020-04-21 LAB — CBC WITH DIFFERENTIAL/PLATELET
Abs Immature Granulocytes: 0.08 10*3/uL — ABNORMAL HIGH (ref 0.00–0.07)
Basophils Absolute: 0 10*3/uL (ref 0.0–0.1)
Basophils Relative: 0 %
Eosinophils Absolute: 0.6 10*3/uL — ABNORMAL HIGH (ref 0.0–0.5)
Eosinophils Relative: 5 %
HCT: 34.6 % — ABNORMAL LOW (ref 39.0–52.0)
Hemoglobin: 10.6 g/dL — ABNORMAL LOW (ref 13.0–17.0)
Immature Granulocytes: 1 %
Lymphocytes Relative: 14 %
Lymphs Abs: 1.7 10*3/uL (ref 0.7–4.0)
MCH: 28.1 pg (ref 26.0–34.0)
MCHC: 30.6 g/dL (ref 30.0–36.0)
MCV: 91.8 fL (ref 80.0–100.0)
Monocytes Absolute: 0.9 10*3/uL (ref 0.1–1.0)
Monocytes Relative: 7 %
Neutro Abs: 8.9 10*3/uL — ABNORMAL HIGH (ref 1.7–7.7)
Neutrophils Relative %: 73 %
Platelets: 369 10*3/uL (ref 150–400)
RBC: 3.77 MIL/uL — ABNORMAL LOW (ref 4.22–5.81)
RDW: 14.4 % (ref 11.5–15.5)
WBC: 12.3 10*3/uL — ABNORMAL HIGH (ref 4.0–10.5)
nRBC: 0 % (ref 0.0–0.2)

## 2020-04-21 LAB — AMMONIA: Ammonia: 19 umol/L (ref 9–35)

## 2020-04-21 LAB — COMPREHENSIVE METABOLIC PANEL
ALT: 17 U/L (ref 0–44)
AST: 28 U/L (ref 15–41)
Albumin: 3.7 g/dL (ref 3.5–5.0)
Alkaline Phosphatase: 37 U/L — ABNORMAL LOW (ref 38–126)
Anion gap: 11 (ref 5–15)
BUN: 40 mg/dL — ABNORMAL HIGH (ref 8–23)
CO2: 26 mmol/L (ref 22–32)
Calcium: 8.9 mg/dL (ref 8.9–10.3)
Chloride: 101 mmol/L (ref 98–111)
Creatinine, Ser: 1.92 mg/dL — ABNORMAL HIGH (ref 0.61–1.24)
GFR, Estimated: 37 mL/min — ABNORMAL LOW (ref >60)
Glucose, Bld: 141 mg/dL — ABNORMAL HIGH (ref 70–99)
Potassium: 3.9 mmol/L (ref 3.5–5.1)
Sodium: 138 mmol/L (ref 135–145)
Total Bilirubin: 0.7 mg/dL (ref 0.3–1.2)
Total Protein: 7.7 g/dL (ref 6.5–8.1)

## 2020-04-21 LAB — URINALYSIS, COMPLETE (UACMP) WITH MICROSCOPIC
Bacteria, UA: NONE SEEN
Bilirubin Urine: NEGATIVE
Glucose, UA: NEGATIVE mg/dL
Hgb urine dipstick: NEGATIVE
Ketones, ur: NEGATIVE mg/dL
Leukocytes,Ua: NEGATIVE
Nitrite: NEGATIVE
Protein, ur: NEGATIVE mg/dL
Specific Gravity, Urine: 1 — ABNORMAL LOW (ref 1.005–1.030)
Squamous Epithelial / LPF: NONE SEEN (ref 0–5)
WBC, UA: NONE SEEN WBC/hpf (ref 0–5)
pH: 6 (ref 5.0–8.0)

## 2020-04-21 LAB — TROPONIN I (HIGH SENSITIVITY)
Troponin I (High Sensitivity): 487 ng/L (ref <18)
Troponin I (High Sensitivity): 515 ng/L (ref <18)
Troponin I (High Sensitivity): 628 ng/L (ref <18)

## 2020-04-21 LAB — BLOOD GAS, VENOUS
Acid-Base Excess: 1.9 mmol/L (ref 0.0–2.0)
Bicarbonate: 29.2 mmol/L — ABNORMAL HIGH (ref 20.0–28.0)
O2 Saturation: 60.8 %
Patient temperature: 37
pCO2, Ven: 58 mmHg (ref 44.0–60.0)
pH, Ven: 7.31 (ref 7.250–7.430)
pO2, Ven: 35 mmHg (ref 32.0–45.0)

## 2020-04-21 LAB — LACTIC ACID, PLASMA: Lactic Acid, Venous: 1.6 mmol/L (ref 0.5–1.9)

## 2020-04-21 LAB — URINE DRUG SCREEN, QUALITATIVE (ARMC ONLY)
Amphetamines, Ur Screen: NOT DETECTED
Barbiturates, Ur Screen: NOT DETECTED
Benzodiazepine, Ur Scrn: NOT DETECTED
Cannabinoid 50 Ng, Ur ~~LOC~~: NOT DETECTED
Cocaine Metabolite,Ur ~~LOC~~: NOT DETECTED
MDMA (Ecstasy)Ur Screen: NOT DETECTED
Methadone Scn, Ur: NOT DETECTED
Opiate, Ur Screen: POSITIVE — AB
Phencyclidine (PCP) Ur S: NOT DETECTED
Tricyclic, Ur Screen: NOT DETECTED

## 2020-04-21 LAB — GLUCOSE, CAPILLARY: Glucose-Capillary: 82 mg/dL (ref 70–99)

## 2020-04-21 LAB — SARS CORONAVIRUS 2 BY RT PCR (HOSPITAL ORDER, PERFORMED IN ~~LOC~~ HOSPITAL LAB): SARS Coronavirus 2: NEGATIVE

## 2020-04-21 LAB — BRAIN NATRIURETIC PEPTIDE: B Natriuretic Peptide: 87.3 pg/mL (ref 0.0–100.0)

## 2020-04-21 LAB — MAGNESIUM: Magnesium: 1.9 mg/dL (ref 1.7–2.4)

## 2020-04-21 LAB — SALICYLATE LEVEL: Salicylate Lvl: <7 mg/dL — ABNORMAL LOW (ref 7.0–30.0)

## 2020-04-21 LAB — ACETAMINOPHEN LEVEL: Acetaminophen (Tylenol), Serum: <10 ug/mL — ABNORMAL LOW (ref 10–30)

## 2020-04-21 LAB — APTT: aPTT: 34 seconds (ref 24–36)

## 2020-04-21 LAB — FIBRIN DERIVATIVES D-DIMER (ARMC ONLY): Fibrin derivatives D-dimer (ARMC): 797.65 ng/mL (FEU) — ABNORMAL HIGH (ref 0.00–499.00)

## 2020-04-21 MED ORDER — EXENATIDE ER 2 MG ~~LOC~~ SRER: SUBCUTANEOUS | Status: DC

## 2020-04-21 MED ORDER — ASPIRIN EC 325 MG PO TBEC
325.0000 mg | DELAYED_RELEASE_TABLET | Freq: Every day | ORAL | Status: DC
  Administered 2020-04-22: 325 mg via ORAL
  Filled 2020-04-21: qty 1

## 2020-04-21 MED ORDER — ACETAMINOPHEN 650 MG RE SUPP: 650.0000 mg | Freq: Four times a day (QID) | RECTAL | Status: DC | PRN

## 2020-04-21 MED ORDER — INSULIN ASPART 100 UNIT/ML ~~LOC~~ SOLN: 0.0000 [IU] | Freq: Three times a day (TID) | SUBCUTANEOUS | Status: DC

## 2020-04-21 MED ORDER — L-METHYLFOLATE-B6-B12 3-35-2 MG PO TABS
1.0000 | ORAL_TABLET | Freq: Every day | ORAL | Status: DC
  Filled 2020-04-21 (×2): qty 1

## 2020-04-21 MED ORDER — LISINOPRIL 20 MG PO TABS
40.0000 mg | ORAL_TABLET | Freq: Every day | ORAL | Status: DC
Start: 2020-04-22 — End: 2020-04-22
  Administered 2020-04-22: 40 mg via ORAL
  Filled 2020-04-21: qty 2

## 2020-04-21 MED ORDER — CARVEDILOL 12.5 MG PO TABS
12.5000 mg | ORAL_TABLET | Freq: Two times a day (BID) | ORAL | Status: DC
  Administered 2020-04-21 – 2020-04-23 (×3): 12.5 mg via ORAL
  Filled 2020-04-21: qty 1
  Filled 2020-04-21: qty 2
  Filled 2020-04-21: qty 1

## 2020-04-21 MED ORDER — SODIUM CHLORIDE 0.9 % IV SOLN: INTRAVENOUS | Status: DC

## 2020-04-21 MED ORDER — SIMVASTATIN 20 MG PO TABS
40.0000 mg | ORAL_TABLET | Freq: Every day | ORAL | Status: DC
  Administered 2020-04-21 – 2020-04-23 (×3): 40 mg via ORAL
  Filled 2020-04-21: qty 4
  Filled 2020-04-21 (×2): qty 2

## 2020-04-21 MED ORDER — MULTIVITAMINS PO CAPS: 1.0000 | ORAL_CAPSULE | Freq: Every day | ORAL | Status: DC

## 2020-04-21 MED ORDER — ASCORBIC ACID 500 MG PO TABS
1000.0000 mg | ORAL_TABLET | Freq: Every day | ORAL | Status: DC
  Administered 2020-04-22 – 2020-04-23 (×2): 1000 mg via ORAL
  Filled 2020-04-21 (×2): qty 2

## 2020-04-21 MED ORDER — HEPARIN BOLUS VIA INFUSION
4000.0000 [IU] | Freq: Once | INTRAVENOUS | Status: AC
  Administered 2020-04-21: 4000 [IU] via INTRAVENOUS
  Filled 2020-04-21: qty 4000

## 2020-04-21 MED ORDER — IOHEXOL 300 MG/ML  SOLN
75.0000 mL | Freq: Once | INTRAMUSCULAR | Status: AC | PRN
  Administered 2020-04-21: 75 mL via INTRAVENOUS

## 2020-04-21 MED ORDER — SODIUM CHLORIDE 0.9% FLUSH
3.0000 mL | Freq: Two times a day (BID) | INTRAVENOUS | Status: AC
  Filled 2020-04-21: qty 3

## 2020-04-21 MED ORDER — HEPARIN (PORCINE) 25000 UT/250ML-% IV SOLN
1700.0000 [IU]/h | INTRAVENOUS | Status: DC
  Administered 2020-04-21 (×2): 1200 [IU]/h via INTRAVENOUS
  Administered 2020-04-22: 1600 [IU]/h via INTRAVENOUS
  Filled 2020-04-21 (×2): qty 250

## 2020-04-21 MED ORDER — MAGNESIUM HYDROXIDE 400 MG/5ML PO SUSP: 30.0000 mL | Freq: Every day | ORAL | Status: DC | PRN

## 2020-04-21 MED ORDER — PERFLUTREN LIPID MICROSPHERE
1.0000 mL | INTRAVENOUS | Status: AC | PRN
  Administered 2020-04-21: 3 mL via INTRAVENOUS
  Filled 2020-04-21: qty 10

## 2020-04-21 MED ORDER — ASPIRIN EC 81 MG PO TBEC
81.0000 mg | DELAYED_RELEASE_TABLET | Freq: Every day | ORAL | Status: DC
  Administered 2020-04-21: 81 mg via ORAL
  Filled 2020-04-21: qty 1

## 2020-04-21 MED ORDER — ACETAMINOPHEN 325 MG PO TABS: 650.0000 mg | ORAL_TABLET | Freq: Four times a day (QID) | ORAL | Status: DC | PRN

## 2020-04-21 MED ORDER — ONDANSETRON HCL 4 MG PO TABS: 4.0000 mg | ORAL_TABLET | Freq: Four times a day (QID) | ORAL | Status: DC | PRN

## 2020-04-21 MED ORDER — LACTATED RINGERS IV BOLUS
1000.0000 mL | Freq: Once | INTRAVENOUS | Status: AC
  Administered 2020-04-21: 1000 mL via INTRAVENOUS

## 2020-04-21 MED ORDER — ASPIRIN 81 MG PO TABS: 81.0000 mg | ORAL_TABLET | Freq: Every day | ORAL | Status: DC

## 2020-04-21 MED ORDER — TRAZODONE HCL 50 MG PO TABS: 25.0000 mg | ORAL_TABLET | Freq: Every evening | ORAL | Status: DC | PRN

## 2020-04-21 MED ORDER — VITAMIN D 25 MCG (1000 UNIT) PO TABS
5000.0000 [IU] | ORAL_TABLET | Freq: Every day | ORAL | Status: DC
  Administered 2020-04-22 – 2020-04-23 (×2): 5000 [IU] via ORAL
  Filled 2020-04-21 (×2): qty 5

## 2020-04-21 MED ORDER — ADULT MULTIVITAMIN W/MINERALS CH
1.0000 | ORAL_TABLET | Freq: Every day | ORAL | Status: DC
  Administered 2020-04-22 – 2020-04-23 (×2): 1 via ORAL
  Filled 2020-04-21 (×2): qty 1

## 2020-04-21 MED ORDER — ONDANSETRON HCL 4 MG/2ML IJ SOLN: 4.0000 mg | Freq: Four times a day (QID) | INTRAMUSCULAR | Status: DC | PRN

## 2020-04-21 NOTE — H&P (Signed)
[facility]   PATIENT NAME: Jake Castro    MR#:  [id number]  DATE OF BIRTH:  [birth year]  DATE OF ADMISSION:  04/21/2020  PRIMARY CARE PHYSICIAN: Dorothey Baseman, MD   REQUESTING/REFERRING PHYSICIAN: Chesley Noon, MD  CHIEF COMPLAINT:   Chief Complaint  Patient presents with  . Altered Mental Status    HISTORY OF PRESENT ILLNESS:  Deral Schellenberg  is a 70 y.o. Caucasian male with a known history of multiple medical problems that are mentioned below including hypertension, dyslipidemia, coronary artery disease status post CABG, peripheral vascular disease type 2 diabetes mellitus, presented to the emergency room with a Kalisetti altered mental status with increased confusion.  The patient spent a month in [place] and on Friday has been having worsening confusion.  His wife stated that when they arrived there for the vacation and has been acting erratically.  She attributed that to poor sleep and after a couple of nights he started acting more like himself.  When she returned home about a week ago the patient stayed in [place] with his brother.  5 days ago he initially was doing fairly well but later he has noticed he has been having increasing confusion over the last couple of days.  This morning he thought he was getting ready to go back to [place] and has taken out multiple suitcases and let them out on the driveway along with his wife suppository.  His speech has been slurred at times.  He has been having abdominal pain and constipation and has taken multiple unknown medications for constipation in [place].  No chest pain or dyspnea or palpitations.  No cough or wheezing or hemoptysis.  No fever or chills.  The patient has been having dysuria without urinary frequency, urgency or flank pain.  Upon presentation to the ER, vital signs were within normal.  Labs revealed a BUN of 40 and creatinine 1.92 compared to 68 and 1.14 in 2015.  Ammonia level was 19.  High-sensitivity troponin  I was 487 lactic acid 1.6.  CBC showed leukocytosis of 12.3 with neutrophilia and anemia.  Salicylate level was less than 7 and Tylenol less than 10.  VBG showed pH 7.31 HCO3 of 29.2.  Portable chest x-ray showed increased interstitial markings compatible with pulmonary edema versus atypical infection and a focal opacity in the left lung base that may represent airspace disease or atelectasis.  Noncontrasted head CT scan revealed scattered mucosal thickening of the ethmoid sinuses with no acute intracranial findings. Abdominal pelvic CT scan revealed small gallstone and grade 2 anterolisthesis at L5-S1 secondary to bilateral L5 spondylolysis with no other acute abnormalities.  It showed aortic atherosclerosis.EKG showed no sinus rhythm rate of 95 and right bundle branch block with inferior Q waves.  Urine drug screen was positive for opiates.  UA came back negative.  COVID-19 PCR came back negative.  The patient was given 1 L bolus of IV lactated Ringer.  He will be admitted to a progressive unit bed for further evaluation and management.   PAST MEDICAL HISTORY:   Past Medical History:  Diagnosis Date  . CAD (coronary artery disease)    a. 12/2006 NSTEMI/PCI: RCA 3m/d->mid vessel stented, unable to cross dist dzs w/ balloon;  b. 11/2013 NL MV;  c. 11/2013 Echo: EF 55%;  d. 12/2013 Cath: LM 95 ost, LAD 70p, 107m, LCX 30p/m, OM1 60, RCA 20p, 38m/66m, 95d.  . Carotid arterial disease (HCC)    a. s/p LCEA  .  Diabetes mellitus (HCC)    type 2  . HTN (hypertension)   . Hyperlipidemia   . Hypertension   . Morbid obesity (HCC)   . Myocardial infarction (HCC)   . Neuromuscular disorder (HCC)    neuropathy left foot  . Neuropathy    LEFT FOOT  . Plantar fasciitis   . Pneumonia   . Wears dentures    full upper and lower partial    PAST SURGICAL HISTORY:   Past Surgical History:  Procedure Laterality Date  . CAROTID ENDARTERECTOMY    . CATARACT EXTRACTION W/PHACO Right 11/10/2015   Procedure:  CATARACT EXTRACTION PHACO AND INTRAOCULAR LENS PLACEMENT (IOC);  Surgeon: Lockie Mola, MD;  Location: [facility];  Service: Ophthalmology;  Laterality: Right;  DIABETIC-oral meds RESTOR TORIC MULTIFOCAL LENS  . CATARACT EXTRACTION W/PHACO Left 01/06/2016   Procedure: CATARACT EXTRACTION PHACO AND INTRAOCULAR LENS PLACEMENT (IOC);  Surgeon: Lockie Mola, MD;  Location: [facility];  Service: Ophthalmology;  Laterality: Left;  Lot# [id number] H US:01:12.1 AP%: 18.5 CDE: 13.36  . CORONARY ANGIOPLASTY    . CORONARY ARTERY BYPASS GRAFT N/A 01/03/2014   Procedure: CORONARY ARTERY BYPASS GRAFTING (CABG) x four, using left internal mammary and right leg greater saphenous vein harvested endoscopically;  Surgeon: Kerin Perna, MD;  Location: [facility] OR;  Service: Open Heart Surgery;  Laterality: N/A;  . FRACTURE SURGERY     ankle surgery  . REMOVE AND REPLACE LENS Right 03/01/2016   Procedure: REMOVE AND REPLACE LENS;  Surgeon: Lockie Mola, MD;  Location: [facility];  Service: Ophthalmology;  Laterality: Right;  . TEE WITHOUT CARDIOVERSION N/A 01/03/2014   Procedure: TRANSESOPHAGEAL ECHOCARDIOGRAM (TEE);  Surgeon: Kerin Perna, MD;  Location: [facility] OR;  Service: Open Heart Surgery;  Laterality: N/A;  . TONSILLECTOMY      SOCIAL HISTORY:   Social History   Tobacco Use  . Smoking status: Former Smoker    Packs/day: 0.50    Years: 1.00    Pack years: 0.50    Types: Cigarettes  . Smokeless tobacco: Never Used  . Tobacco comment: smoked for a few months in his early 20's.  Substance Use Topics  . Alcohol use: No    FAMILY HISTORY:   Family History  Problem Relation Age of Onset  . Peripheral vascular disease Father        died in his 46's - had CEA.  . Peripheral vascular disease Brother        s/p CEA  . Stroke Brother        in his 49's  . COPD Mother        died in her 46's.    DRUG ALLERGIES:   Allergies  Allergen Reactions  . Kiwi Extract  Nausea And Vomiting and Other (See Comments)    Throat bumps on throat     REVIEW OF SYSTEMS:   ROS As per history of present illness. All pertinent systems were reviewed above. Constitutional, HEENT, cardiovascular, respiratory, GI, GU, musculoskeletal, neuro, psychiatric, endocrine, integumentary and hematologic systems were reviewed and are otherwise negative/unremarkable except for positive findings mentioned above in the HPI.   MEDICATIONS AT HOME:   Prior to Admission medications   Medication Sig Start Date End Date Taking? Authorizing Provider  Ascorbic Acid (VITAMIN C) 1000 MG tablet Take 1,000 mg by mouth daily.    [provider]  aspirin 81 MG tablet Take 81 mg by mouth daily. Am    [provider]  carvedilol (COREG) 12.5  MG tablet Take 12.5 mg by mouth 2 (two) times daily with a meal.    [provider]  celecoxib (CELEBREX) 200 MG capsule Take 200 mg by mouth daily. am    [provider]  Cholecalciferol (VITAMIN D-3) 5000 UNITS TABS Take 5,000 mg by mouth daily.    [provider]  Exenatide ER (BYDUREON) 2 MG SRER Inject into the skin once a week.    [provider]  GARLIC HIGH POTENCY PO Take 932 mg by mouth daily.    [provider]  KRILL OIL PO Take 1,000 mg by mouth daily.     [provider]  l-methylfolate-B6-B12 (METANX) 3-35-2 MG TABS tablet Take 1 tablet by mouth daily. am    [provider]  lisinopril (PRINIVIL,ZESTRIL) 40 MG tablet Take 40 mg by mouth daily. am    [provider]  metFORMIN (GLUCOPHAGE) 1000 MG tablet Take 1,000 mg by mouth 2 (two) times daily with a meal. Am and bedtime    [provider]  Methylcobalamin (B12-ACTIVE) 1 MG CHEW Chew by mouth.    [provider]  Multiple Vitamin (MULTIVITAMIN) capsule Take 1 capsule by mouth daily.    [provider]  simvastatin (ZOCOR) 40 MG tablet Take 40 mg by mouth daily. am    [provider]  Specialty Vitamins Products (PROSTATE PO) Take by mouth daily. B6 10mg  and zinc 30mg     [provider]  Ubiquinol 200 MG CAPS Take by mouth daily.    [provider]  VITAMIN K PO Take 1 tablet by mouth daily.    [provider]      VITAL SIGNS:  Blood pressure 120/67, pulse 90, temperature 98 F (36.7 C), temperature source Oral, resp. rate 20, height 5\' 10"  (1.778 m), weight 119.3 kg, SpO2 98 %.  PHYSICAL EXAMINATION:  Physical Exam  GENERAL:  70 y.o.-year-old Caucasian male patient lying in the bed with no acute distress.  EYES: Pupils equal, round, reactive to light and accommodation. No scleral icterus. Extraocular muscles intact.  HEENT: Head atraumatic, normocephalic. Oropharynx and nasopharynx clear.  NECK:  Supple, no jugular venous distention. No thyroid enlargement, no tenderness.  LUNGS: Normal breath sounds bilaterally, no wheezing, rales,rhonchi or crepitation. No use of accessory muscles of respiration.  CARDIOVASCULAR: Regular rate and rhythm, S1, S2 normal. No murmurs, rubs, or gallops.  ABDOMEN: Soft, nondistended, nontender. Bowel sounds present. No organomegaly or mass.  EXTREMITIES: No pedal edema, cyanosis, or clubbing.  NEUROLOGIC: Cranial nerves II through XII are intact. Muscle strength 5/5 in all extremities. Sensation intact. Gait not checked.  PSYCHIATRIC: The patient is alert and oriented x 3.  Normal affect and good eye contact. SKIN: No obvious rash, lesion, or ulcer.   LABORATORY PANEL:   CBC Recent Labs  Lab 04/21/20 1301  WBC 12.3*  HGB 10.6*  HCT 34.6*  PLT 369   ------------------------------------------------------------------------------------------------------------------  Chemistries  Recent Labs  Lab 04/21/20 1301  NA 138  K 3.9  CL 101  CO2 26  GLUCOSE 141*  BUN 40*  CREATININE 1.92*  CALCIUM 8.9  MG 1.9  AST 28  ALT 17  ALKPHOS 37*  BILITOT 0.7    ------------------------------------------------------------------------------------------------------------------  Cardiac Enzymes No results for input(s): TROPONINI in the last 168 hours. ------------------------------------------------------------------------------------------------------------------  RADIOLOGY:  CT Head Wo Contrast  Result Date: 04/21/2020 CLINICAL DATA:  Mental status change, unknown cause. Confusion since this a.m. EXAM: CT HEAD WITHOUT CONTRAST TECHNIQUE: Contiguous axial images were  obtained from the base of the skull through the vertex without intravenous contrast. COMPARISON:  MRI brain February 11, 2010 and head CT February 07, 2010. FINDINGS: Brain: No evidence of acute infarction, hemorrhage, hydrocephalus, extra-axial collection or mass lesion/mass effect. Vascular: No hyperdense vessel or unexpected calcification. Atherosclerotic calcifications of the internal carotid and vertebral arteries. Skull: Normal. Negative for fracture or focal lesion. Sinuses/Orbits: Scattered mucosal thickening of the ethmoid sinuses. Other: None. IMPRESSION: 1. No acute intracranial findings. 2. Scattered mucosal thickening of the ethmoid sinuses. Electronically Signed   By: Maudry MayhewJeffrey  Waltz MD   On: 04/21/2020 13:49   CT Abdomen Pelvis W Contrast  Result Date: 04/21/2020 CLINICAL DATA:  Acute generalized abdominal pain. EXAM: CT ABDOMEN AND PELVIS WITH CONTRAST TECHNIQUE: Multidetector CT imaging of the abdomen and pelvis was performed using the standard protocol following bolus administration of intravenous contrast. CONTRAST:  75mL OMNIPAQUE IOHEXOL 300 MG/ML  SOLN COMPARISON:  None. FINDINGS: Lower chest: No acute abnormality. Hepatobiliary: Small gallstone is noted. No biliary dilatation is noted. The liver is unremarkable. Pancreas: Unremarkable. No pancreatic ductal dilatation or surrounding inflammatory changes. Spleen: Normal in size without focal abnormality. Adrenals/Urinary Tract:  Adrenal glands appear normal. Small right renal cyst is noted. No hydronephrosis or renal obstruction is noted. No renal or ureteral calculi are noted. Urinary bladder is unremarkable. Stomach/Bowel: Stomach is within normal limits. Appendix appears normal. No evidence of bowel wall thickening, distention, or inflammatory changes. Vascular/Lymphatic: Aortic atherosclerosis. No enlarged abdominal or pelvic lymph nodes. Reproductive: Prostate is unremarkable. Other: No abdominal wall hernia or abnormality. No abdominopelvic ascites. Musculoskeletal: Grade 2 anterolisthesis of L5-S1 is noted secondary to bilateral L5 spondylolysis. IMPRESSION: 1. Small gallstone. 2. Grade 2 anterolisthesis of L5-S1 secondary to bilateral L5 spondylolysis. 3. No acute abnormality seen in the abdomen or pelvis. 4. Aortic atherosclerosis. Aortic Atherosclerosis (ICD10-I70.0). Electronically Signed   By: Lupita RaiderJames  Green Jr M.D.   On: 04/21/2020 14:50   DG Chest Portable 1 View  Result Date: 04/21/2020 CLINICAL DATA:  Altered mental status. Progressive worsening of symptoms since Friday night EXAM: PORTABLE CHEST 1 VIEW COMPARISON:  02/04/2014 FINDINGS: Previous median sternotomy and CABG procedure no pleural effusion. Mild diffuse increase interstitial markings with a focal airspace opacity in the left base. The visualized osseous structures are notable for bilateral AC joint osteoarthritis. Curvature of the thoracic spine is convex towards the right. IMPRESSION: 1. Increase interstitial markings compatible with pulmonary edema versus atypical infection. 2. A focal opacity is noted in the left base which may represent airspace disease or atelectasis. Electronically Signed   By: Signa Kellaylor  Stroud M.D.   On: 04/21/2020 14:19      IMPRESSION AND PLAN:   1.  Acute encephalopathy of questionable etiology, rule out acute frontal CVA. -The patient will be admitted to the progressive unit bed. -We will follow neuro checks every 4 hours for  24 hours. -We will obtain a brain MRI without contrast.  2.  Elevated troponin I, rule out acute coronary syndrome with history of coronary artery disease status post CABG.  This could be contributing to altered mental status and encephalopathy. -We will follow serial troponin I's. -Cardiology consultation will be obtained. -Dr. Welton FlakesKhan was notified about the patient. -We will continue aspirin and beta-blocker therapy as well as statin therapy. -We will continue IV heparin for now. -We will obtain a D-dimer and if elevated will check VQ scan to rule out PE.  3.  Acute kidney injury. -The patient will be hydrated with IV  normal saline and will follow BMP. -We will avoid nephrotoxins.  4.  Dyslipidemia. -We will continue statin therapy as well as TriCor.  4.  Type 2 diabetes mellitus. -We will place the patient on supplemental coverage with NovoLog and continue basal coverage. -We will hold off Metformin.  5.  Essential hypertension. -We will hold off lisinopril given acute kidney injury -We will continue Coreg.  6.  Anxiety. -We will continue as needed Xanax.  7.  DVT prophylaxis. -Patient will be on IV heparin.   All the records are reviewed and case discussed with ED provider. The plan of care was discussed in details with the patient (and family). I answered all questions. The patient agreed to proceed with the above mentioned plan. Further management will depend upon hospital course.   CODE STATUS: Full code  Status is: Inpatient  Remains inpatient appropriate because:Ongoing diagnostic testing needed not appropriate for outpatient work up, Unsafe d/c plan, IV treatments appropriate due to intensity of illness or inability to take PO and Inpatient level of care appropriate due to severity of illness   Dispo: The patient is from: Home              Anticipated d/c is to: Home              Anticipated d/c date is: 2 days              Patient currently is not medically  stable to d/c.   Difficult to place patient No  TOTAL TIME TAKING CARE OF THIS PATIENT: 55 minutes.    Hannah Beat M.D on 04/21/2020 at 3:35 PM  [facility]   From 7 PM-7 AM, contact night-coverage [email or web address]  CC: Primary care physician; Dorothey Baseman, MD

## 2020-04-21 NOTE — ED Triage Notes (Signed)
Pt states awoke this morning at approx 1030 and was confused thinking he was going to [place]. Pt states he had a class this evening but the times got switched but then he felt he had to go to [place], pt states he was very confused because he just came back from [place], pt states he then began packing from [place] and packed "all kinds of crazy stuff for [facility]".  Pt's wife states she arrived home and the driveway was lined with bags, suitcases, pillow cases full of personal belongings and innappropriate things should patient have been packing for [place]. Pt states some fuzziness when touched, pt noted to ramble when telling the story, no aphasia noted, pt's wife reports slurred speech.   Pt's wife reports symptoms have been progressively worsening since Friday night after traveling all night and since then have been progressively worsening. Pt's wife reports slurred speech started Monday, confusion started Sunday and has progressively worsened.

## 2020-04-21 NOTE — Consult Note (Signed)
Jake Castro is a 70 y.o. male  [id number]  Primary Cardiologist: KC. Not seen for > 3years Reason for Consultation: NSTEMI  HPI: Patient is a 70 year old male with extensive cardiac history including CAD status post PCI and CABG in 2008, hypertension, hyperlipidemia, and DMII presented to the emergency department with altered mental status.  Per the patient's wife patient has had worsening confusion and acting erratically for the past month.  She states he has also had slurred speech recently.  We have been consulted for NSTEMI with troponins greater than 400. Patient's last ischemic work up in 2015 with a negative stress test and echo with normal LVEF.   Review of Systems: Patient not in his room   Past Medical History:  Diagnosis Date  . CAD (coronary artery disease)    a. 12/2006 NSTEMI/PCI: RCA 73m/d->mid vessel stented, unable to cross dist dzs w/ balloon;  b. 11/2013 NL MV;  c. 11/2013 Echo: EF 55%;  d. 12/2013 Cath: LM 95 ost, LAD 70p, 97m, LCX 30p/m, OM1 60, RCA 20p, 18m/15m, 95d.  . Carotid arterial disease (HCC)    a. s/p LCEA  . Diabetes mellitus (HCC)    type 2  . HTN (hypertension)   . Hyperlipidemia   . Hypertension   . Morbid obesity (HCC)   . Myocardial infarction (HCC)   . Neuromuscular disorder (HCC)    neuropathy left foot  . Neuropathy    LEFT FOOT  . Plantar fasciitis   . Pneumonia   . Wears dentures    full upper and lower partial    (Not in a hospital admission)    . aspirin EC  81 mg Oral Daily    Infusions:   Allergies  Allergen Reactions  . Kiwi Extract Nausea And Vomiting and Other (See Comments)    Throat bumps on throat     Social History   Socioeconomic History  . Marital status: Married    Spouse name: Not on file  . Number of children: Not on file  . Years of education: Not on file  . Highest education level: Not on file  Occupational History  . Not on file  Tobacco Use  . Smoking status: Former Smoker    Packs/day:  0.50    Years: 1.00    Pack years: 0.50    Types: Cigarettes  . Smokeless tobacco: Never Used  . Tobacco comment: smoked for a few months in his early 20's.  Substance and Sexual Activity  . Alcohol use: No  . Drug use: No    Types: Marijuana    Comment: Previously used illicit drugs.  None in > 15 yrs.  . Sexual activity: Not on file  Other Topics Concern  . Not on file  Social History Narrative   Lives in [place] with his wife.  Was not routinely exercising.   Social Determinants of Health   Financial Resource Strain: Not on file  Food Insecurity: Not on file  Transportation Needs: Not on file  Physical Activity: Not on file  Stress: Not on file  Social Connections: Not on file  Intimate Partner Violence: Not on file    Family History  Problem Relation Age of Onset  . Peripheral vascular disease Father        died in his 64's - had CEA.  . Peripheral vascular disease Brother        s/p CEA  . Stroke Brother        in his 57's  .  COPD Mother        died in her 80's.    PHYSICAL EXAM: Vitals:   04/21/20 1251  BP: 120/67  Pulse: 90  Resp: 20  Temp: 98 F (36.7 C)  SpO2: 98%    No intake or output data in the 24 hours ending 04/21/20 1432  Patient has not been assessed as he is not in his room.  Patient is current weight off before getting a CT abdomen.  ECG: NSR with RBBB and evidence of inferior ischemia.  95/bpm Unchanged from previous EKG in 2015.  Results for orders placed or performed during the hospital encounter of 04/21/20 (from the past 24 hour(s))  CBC with Differential/Platelet     Status: Abnormal   Collection Time: 04/21/20  1:01 PM  Result Value Ref Range   WBC 12.3 (H) 4.0 - 10.5 K/uL   RBC 3.77 (L) 4.22 - 5.81 MIL/uL   Hemoglobin 10.6 (L) 13.0 - 17.0 g/dL   HCT 83.3 (L) 82.5 - 05.3 %   MCV 91.8 80.0 - 100.0 fL   MCH 28.1 26.0 - 34.0 pg   MCHC 30.6 30.0 - 36.0 g/dL   RDW 97.6 73.4 - 19.3 %   Platelets 369 150 - 400 K/uL   nRBC  0.0 0.0 - 0.2 %   Neutrophils Relative % 73 %   Neutro Abs 8.9 (H) 1.7 - 7.7 K/uL   Lymphocytes Relative 14 %   Lymphs Abs 1.7 0.7 - 4.0 K/uL   Monocytes Relative 7 %   Monocytes Absolute 0.9 0.1 - 1.0 K/uL   Eosinophils Relative 5 %   Eosinophils Absolute 0.6 (H) 0.0 - 0.5 K/uL   Basophils Relative 0 %   Basophils Absolute 0.0 0.0 - 0.1 K/uL   Immature Granulocytes 1 %   Abs Immature Granulocytes 0.08 (H) 0.00 - 0.07 K/uL  Comprehensive metabolic panel     Status: Abnormal   Collection Time: 04/21/20  1:01 PM  Result Value Ref Range   Sodium 138 135 - 145 mmol/L   Potassium 3.9 3.5 - 5.1 mmol/L   Chloride 101 98 - 111 mmol/L   CO2 26 22 - 32 mmol/L   Glucose, Bld 141 (H) 70 - 99 mg/dL   BUN 40 (H) 8 - 23 mg/dL   Creatinine, Ser 7.90 (H) 0.61 - 1.24 mg/dL   Calcium 8.9 8.9 - 24.0 mg/dL   Total Protein 7.7 6.5 - 8.1 g/dL   Albumin 3.7 3.5 - 5.0 g/dL   AST 28 15 - 41 U/L   ALT 17 0 - 44 U/L   Alkaline Phosphatase 37 (L) 38 - 126 U/L   Total Bilirubin 0.7 0.3 - 1.2 mg/dL   GFR, Estimated 37 (L) >60 mL/min   Anion gap 11 5 - 15  Magnesium     Status: None   Collection Time: 04/21/20  1:01 PM  Result Value Ref Range   Magnesium 1.9 1.7 - 2.4 mg/dL  Salicylate level     Status: Abnormal   Collection Time: 04/21/20  1:01 PM  Result Value Ref Range   Salicylate Lvl <7.0 (L) 7.0 - 30.0 mg/dL  Acetaminophen level     Status: Abnormal   Collection Time: 04/21/20  1:01 PM  Result Value Ref Range   Acetaminophen (Tylenol), Serum <10 (L) 10 - 30 ug/mL  Troponin I (High Sensitivity)     Status: Abnormal   Collection Time: 04/21/20  1:01 PM  Result Value Ref Range   Troponin  I (High Sensitivity) 487 (HH) <18 ng/L  Lactic acid, plasma     Status: None   Collection Time: 04/21/20  1:02 PM  Result Value Ref Range   Lactic Acid, Venous 1.6 0.5 - 1.9 mmol/L  Ammonia     Status: None   Collection Time: 04/21/20  1:03 PM  Result Value Ref Range   Ammonia 19 9 - 35 umol/L  Blood gas,  venous     Status: Abnormal   Collection Time: 04/21/20  1:58 PM  Result Value Ref Range   FIO2 ROOM AIR    pH, Ven 7.31 7.250 - 7.430   pCO2, Ven 58 44.0 - 60.0 mmHg   pO2, Ven 35.0 32.0 - 45.0 mmHg   Bicarbonate 29.2 (H) 20.0 - 28.0 mmol/L   Acid-Base Excess 1.9 0.0 - 2.0 mmol/L   O2 Saturation 60.8 %   Patient temperature 37.0    Collection site VENOUS    Sample type VENOUS    CT Head Wo Contrast  Result Date: 04/21/2020 CLINICAL DATA:  Mental status change, unknown cause. Confusion since this a.m. EXAM: CT HEAD WITHOUT CONTRAST TECHNIQUE: Contiguous axial images were obtained from the base of the skull through the vertex without intravenous contrast. COMPARISON:  MRI brain February 11, 2010 and head CT February 07, 2010. FINDINGS: Brain: No evidence of acute infarction, hemorrhage, hydrocephalus, extra-axial collection or mass lesion/mass effect. Vascular: No hyperdense vessel or unexpected calcification. Atherosclerotic calcifications of the internal carotid and vertebral arteries. Skull: Normal. Negative for fracture or focal lesion. Sinuses/Orbits: Scattered mucosal thickening of the ethmoid sinuses. Other: None. IMPRESSION: 1. No acute intracranial findings. 2. Scattered mucosal thickening of the ethmoid sinuses. Electronically Signed   By: Maudry Mayhew MD   On: 04/21/2020 13:49   DG Chest Portable 1 View  Result Date: 04/21/2020 CLINICAL DATA:  Altered mental status. Progressive worsening of symptoms since Friday night EXAM: PORTABLE CHEST 1 VIEW COMPARISON:  02/04/2014 FINDINGS: Previous median sternotomy and CABG procedure no pleural effusion. Mild diffuse increase interstitial markings with a focal airspace opacity in the left base. The visualized osseous structures are notable for bilateral AC joint osteoarthritis. Curvature of the thoracic spine is convex towards the right. IMPRESSION: 1. Increase interstitial markings compatible with pulmonary edema versus atypical infection. 2. A  focal opacity is noted in the left base which may represent airspace disease or atelectasis. Electronically Signed   By: Signa Kell M.D.   On: 04/21/2020 14:19     ASSESSMENT AND PLAN: Patient presented to the emergency department with worsening confusion and found to have NSTEMI.  CT head is negative. With patient's extensive cardiac history and elevated troponins will initiate heparin infusion and plan on cardiac catheterization. Will also plan on echocardiogram. Will continue to follow  Maryelizabeth Kaufmann NP-C

## 2020-04-21 NOTE — Progress Notes (Signed)
Received patient running Heparin gtt at 71ml/hr

## 2020-04-21 NOTE — ED Provider Notes (Signed)
[facility] Emergency Department Provider Note   ____________________________________________   Event Date/Time   First MD Initiated Contact with Patient 04/21/20 1308     (approximate)  I have reviewed the triage vital signs and the nursing notes.   HISTORY  Chief Complaint Altered Mental Status    HPI BARNEY RUSSOMANNO is a 70 y.o. male with past medical history of hypertension, hyperlipidemia, diabetes, CAD status post CABG who presents to the ED for altered mental status.  History is limited due to patient's confusion and majority of history is obtained from his wife.  She states that she first noticed him acting erratically about 1 month ago when they arrived to [place] for vacation.  She attributed this to poor sleep and after patient got better sleep for a couple of nights he started acting more like himself.  She then returned home to [place] about 1 week ago, when the patient stayed in [place] with his brother.  He returned home 5 days ago and initially seemed okay, but wife has since noticed increasing confusion over the past couple of days.  He has displayed erratic behavior and when she returned home this morning, he thought he was getting ready to go back to [place].  He had taken out multiple suitcases and laid them out on the driveway along with his wife's pottery.  Wife also reports his speech has been slurred at times.  He complains of some abdominal pain and constipation and did take multiple unknown medications for constipation while in [place].  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        Past Medical History:  Diagnosis Date  . CAD (coronary artery disease)    a. 12/2006 NSTEMI/PCI: RCA 84m/d->mid vessel stented, unable to cross dist dzs w/ balloon;  b. 11/2013 NL MV;  c. 11/2013 Echo: EF 55%;  d. 12/2013 Cath: LM 95  ost, LAD 70p, 72m, LCX 30p/m, OM1 60, RCA 20p, 84m/77m, 95d.  . Carotid arterial disease (HCC)    a. s/p LCEA  . Diabetes mellitus (HCC)    type 2  . HTN (hypertension)   . Hyperlipidemia   . Hypertension   . Morbid obesity (HCC)   . Myocardial infarction (HCC)   . Neuromuscular disorder (HCC)    neuropathy left foot  . Neuropathy    LEFT FOOT  . Plantar fasciitis   . Pneumonia   . Wears dentures    full upper and lower partial    Patient Active Problem List   Diagnosis Date Noted  . Acute encephalopathy 04/21/2020  . Cellulitis, wound, post-operative 01/21/2014  . Diabetes mellitus (HCC) 01/16/2014  . S/P CABG x 4 01/03/2014  . Unstable angina (HCC) 01/02/2014    Past Surgical History:  Procedure Laterality Date  . CAROTID ENDARTERECTOMY    . CATARACT EXTRACTION W/PHACO Right 11/10/2015   Procedure: CATARACT EXTRACTION PHACO AND INTRAOCULAR LENS PLACEMENT (IOC);  Surgeon: Lockie Mola, MD;  Location: [facility];  Service: Ophthalmology;  Laterality: Right;  DIABETIC-oral meds RESTOR TORIC MULTIFOCAL LENS  . CATARACT EXTRACTION W/PHACO Left 01/06/2016   Procedure: CATARACT EXTRACTION PHACO AND INTRAOCULAR LENS PLACEMENT (IOC);  Surgeon: Lockie Mola, MD;  Location: [facility] ORS;  Service: Ophthalmology;  Laterality: Left;  Lot# [id number] H US:01:12.1 AP%: 18.5 CDE: 13.36  . CORONARY ANGIOPLASTY    . CORONARY ARTERY  BYPASS GRAFT N/A 01/03/2014   Procedure: CORONARY ARTERY BYPASS GRAFTING (CABG) x four, using left internal mammary and right leg greater saphenous vein harvested endoscopically;  Surgeon: Kerin Perna, MD;  Location: [facility] OR;  Service: Open Heart Surgery;  Laterality: N/A;  . FRACTURE SURGERY     ankle surgery  . REMOVE AND REPLACE LENS Right 03/01/2016   Procedure: REMOVE AND REPLACE LENS;  Surgeon: Lockie Mola, MD;  Location: [facility];  Service: Ophthalmology;  Laterality: Right;  . TEE WITHOUT CARDIOVERSION N/A  01/03/2014   Procedure: TRANSESOPHAGEAL ECHOCARDIOGRAM (TEE);  Surgeon: Kerin Perna, MD;  Location: [facility] OR;  Service: Open Heart Surgery;  Laterality: N/A;  . TONSILLECTOMY      Prior to Admission medications   Medication Sig Start Date End Date Taking? Authorizing Provider  Ascorbic Acid (VITAMIN C) 1000 MG tablet Take 1,000 mg by mouth daily.    [provider]  aspirin 81 MG tablet Take 81 mg by mouth daily. Am    [provider]  carvedilol (COREG) 12.5 MG tablet Take 12.5 mg by mouth 2 (two) times daily with a meal.    [provider]  celecoxib (CELEBREX) 200 MG capsule Take 200 mg by mouth daily. am    [provider]  Cholecalciferol (VITAMIN D-3) 5000 UNITS TABS Take 5,000 mg by mouth daily.    [provider]  Exenatide ER (BYDUREON) 2 MG SRER Inject into the skin once a week.    [provider]  GARLIC HIGH POTENCY PO Take 163 mg by mouth daily.    [provider]  KRILL OIL PO Take 1,000 mg by mouth daily.     [provider]  l-methylfolate-B6-B12 (METANX) 3-35-2 MG TABS tablet Take 1 tablet by mouth daily. am    [provider]  lisinopril (PRINIVIL,ZESTRIL) 40 MG tablet Take 40 mg by mouth daily. am    [provider]  metFORMIN (GLUCOPHAGE) 1000 MG tablet Take 1,000 mg by mouth 2 (two) times daily with a meal. Am and bedtime    [provider]  Methylcobalamin (B12-ACTIVE) 1 MG CHEW Chew by mouth.    [provider]  Multiple Vitamin (MULTIVITAMIN) capsule Take 1 capsule by mouth daily.    [provider]  simvastatin (ZOCOR) 40 MG tablet Take 40 mg by mouth daily. am    [provider]  Specialty Vitamins Products (PROSTATE PO) Take by mouth daily. B6 10mg  and zinc 30mg     [provider]  Ubiquinol 200 MG CAPS Take by mouth daily.    [provider]  VITAMIN K PO Take 1 tablet by mouth daily.    [provider]     Allergies Kiwi extract  Family History  Problem Relation Age of Onset  . Peripheral vascular disease Father        died in his 29's - had CEA.  . Peripheral vascular disease Brother        s/p CEA  . Stroke Brother        in his 16's  . COPD Mother        died in her 54's.    Social History Social History   Tobacco Use  . Smoking status: Former Smoker    Packs/day: 0.50    Years: 1.00    Pack years: 0.50    Types: Cigarettes  . Smokeless tobacco: Never Used  . Tobacco comment: smoked for a few months in his early 20's.  Substance Use Topics  . Alcohol use: No  . Drug use: No    Types: Marijuana    Comment: Previously used illicit drugs.  None in > 15 yrs.    Review of Systems  Constitutional: No fever/chills.  Positive for confusion. Eyes: No visual changes. ENT: No sore throat. Cardiovascular: Denies chest pain. Respiratory: Denies shortness of breath. Gastrointestinal: Positive for abdominal pain.  No nausea, no vomiting.  No diarrhea.  Positive for constipation. Genitourinary: Negative for dysuria. Musculoskeletal: Negative for back pain. Skin: Negative for rash. Neurological: Negative for headaches, focal weakness or numbness.  ____________________________________________   PHYSICAL EXAM:  VITAL SIGNS: ED Triage Vitals  Enc Vitals Group     BP 04/21/20 1251 120/67     Pulse Rate 04/21/20 1251 90     Resp 04/21/20 1251 20     Temp 04/21/20 1251 98 F (36.7 C)     Temp Source 04/21/20 1251 Oral     SpO2 04/21/20 1251 98 %     Weight 04/21/20 1256 263 lb 0.1 oz (119.3 kg)     Height 04/21/20 1256 5\' 10"  (1.778 m)     Head Circumference --      Peak Flow --      Pain Score 04/21/20 1255 0     Pain Loc --      Pain Edu? --      Excl. in GC? --     Constitutional: Alert and oriented. Eyes: Conjunctivae are normal.  Pupils equal round and reactive to light bilaterally. Head: Atraumatic. Nose: No congestion/rhinnorhea. Mouth/Throat: Mucous  membranes are moist. Neck: Normal ROM Cardiovascular: Normal rate, regular rhythm. Grossly normal heart sounds. Respiratory: Normal respiratory effort.  No retractions. Lungs CTAB. Gastrointestinal: Soft and diffusely tender to palpation with no rebound or guarding. No distention. Genitourinary: deferred Musculoskeletal: No lower extremity tenderness nor edema. Neurologic:  Normal speech and language. No gross focal neurologic deficits are appreciated. Skin:  Skin is warm, dry and intact. No rash noted. Psychiatric: Mood and affect are normal. Speech and behavior are normal.  ____________________________________________   LABS (all labs ordered are listed, but only abnormal results are displayed)  Labs Reviewed  CBC WITH DIFFERENTIAL/PLATELET - Abnormal; Notable for the following components:      Result Value   WBC 12.3 (*)    RBC 3.77 (*)    Hemoglobin 10.6 (*)    HCT 34.6 (*)    Neutro Abs 8.9 (*)    Eosinophils Absolute 0.6 (*)    Abs Immature Granulocytes 0.08 (*)    All other components within normal limits  COMPREHENSIVE METABOLIC PANEL - Abnormal; Notable for the following components:   Glucose, Bld 141 (*)    BUN 40 (*)    Creatinine, Ser 1.92 (*)    Alkaline Phosphatase 37 (*)    GFR, Estimated 37 (*)    All other components within normal limits  SALICYLATE LEVEL - Abnormal; Notable for the following components:   Salicylate Lvl <7.0 (*)    All other components within normal limits  ACETAMINOPHEN LEVEL - Abnormal; Notable for the following components:   Acetaminophen (Tylenol), Serum <10 (*)    All other components within normal limits  BLOOD GAS, VENOUS - Abnormal; Notable for the following components:   Bicarbonate 29.2 (*)    All other components within normal limits  TROPONIN I (HIGH SENSITIVITY) - Abnormal; Notable for the following components:   Troponin I (High Sensitivity) 487 (*)    All other  components within normal limits  SARS CORONAVIRUS 2 BY RT PCR  (HOSPITAL ORDER, PERFORMED IN [facility])  MAGNESIUM  LACTIC ACID, PLASMA  AMMONIA  URINE DRUG SCREEN, QUALITATIVE ([facility] ONLY)  URINALYSIS, COMPLETE (UACMP) WITH MICROSCOPIC  BRAIN NATRIURETIC PEPTIDE  FIBRIN DERIVATIVES D-DIMER ([facility] ONLY)  TROPONIN I (HIGH SENSITIVITY)   ____________________________________________  EKG  ED ECG REPORT I, Chesley Noon, the attending physician, personally viewed and interpreted this ECG.   Date: 04/21/2020  EKG Time: 12:50  Rate: 95  Rhythm: normal sinus rhythm  Axis: Normal  Intervals:RBBB  ST&T Change: None   PROCEDURES  Procedure(s) performed (including Critical Care):  Procedures   ____________________________________________   INITIAL IMPRESSION / ASSESSMENT AND PLAN / ED COURSE       70 year old male with past medical history of hypertension, hyperlipidemia, diabetes, and CAD status post CABG who presents to the ED for altered mental status and increased confusion over the past couple of days.  Patient appears disoriented on my evaluation but is of a potentially able to answer orientation questions appropriately, no focal deficits noted on exam.  He does complain of some abdominal pain with diffuse tenderness which we will further assess with CT scan.  CT head is negative for acute process.  Screening labs thus far reveal elevated troponin of greater than 400, although patient denies any chest pain or shortness of breath.  EKG shows no ischemic changes and case discussed with Dr. Welton Flakes of cardiology.  We will hold off on heparin drip for now, plan for admission to hospitalist once remainder of work-up is completed.  Case again discussed with cardiology, who will now plan to start patient on heparin with tentative plan for catheterization tomorrow.  Patient continues to deny any chest pain or shortness of breath.  CT abdomen/pelvis is negative for acute process.  Remainder of work-up outside of his elevated troponin is  unremarkable.  Testing for COVID-19 is pending.  Case discussed with hospitalist for admission.      ____________________________________________   FINAL CLINICAL IMPRESSION(S) / ED DIAGNOSES  Final diagnoses:  Altered mental status, unspecified altered mental status type  NSTEMI (non-ST elevated myocardial infarction) [facility])     ED Discharge Orders    None       Note:  This document was prepared using Dragon voice recognition software and may include unintentional dictation errors.   Chesley Noon, MD 04/21/20 [phone]

## 2020-04-21 NOTE — ED Notes (Signed)
meds infusing  Pt alert

## 2020-04-21 NOTE — ED Notes (Signed)
Pt ambulated with standby assist to the restroom 

## 2020-04-21 NOTE — ED Notes (Signed)
Pt ambulated to and from the toilet with stand by assist.

## 2020-04-21 NOTE — Consult Note (Signed)
ANTICOAGULATION CONSULT NOTE  Pharmacy Consult for Heparin Infusion Indication: chest pain/ACS  Patient Measurements: Heparin Dosing Weight: 99.7 kg  Labs: Recent Labs    04/21/20 1301  HGB 10.6*  HCT 34.6*  PLT 369  CREATININE 1.92*  TROPONINIHS 487*    Estimated Creatinine Clearance: 45.7 mL/min (A) (by C-G formula based on SCr of 1.92 mg/dL (H)).   Medical History: Past Medical History:  Diagnosis Date  . CAD (coronary artery disease)    a. 12/2006 NSTEMI/PCI: RCA 61m/d->mid vessel stented, unable to cross dist dzs w/ balloon;  b. 11/2013 NL MV;  c. 11/2013 Echo: EF 55%;  d. 12/2013 Cath: LM 95 ost, LAD 70p, 61m, LCX 30p/m, OM1 60, RCA 20p, 14m/3m, 95d.  . Carotid arterial disease (HCC)    a. s/p LCEA  . Diabetes mellitus (HCC)    type 2  . HTN (hypertension)   . Hyperlipidemia   . Hypertension   . Morbid obesity (HCC)   . Myocardial infarction (HCC)   . Neuromuscular disorder (HCC)    neuropathy left foot  . Neuropathy    LEFT FOOT  . Plantar fasciitis   . Pneumonia   . Wears dentures    full upper and lower partial    Medications:  No anticoagulation prior to admission per my chart review  Assessment: Patient is a 70 y/o M with medical history as above including CAD s/p CABG who presented to the ED 2/2 with altered mental status. Troponin elevated to 487. Patient ultimately found to have NSTEMI. Pharmacy has been consulted to initiate heparin infusion for ACS.  Baseline Hgb 10.6, platelets 369. Baseline aPTT and PT-INR pending.   Goal of Therapy:  Heparin level 0.3-0.7 units/ml Monitor platelets by anticoagulation protocol: Yes   Plan:  --Heparin 4000 unit IV bolus x 1 followed by continuous infusion at 1200 units/hr --Heparin level 8 hours after initiation of infusion --Daily CBC per protocol while on heparin infusion  Tressie Ellis 04/21/2020,3:34 PM

## 2020-04-22 ENCOUNTER — Encounter
Admission: EM | Disposition: A | Discharge status: Home / Self Care | Payer: Self-pay | Source: Home / Self Care | Attending: Internal Medicine

## 2020-04-22 DIAGNOSIS — N179 Acute kidney failure, unspecified: Secondary | ICD-10-CM | POA: Diagnosis present

## 2020-04-22 DIAGNOSIS — G9340 Encephalopathy, unspecified: Secondary | ICD-10-CM

## 2020-04-22 HISTORY — PX: LEFT HEART CATH AND CORS/GRAFTS ANGIOGRAPHY: CATH118250

## 2020-04-22 LAB — BASIC METABOLIC PANEL
Anion gap: 9 (ref 5–15)
Anion gap: 11 (ref 5–15)
BUN: 29 mg/dL — ABNORMAL HIGH (ref 8–23)
BUN: 18 mg/dL (ref 8–23)
CO2: 29 mmol/L (ref 22–32)
CO2: 25 mmol/L (ref 22–32)
Calcium: 9.1 mg/dL (ref 8.9–10.3)
Calcium: 9 mg/dL (ref 8.9–10.3)
Chloride: 104 mmol/L (ref 98–111)
Chloride: 105 mmol/L (ref 98–111)
Creatinine, Ser: 1.36 mg/dL — ABNORMAL HIGH (ref 0.61–1.24)
Creatinine, Ser: 1.12 mg/dL (ref 0.61–1.24)
GFR, Estimated: 56 mL/min — ABNORMAL LOW (ref >60)
GFR, Estimated: >60 mL/min (ref >60)
Glucose, Bld: 106 mg/dL — ABNORMAL HIGH (ref 70–99)
Glucose, Bld: 126 mg/dL — ABNORMAL HIGH (ref 70–99)
Potassium: 3.4 mmol/L — ABNORMAL LOW (ref 3.5–5.1)
Potassium: 3.5 mmol/L (ref 3.5–5.1)
Sodium: 142 mmol/L (ref 135–145)
Sodium: 141 mmol/L (ref 135–145)

## 2020-04-22 LAB — CBC
HCT: 31.3 % — ABNORMAL LOW (ref 39.0–52.0)
HCT: 32.4 % — ABNORMAL LOW (ref 39.0–52.0)
Hemoglobin: 10.1 g/dL — ABNORMAL LOW (ref 13.0–17.0)
Hemoglobin: 10.6 g/dL — ABNORMAL LOW (ref 13.0–17.0)
MCH: 28.8 pg (ref 26.0–34.0)
MCH: 28.7 pg (ref 26.0–34.0)
MCHC: 32.3 g/dL (ref 30.0–36.0)
MCHC: 32.7 g/dL (ref 30.0–36.0)
MCV: 89.2 fL (ref 80.0–100.0)
MCV: 87.8 fL (ref 80.0–100.0)
Platelets: 337 10*3/uL (ref 150–400)
Platelets: 345 10*3/uL (ref 150–400)
RBC: 3.51 MIL/uL — ABNORMAL LOW (ref 4.22–5.81)
RBC: 3.69 MIL/uL — ABNORMAL LOW (ref 4.22–5.81)
RDW: 14.5 % (ref 11.5–15.5)
RDW: 14.2 % (ref 11.5–15.5)
WBC: 9.5 10*3/uL (ref 4.0–10.5)
WBC: 8.2 10*3/uL (ref 4.0–10.5)
nRBC: 0 % (ref 0.0–0.2)
nRBC: 0 % (ref 0.0–0.2)

## 2020-04-22 LAB — LIPID PANEL
Cholesterol: 121 mg/dL (ref 0–200)
HDL: 46 mg/dL (ref >40)
LDL Cholesterol: 46 mg/dL (ref 0–99)
Total CHOL/HDL Ratio: 2.6 RATIO
Triglycerides: 146 mg/dL (ref <150)
VLDL: 29 mg/dL (ref 0–40)

## 2020-04-22 LAB — ECHOCARDIOGRAM COMPLETE
AR max vel: 1.91 cm2
AV Area VTI: 2.23 cm2
AV Area mean vel: 2.09 cm2
AV Mean grad: 10.3 mmHg
AV Peak grad: 17.9 mmHg
Ao pk vel: 2.12 m/s
Area-P 1/2: 3.08 cm2
Height: 70 in
S' Lateral: 3.07 cm
Weight: 4208.14 oz

## 2020-04-22 LAB — GLUCOSE, CAPILLARY
Glucose-Capillary: 92 mg/dL (ref 70–99)
Glucose-Capillary: 145 mg/dL — ABNORMAL HIGH (ref 70–99)
Glucose-Capillary: 93 mg/dL (ref 70–99)
Glucose-Capillary: 163 mg/dL — ABNORMAL HIGH (ref 70–99)

## 2020-04-22 LAB — HEPARIN LEVEL (UNFRACTIONATED)
Heparin Unfractionated: <0.1 IU/mL — ABNORMAL LOW (ref 0.30–0.70)
Heparin Unfractionated: 0.3 IU/mL (ref 0.30–0.70)
Heparin Unfractionated: <0.1 IU/mL — ABNORMAL LOW (ref 0.30–0.70)

## 2020-04-22 LAB — PROTIME-INR
INR: 1.1 (ref 0.8–1.2)
Prothrombin Time: 13.8 seconds (ref 11.4–15.2)

## 2020-04-22 LAB — HEMOGLOBIN A1C
Hgb A1c MFr Bld: 6.7 % — ABNORMAL HIGH (ref 4.8–5.6)
Mean Plasma Glucose: 145.59 mg/dL

## 2020-04-22 SURGERY — LEFT HEART CATH AND CORS/GRAFTS ANGIOGRAPHY
Anesthesia: Moderate Sedation

## 2020-04-22 MED ORDER — MIDAZOLAM HCL 2 MG/2ML IJ SOLN
INTRAMUSCULAR | Status: AC
  Filled 2020-04-22: qty 2

## 2020-04-22 MED ORDER — ASPIRIN EC 81 MG PO TBEC
81.0000 mg | DELAYED_RELEASE_TABLET | Freq: Every day | ORAL | Status: DC
  Administered 2020-04-23: 81 mg via ORAL
  Filled 2020-04-22: qty 1

## 2020-04-22 MED ORDER — LIDOCAINE HCL (PF) 1 % IJ SOLN
INTRAMUSCULAR | Status: DC | PRN
  Administered 2020-04-22: 20 mL

## 2020-04-22 MED ORDER — HYDRALAZINE HCL 20 MG/ML IJ SOLN: 10.0000 mg | INTRAMUSCULAR | Status: AC | PRN

## 2020-04-22 MED ORDER — LIDOCAINE HCL (PF) 1 % IJ SOLN
INTRAMUSCULAR | Status: AC
  Filled 2020-04-22: qty 30

## 2020-04-22 MED ORDER — ONDANSETRON HCL 4 MG/2ML IJ SOLN: 4.0000 mg | Freq: Four times a day (QID) | INTRAMUSCULAR | Status: DC | PRN

## 2020-04-22 MED ORDER — ASPIRIN 81 MG PO CHEW: 81.0000 mg | CHEWABLE_TABLET | ORAL | Status: DC

## 2020-04-22 MED ORDER — PHENYLEPHRINE HCL-NACL 10-0.9 MG/250ML-% IV SOLN: 0.0000 ug/min | INTRAVENOUS | Status: DC

## 2020-04-22 MED ORDER — HEPARIN BOLUS VIA INFUSION
3000.0000 [IU] | Freq: Once | INTRAVENOUS | Status: AC
  Administered 2020-04-22: 3000 [IU] via INTRAVENOUS
  Filled 2020-04-22: qty 3000

## 2020-04-22 MED ORDER — HEPARIN (PORCINE) IN NACL 1000-0.9 UT/500ML-% IV SOLN
INTRAVENOUS | Status: DC | PRN
  Administered 2020-04-22: 500 mL

## 2020-04-22 MED ORDER — SODIUM CHLORIDE 0.9% FLUSH: 3.0000 mL | INTRAVENOUS | Status: DC | PRN

## 2020-04-22 MED ORDER — ACETAMINOPHEN 325 MG PO TABS: 650.0000 mg | ORAL_TABLET | ORAL | Status: DC | PRN

## 2020-04-22 MED ORDER — SODIUM CHLORIDE 0.9% FLUSH
3.0000 mL | Freq: Two times a day (BID) | INTRAVENOUS | Status: DC
  Administered 2020-04-23: 3 mL via INTRAVENOUS

## 2020-04-22 MED ORDER — SODIUM CHLORIDE 0.9 % WEIGHT BASED INFUSION: 1.0000 mL/kg/h | INTRAVENOUS | Status: AC

## 2020-04-22 MED ORDER — MIDAZOLAM HCL 2 MG/2ML IJ SOLN
INTRAMUSCULAR | Status: DC | PRN
  Administered 2020-04-22 (×2): 1 mg via INTRAVENOUS

## 2020-04-22 MED ORDER — IOHEXOL 300 MG/ML  SOLN
INTRAMUSCULAR | Status: DC | PRN
  Administered 2020-04-22: 135 mL

## 2020-04-22 MED ORDER — CLOPIDOGREL BISULFATE 75 MG PO TABS
75.0000 mg | ORAL_TABLET | Freq: Every day | ORAL | Status: DC
  Administered 2020-04-22 – 2020-04-23 (×2): 75 mg via ORAL
  Filled 2020-04-22 (×2): qty 1

## 2020-04-22 MED ORDER — SODIUM CHLORIDE 0.9 % IV SOLN: 250.0000 mL | INTRAVENOUS | Status: DC | PRN

## 2020-04-22 MED ORDER — FENTANYL CITRATE (PF) 100 MCG/2ML IJ SOLN
INTRAMUSCULAR | Status: DC | PRN
  Administered 2020-04-22 (×2): 25 ug via INTRAVENOUS
  Administered 2020-04-22: 50 ug via INTRAVENOUS

## 2020-04-22 MED ORDER — SODIUM CHLORIDE 0.9 % WEIGHT BASED INFUSION: 3.0000 mL/kg/h | INTRAVENOUS | Status: DC

## 2020-04-22 MED ORDER — LABETALOL HCL 5 MG/ML IV SOLN: 10.0000 mg | INTRAVENOUS | Status: AC | PRN

## 2020-04-22 MED ORDER — HEPARIN (PORCINE) IN NACL 1000-0.9 UT/500ML-% IV SOLN
INTRAVENOUS | Status: AC
  Filled 2020-04-22: qty 1000

## 2020-04-22 MED ORDER — SODIUM CHLORIDE 0.9 % IV SOLN
250.0000 mL | INTRAVENOUS | Status: DC | PRN
Start: 2020-04-23 — End: 2020-04-23

## 2020-04-22 MED ORDER — FENTANYL CITRATE (PF) 100 MCG/2ML IJ SOLN
INTRAMUSCULAR | Status: AC
  Filled 2020-04-22: qty 2

## 2020-04-22 MED ORDER — SODIUM CHLORIDE 0.9 % WEIGHT BASED INFUSION: 1.0000 mL/kg/h | INTRAVENOUS | Status: DC

## 2020-04-22 SURGICAL SUPPLY — 16 items
CATH ANGIO 5F JB2 100CM (CATHETERS) ×2 IMPLANT
CATH INFINITI 5 FR 3DRC (CATHETERS) ×4 IMPLANT
CATH INFINITI 5 FR IM (CATHETERS) ×2 IMPLANT
CATH INFINITI 5 FR JR3.5 (CATHETERS) ×2 IMPLANT
CATH INFINITI 5 FR LCB (CATHETERS) ×4 IMPLANT
CATH INFINITI 5 FR MPA2 (CATHETERS) ×2 IMPLANT
CATH INFINITI 5FR JL4 (CATHETERS) ×2 IMPLANT
CATH INFINITI JR4 5F (CATHETERS) ×2 IMPLANT
DEVICE CLOSURE MYNXGRIP 5F (Vascular Products) ×2 IMPLANT
KIT MANI 3VAL PERCEP (MISCELLANEOUS) ×2 IMPLANT
NEEDLE PERC 18GX7CM (NEEDLE) ×2 IMPLANT
PACK CARDIAC CATH (CUSTOM PROCEDURE TRAY) ×2 IMPLANT
SHEATH AVANTI 5FR X 11CM (SHEATH) ×2 IMPLANT
WIRE EMERALD 3MM-J .035X260CM (WIRE) ×2 IMPLANT
WIRE GUIDERIGHT .035X150 (WIRE) ×2 IMPLANT
WIRE HITORQ VERSACORE ST 145CM (WIRE) ×2 IMPLANT

## 2020-04-22 NOTE — Consult Note (Signed)
ANTICOAGULATION CONSULT NOTE  Pharmacy Consult for Heparin Infusion Indication: chest pain/ACS  Patient Measurements: Heparin Dosing Weight: 99.7 kg  Labs: Recent Labs    04/21/20 1301 04/21/20 1303 04/21/20 1608 04/21/20 2051 04/22/20 0104 04/22/20 0953  HGB 10.6*  --   --   --  10.1*  --   HCT 34.6*  --   --   --  31.3*  --   PLT 369  --   --   --  337  --   APTT  --  34  --   --   --   --   HEPARINUNFRC  --   --   --   --  <0.10* 0.30  CREATININE 1.92*  --   --   --  1.36*  --   TROPONINIHS 487*  --  515* 628*  --   --     Estimated Creatinine Clearance: 57.5 mL/min (A) (by C-G formula based on SCr of 1.36 mg/dL (H)).   Medical History: Past Medical History:  Diagnosis Date  . CAD (coronary artery disease)    a. 12/2006 NSTEMI/PCI: RCA 48m/d->mid vessel stented, unable to cross dist dzs w/ balloon;  b. 11/2013 NL MV;  c. 11/2013 Echo: EF 55%;  d. 12/2013 Cath: LM 95 ost, LAD 70p, 73m, LCX 30p/m, OM1 60, RCA 20p, 26m/77m, 95d.  . Carotid arterial disease (HCC)    a. s/p LCEA  . Diabetes mellitus (HCC)    type 2  . HTN (hypertension)   . Hyperlipidemia   . Hypertension   . Morbid obesity (HCC)   . Myocardial infarction (HCC)   . Neuromuscular disorder (HCC)    neuropathy left foot  . Neuropathy    LEFT FOOT  . Plantar fasciitis   . Pneumonia   . Wears dentures    full upper and lower partial    Medications:  No anticoagulation prior to admission per my chart review  Assessment: Patient is a 70 y/o M with medical history as above including CAD s/p CABG who presented to the ED 2/2 with altered mental status. Troponin elevated to 487. Patient ultimately found to have NSTEMI. Pharmacy has been consulted to initiate heparin infusion for ACS.  Baseline Hgb 10.6, platelets 369.  Baseline aPTT 34s  Date Time HL Rate/Comment 2/03 0104 <0.1 Subtherapeutic @1200  un/hr 2/03 0953 0.3 Lower limit of therapeutic range @1600  un/hr  Goal of Therapy:  Heparin level  0.3-0.7 units/ml Monitor platelets by anticoagulation protocol: Yes   Plan:  Therapeutic but at lower limit of range 0.3 (0.3-0.7). Will increase drip rate to 1700 units/hr.  Will recheck HL 8 hrs after rate change. Check CBC daily.  4/03 Danen Lapaglia 04/22/2020,10:31 AM

## 2020-04-22 NOTE — Consult Note (Signed)
ANTICOAGULATION CONSULT NOTE  Pharmacy Consult for Heparin Infusion Indication: chest pain/ACS  Patient Measurements: Heparin Dosing Weight: 99.7 kg  Labs: Recent Labs    04/21/20 1301 04/21/20 1303 04/21/20 1608 04/21/20 2051 04/22/20 0104  HGB 10.6*  --   --   --  10.1*  HCT 34.6*  --   --   --  31.3*  PLT 369  --   --   --  337  APTT  --  34  --   --   --   HEPARINUNFRC  --   --   --   --  <0.10*  CREATININE 1.92*  --   --   --  1.36*  TROPONINIHS 487*  --  515* 628*  --     Estimated Creatinine Clearance: 64.5 mL/min (A) (by C-G formula based on SCr of 1.36 mg/dL (H)).   Medical History: Past Medical History:  Diagnosis Date  . CAD (coronary artery disease)    a. 12/2006 NSTEMI/PCI: RCA 64m/d->mid vessel stented, unable to cross dist dzs w/ balloon;  b. 11/2013 NL MV;  c. 11/2013 Echo: EF 55%;  d. 12/2013 Cath: LM 95 ost, LAD 70p, 106m, LCX 30p/m, OM1 60, RCA 20p, 26m/63m, 95d.  . Carotid arterial disease (HCC)    a. s/p LCEA  . Diabetes mellitus (HCC)    type 2  . HTN (hypertension)   . Hyperlipidemia   . Hypertension   . Morbid obesity (HCC)   . Myocardial infarction (HCC)   . Neuromuscular disorder (HCC)    neuropathy left foot  . Neuropathy    LEFT FOOT  . Plantar fasciitis   . Pneumonia   . Wears dentures    full upper and lower partial    Medications:  No anticoagulation prior to admission per my chart review  Assessment: Patient is a 70 y/o M with medical history as above including CAD s/p CABG who presented to the ED 2/2 with altered mental status. Troponin elevated to 487. Patient ultimately found to have NSTEMI. Pharmacy has been consulted to initiate heparin infusion for ACS.  Baseline Hgb 10.6, platelets 369. Baseline aPTT and PT-INR pending.   Goal of Therapy:  Heparin level 0.3-0.7 units/ml Monitor platelets by anticoagulation protocol: Yes   Plan:  2/3:  HL @ 0104 = < 0.1  Will order heparin 3000 units IV X 1 bolus and increase drip  rate to 1600 units/hr. Will recheck HL 8 hrs after rate change.  Jake Castro D 04/22/2020,1:49 AM

## 2020-04-22 NOTE — Progress Notes (Addendum)
Progress Note    Jake Castro  BWI:[id number] DOB: 04-24-1950  DOA: 04/21/2020 PCP: Dorothey Baseman, MD      Brief Narrative:    Medical records reviewed and are as summarized below:  Jake Castro is a 70 y.o. male with multiple medical problems including but not limited to CAD s/p CABG, PVD, type II DM, hypertension, dyslipidemia, anxiety, who was brought to the hospital because of increasing confusion.  He had recently returned from [place] where he spent about a month.  Reportedly, he had been having constipation and abdominal pain while he was in [place].  He was found to have elevated troponins and acute kidney injury.  He was treated with IV fluids.  There was concern for acute NSTEMI so he was treated with IV heparin infusion.  Cardiologist was consulted and he underwent left heart catheterization.      Assessment/Plan:   Active Problems:   Acute encephalopathy   AKI (acute kidney injury) (HCC)   Body mass index is 29.86 kg/m.    Elevated troponins with suspected acute NSTEMI: Continue aspirin, simvastatin, carvedilol and IV heparin infusion.  Monitor heparin level per protocol.  Plan for cardiac catheterization today.  Follow-up with cardiologist.  Acute kidney injury: Improving.  Continue IV fluids.  Monitor BMP.  Acute metabolic encephalopathy: Improved  Hypertension: Discontinue lisinopril.  Continue carvedilol.  Type II DM: Hemoglobin A1c 6.7.  NovoLog as needed for hyperglycemia.  Anxiety: Xanax as needed.   Diet Order            Diet NPO time specified  Diet effective now                    Consultants:  Cardiologist  Procedures:  None    Medications:   . [MAR Hold] vitamin C  1,000 mg Oral Daily  . [START ON 04/23/2020] aspirin  81 mg Oral Pre-Cath  . [MAR Hold] aspirin EC  325 mg Oral Daily  . [MAR Hold] carvedilol  12.5 mg Oral BID WC  . [MAR Hold] cholecalciferol  5,000 Units Oral Daily  . [MAR Hold] Exenatide  ER   Subcutaneous Weekly  . [MAR Hold] insulin aspart  0-9 Units Subcutaneous TID WC  . [MAR Hold] l-methylfolate-B6-B12  1 tablet Oral Daily  . [MAR Hold] multivitamin with minerals  1 tablet Oral Daily  . [MAR Hold] simvastatin  40 mg Oral Daily   Continuous Infusions: . sodium chloride 100 mL/hr at 04/21/20 1926  . sodium chloride    . [START ON 04/23/2020] sodium chloride     Followed by  . [START ON 04/23/2020] sodium chloride    . heparin 1,700 Units/hr (04/22/20 1045)     Anti-infectives (From admission, onward)   None             Family Communication/Anticipated D/C date and plan/Code Status   DVT prophylaxis:      Code Status: Full Code  Family Communication: None Disposition Plan:    Status is: Inpatient  Remains inpatient appropriate because:Ongoing diagnostic testing needed not appropriate for outpatient work up and Inpatient level of care appropriate due to severity of illness   Dispo: The patient is from: Home              Anticipated d/c is to: Home              Anticipated d/c date is: 2 days  Patient currently is not medically stable to d/c.   Difficult to place patient No           Subjective:   Interval events noted.  No chest pain or shortness of breath.  Confusion is better.  He had diarrhea last night but none today.  He said he was recently in [place] and he believes the hot weather made him dehydrated.  Initially he had constipation but he started having diarrhea yesterday, on 04/21/2020.  Objective:    Vitals:   04/22/20 0355 04/22/20 0823 04/22/20 1326 04/22/20 1428  BP: 128/61 (!) 144/73 139/71   Pulse: 80 76 76   Resp: 17  (!) 21   Temp: 98.3 F (36.8 C) 98.1 F (36.7 C) 98.4 F (36.9 C)   TempSrc:  Oral Oral   SpO2: 98% 91% 94% 94%  Weight: 94.4 kg  94.4 kg   Height:   5\' 10"  (1.778 m)    No data found.   Intake/Output Summary (Last 24 hours) at 04/22/2020 1504 Last data filed at 04/22/2020  1303 Gross per 24 hour  Intake 240 ml  Output 600 ml  Net -360 ml   Filed Weights   04/21/20 1256 04/22/20 0355 04/22/20 1326  Weight: 119.3 kg 94.4 kg 94.4 kg    Exam:  GEN: NAD SKIN: No rash EYES: EOMI ENT: MMM CV: RRR PULM: CTA B ABD: soft, ND, NT, +BS CNS: AAO x 3, non focal EXT: No edema or tenderness   Data Reviewed:   I have personally reviewed following labs and imaging studies:  Labs: Labs show the following:   Basic Metabolic Panel: Recent Labs  Lab 04/21/20 1301 04/22/20 0104 04/22/20 1046  NA 138 142 141  K 3.9 3.4* 3.5  CL 101 104 105  CO2 26 29 25   GLUCOSE 141* 106* 126*  BUN 40* 29* 18  CREATININE 1.92* 1.36* 1.12  CALCIUM 8.9 9.1 9.0  MG 1.9  --   --    GFR Estimated Creatinine Clearance: 69.8 mL/min (by C-G formula based on SCr of 1.12 mg/dL). Liver Function Tests: Recent Labs  Lab 04/21/20 1301  AST 28  ALT 17  ALKPHOS 37*  BILITOT 0.7  PROT 7.7  ALBUMIN 3.7   No results for input(s): LIPASE, AMYLASE in the last 168 hours. Recent Labs  Lab 04/21/20 1303  AMMONIA 19   Coagulation profile Recent Labs  Lab 04/22/20 1046  INR 1.1    CBC: Recent Labs  Lab 04/21/20 1301 04/22/20 0104 04/22/20 1046  WBC 12.3* 9.5 8.2  NEUTROABS 8.9*  --   --   HGB 10.6* 10.1* 10.6*  HCT 34.6* 31.3* 32.4*  MCV 91.8 89.2 87.8  PLT 369 337 345   Cardiac Enzymes: No results for input(s): CKTOTAL, CKMB, CKMBINDEX, TROPONINI in the last 168 hours. BNP (last 3 results) No results for input(s): PROBNP in the last 8760 hours. CBG: Recent Labs  Lab 04/21/20 2105 04/22/20 0815 04/22/20 1208  GLUCAP 82 92 145*   D-Dimer: No results for input(s): DDIMER in the last 72 hours. Hgb A1c: Recent Labs    04/21/20 2051  HGBA1C 6.7*   Lipid Profile: No results for input(s): CHOL, HDL, LDLCALC, TRIG, CHOLHDL, LDLDIRECT in the last 72 hours. Thyroid function studies: No results for input(s): TSH, T4TOTAL, T3FREE, THYROIDAB in the last 72  hours.  Invalid input(s): FREET3 Anemia work up: No results for input(s): VITAMINB12, FOLATE, FERRITIN, TIBC, IRON, RETICCTPCT in the last 72 hours. Sepsis Labs:  Recent Labs  Lab 04/21/20 1301 04/21/20 1302 04/22/20 0104 04/22/20 1046  WBC 12.3*  --  9.5 8.2  LATICACIDVEN  --  1.6  --   --     Microbiology Recent Results (from the past 240 hour(s))  SARS Coronavirus 2 by RT PCR (hospital order, performed [facility]) Nasopharyngeal Nasopharyngeal Swab     Status: None   Collection Time: 04/21/20  2:01 PM   Specimen: Nasopharyngeal Swab  Result Value Ref Range Status   SARS Coronavirus 2 NEGATIVE NEGATIVE Final    Comment: (NOTE) SARS-CoV-2 target nucleic acids are NOT DETECTED.  The SARS-CoV-2 RNA is generally detectable in upper and lower respiratory specimens during the acute phase of infection. The lowest concentration of SARS-CoV-2 viral copies this assay can detect is 250 copies / mL. A negative result does not preclude SARS-CoV-2 infection and should not be used as the sole basis for treatment or other patient management decisions.  A negative result may occur with improper specimen collection / handling, submission of specimen other than nasopharyngeal swab, presence of viral mutation(s) within the areas targeted by this assay, and inadequate number of viral copies (<250 copies / mL). A negative result must be combined with clinical observations, patient history, and epidemiological information.  Fact Sheet for Patients:   BoilerBrush.com.cyhttps://www.fda.gov/media/136312/download  Fact Sheet for Healthcare Providers: https://pope.com/https://www.fda.gov/media/136313/download  This test is not yet approved or  cleared by the [place] FDA and has been authorized for detection and/or diagnosis of SARS-CoV-2 by FDA under an Emergency Use Authorization (EUA).  This EUA will remain in effect (meaning this test can be used) for the duration of the COVID-19 declaration under  Section 564(b)(1) of the Act, 21 U.S.C. section 360bbb-3(b)(1), unless the authorization is terminated or revoked sooner.  Performed at [facility], [address] [place], [place] [address]     Procedures and diagnostic studies:  CT Head Wo Contrast  Result Date: 04/21/2020 CLINICAL DATA:  Mental status change, unknown cause. Confusion since this a.m. EXAM: CT HEAD WITHOUT CONTRAST TECHNIQUE: Contiguous axial images were obtained from the base of the skull through the vertex without intravenous contrast. COMPARISON:  MRI brain February 11, 2010 and head CT February 07, 2010. FINDINGS: Brain: No evidence of acute infarction, hemorrhage, hydrocephalus, extra-axial collection or mass lesion/mass effect. Vascular: No hyperdense vessel or unexpected calcification. Atherosclerotic calcifications of the internal carotid and vertebral arteries. Skull: Normal. Negative for fracture or focal lesion. Sinuses/Orbits: Scattered mucosal thickening of the ethmoid sinuses. Other: None. IMPRESSION: 1. No acute intracranial findings. 2. Scattered mucosal thickening of the ethmoid sinuses. Electronically Signed   By: Maudry MayhewJeffrey  Waltz MD   On: 04/21/2020 13:49   MR BRAIN WO CONTRAST  Result Date: 04/22/2020 CLINICAL DATA:  Encephalopathy EXAM: MRI HEAD WITHOUT CONTRAST TECHNIQUE: Multiplanar, multiecho pulse sequences of the brain and surrounding structures were obtained without intravenous contrast. COMPARISON:  02/11/2010 FINDINGS: Brain: No acute infarct, mass effect or extra-axial collection. No acute or chronic hemorrhage. Normal white matter signal, parenchymal volume and CSF spaces. The midline structures are normal. Vascular: Major flow voids are preserved. Skull and upper cervical spine: Normal calvarium and skull base. Visualized upper cervical spine and soft tissues are normal. Sinuses/Orbits:No paranasal sinus fluid levels or advanced mucosal thickening. No mastoid or middle ear effusion. Normal  orbits. IMPRESSION: Normal aging brain. Electronically Signed   By: Deatra RobinsonKevin  Herman M.D.   On: 04/22/2020 00:51   CT Abdomen Pelvis W Contrast  Result Date: 04/21/2020  CLINICAL DATA:  Acute generalized abdominal pain. EXAM: CT ABDOMEN AND PELVIS WITH CONTRAST TECHNIQUE: Multidetector CT imaging of the abdomen and pelvis was performed using the standard protocol following bolus administration of intravenous contrast. CONTRAST:  75mL OMNIPAQUE IOHEXOL 300 MG/ML  SOLN COMPARISON:  None. FINDINGS: Lower chest: No acute abnormality. Hepatobiliary: Small gallstone is noted. No biliary dilatation is noted. The liver is unremarkable. Pancreas: Unremarkable. No pancreatic ductal dilatation or surrounding inflammatory changes. Spleen: Normal in size without focal abnormality. Adrenals/Urinary Tract: Adrenal glands appear normal. Small right renal cyst is noted. No hydronephrosis or renal obstruction is noted. No renal or ureteral calculi are noted. Urinary bladder is unremarkable. Stomach/Bowel: Stomach is within normal limits. Appendix appears normal. No evidence of bowel wall thickening, distention, or inflammatory changes. Vascular/Lymphatic: Aortic atherosclerosis. No enlarged abdominal or pelvic lymph nodes. Reproductive: Prostate is unremarkable. Other: No abdominal wall hernia or abnormality. No abdominopelvic ascites. Musculoskeletal: Grade 2 anterolisthesis of L5-S1 is noted secondary to bilateral L5 spondylolysis. IMPRESSION: 1. Small gallstone. 2. Grade 2 anterolisthesis of L5-S1 secondary to bilateral L5 spondylolysis. 3. No acute abnormality seen in the abdomen or pelvis. 4. Aortic atherosclerosis. Aortic Atherosclerosis (ICD10-I70.0). Electronically Signed   By: Lupita Raider M.D.   On: 04/21/2020 14:50   DG Chest Portable 1 View  Result Date: 04/21/2020 CLINICAL DATA:  Altered mental status. Progressive worsening of symptoms since Friday night EXAM: PORTABLE CHEST 1 VIEW COMPARISON:  02/04/2014 FINDINGS:  Previous median sternotomy and CABG procedure no pleural effusion. Mild diffuse increase interstitial markings with a focal airspace opacity in the left base. The visualized osseous structures are notable for bilateral AC joint osteoarthritis. Curvature of the thoracic spine is convex towards the right. IMPRESSION: 1. Increase interstitial markings compatible with pulmonary edema versus atypical infection. 2. A focal opacity is noted in the left base which may represent airspace disease or atelectasis. Electronically Signed   By: Signa Kell M.D.   On: 04/21/2020 14:19   ECHOCARDIOGRAM COMPLETE  Result Date: 04/22/2020    ECHOCARDIOGRAM REPORT   Patient Name:   IDAN PRIME Date of Exam: 04/21/2020 Medical Rec #:  [id number]       Height:       70.0 in Accession #:    [id number]      Weight:       263.0 lb Date of Birth:  10/16/1950       BSA:          2.345 m Patient Age:    69 years        BP:           125/64 mmHg Patient Gender: M               HR:           74 bpm. Exam Location:  [facility] Procedure: 2D Echo, Cardiac Doppler and Color Doppler Indications:     I21.4 NSTEMI  History:         Patient has no prior history of Echocardiogram examinations.                  Risk Factors:Hypertension, Dyslipidemia and Diabetes.                  Myocardial infarction. Coronary artery disease.  Sonographer:     Sedonia Small Rodgers-Jones Referring Phys:  [id number] Caryn Bee PACKER Diagnosing Phys: Adrian Blackwater MD IMPRESSIONS  1. Left ventricular ejection fraction, by estimation, is 55 to 60%. The left ventricle  has normal function. The left ventricle has no regional wall motion abnormalities. There is moderate concentric left ventricular hypertrophy. Left ventricular diastolic parameters are consistent with Grade III diastolic dysfunction (restrictive).  2. Right ventricular systolic function is normal. The right ventricular size is mildly enlarged.  3. Left atrial size was moderately dilated.  4. Right atrial size was  moderately dilated.  5. The mitral valve is degenerative. Trivial mitral valve regurgitation. No evidence of mitral stenosis. Moderate to severe mitral annular calcification.  6. The aortic valve is calcified. Aortic valve regurgitation is not visualized. Mild to moderate aortic valve sclerosis/calcification is present, without any evidence of aortic stenosis.  7. The inferior vena cava is normal in size with greater than 50% respiratory variability, suggesting right atrial pressure of 3 mmHg. FINDINGS  Left Ventricle: Left ventricular ejection fraction, by estimation, is 55 to 60%. The left ventricle has normal function. The left ventricle has no regional wall motion abnormalities. The left ventricular internal cavity size was normal in size. There is  moderate concentric left ventricular hypertrophy. Left ventricular diastolic parameters are consistent with Grade III diastolic dysfunction (restrictive). Right Ventricle: The right ventricular size is mildly enlarged. No increase in right ventricular wall thickness. Right ventricular systolic function is normal. Left Atrium: Left atrial size was moderately dilated. Right Atrium: Right atrial size was moderately dilated. Pericardium: There is no evidence of pericardial effusion. Mitral Valve: The mitral valve is degenerative in appearance. Moderate to severe mitral annular calcification. Trivial mitral valve regurgitation. No evidence of mitral valve stenosis. Tricuspid Valve: The tricuspid valve is normal in structure. Tricuspid valve regurgitation is mild . No evidence of tricuspid stenosis. Aortic Valve: The aortic valve is calcified. Aortic valve regurgitation is not visualized. Mild to moderate aortic valve sclerosis/calcification is present, without any evidence of aortic stenosis. Aortic valve mean gradient measures 10.2 mmHg. Aortic valve peak gradient measures 17.9 mmHg. Aortic valve area, by VTI measures 2.23 cm. Pulmonic Valve: The pulmonic valve was  normal in structure. Pulmonic valve regurgitation is trivial. No evidence of pulmonic stenosis. Aorta: The aortic root is normal in size and structure. Venous: The inferior vena cava is normal in size with greater than 50% respiratory variability, suggesting right atrial pressure of 3 mmHg. IAS/Shunts: No atrial level shunt detected by color flow Doppler.  LEFT VENTRICLE PLAX 2D LVIDd:         4.26 cm  Diastology LVIDs:         3.07 cm  LV e' medial:    7.18 cm/s LV PW:         1.02 cm  LV E/e' medial:  15.3 LV IVS:        1.01 cm  LV e' lateral:   10.20 cm/s LVOT diam:     2.00 cm  LV E/e' lateral: 10.8 LV SV:         89 LV SV Index:   38 LVOT Area:     3.14 cm  RIGHT VENTRICLE RV Basal diam:  3.98 cm RV S prime:     10.20 cm/s TAPSE (M-mode): 1.5 cm LEFT ATRIUM             Index       RIGHT ATRIUM           Index LA diam:        5.40 cm 2.30 cm/m  RA Area:     17.40 cm LA Vol (A2C):   53.1 ml 22.64 ml/m RA Volume:  53.80 ml  22.94 ml/m LA Vol (A4C):   40.5 ml 17.27 ml/m LA Biplane Vol: 47.1 ml 20.09 ml/m  AORTIC VALVE AV Area (Vmax):    1.91 cm AV Area (Vmean):   2.09 cm AV Area (VTI):     2.23 cm AV Vmax:           211.75 cm/s AV Vmean:          151.750 cm/s AV VTI:            0.397 m AV Peak Grad:      17.9 mmHg AV Mean Grad:      10.2 mmHg LVOT Vmax:         129.00 cm/s LVOT Vmean:        101.000 cm/s LVOT VTI:          0.282 m LVOT/AV VTI ratio: 0.71  AORTA Ao Root diam: 3.60 cm Ao Asc diam:  4.40 cm MITRAL VALVE                TRICUSPID VALVE MV Area (PHT): 3.08 cm     TR Peak grad:   21.7 mmHg MV Decel Time: 246 msec     TR Vmax:        233.00 cm/s MV E velocity: 110.00 cm/s MV A velocity: 111.00 cm/s  SHUNTS MV E/A ratio:  0.99         Systemic VTI:  0.28 m                             Systemic Diam: 2.00 cm Adrian Blackwater MD Electronically signed by Adrian Blackwater MD Signature Date/Time: 04/22/2020/11:33:00 AM    Final                LOS: 1 day   Jobie Popp  [facility]    Pager on [email or web address]. If 7PM-7AM, please contact night-coverage at [email or web address]     04/22/2020, 3:04 PM

## 2020-04-22 NOTE — Progress Notes (Signed)
SUBJECTIVE: No acute events overnight. Patient denies chest pain or shortness of breath. Patient denies confusion this AM and only has complaints of tiredness.   Vitals:   04/21/20 1928 04/21/20 2039 04/22/20 0355 04/22/20 0823  BP: 134/69 (!) 107/53 128/61 (!) 144/73  Pulse: 74 84 80 76  Resp: 20 19 17    Temp:  97.9 F (36.6 C) 98.3 F (36.8 C) 98.1 F (36.7 C)  TempSrc:  Oral  Oral  SpO2: 97% 97% 98% 91%  Weight:   94.4 kg   Height:        Intake/Output Summary (Last 24 hours) at 04/22/2020 1044 Last data filed at 04/22/2020 0930 Gross per 24 hour  Intake 240 ml  Output --  Net 240 ml    LABS: Basic Metabolic Panel: Recent Labs    04/21/20 1301 04/22/20 0104  NA 138 142  K 3.9 3.4*  CL 101 104  CO2 26 29  GLUCOSE 141* 106*  BUN 40* 29*  CREATININE 1.92* 1.36*  CALCIUM 8.9 9.1  MG 1.9  --    Liver Function Tests: Recent Labs    04/21/20 1301  AST 28  ALT 17  ALKPHOS 37*  BILITOT 0.7  PROT 7.7  ALBUMIN 3.7   No results for input(s): LIPASE, AMYLASE in the last 72 hours. CBC: Recent Labs    04/21/20 1301 04/22/20 0104  WBC 12.3* 9.5  NEUTROABS 8.9*  --   HGB 10.6* 10.1*  HCT 34.6* 31.3*  MCV 91.8 89.2  PLT 369 337   Cardiac Enzymes: No results for input(s): CKTOTAL, CKMB, CKMBINDEX, TROPONINI in the last 72 hours. BNP: Invalid input(s): POCBNP D-Dimer: No results for input(s): DDIMER in the last 72 hours. Hemoglobin A1C: Recent Labs    04/21/20 2051  HGBA1C 6.7*   Fasting Lipid Panel: No results for input(s): CHOL, HDL, LDLCALC, TRIG, CHOLHDL, LDLDIRECT in the last 72 hours. Thyroid Function Tests: No results for input(s): TSH, T4TOTAL, T3FREE, THYROIDAB in the last 72 hours.  Invalid input(s): FREET3 Anemia Panel: No results for input(s): VITAMINB12, FOLATE, FERRITIN, TIBC, IRON, RETICCTPCT in the last 72 hours.   PHYSICAL EXAM General: Well developed, well nourished, in no acute distress HEENT:  Normocephalic and atramatic Neck:   No JVD.  Lungs: Clear bilaterally to auscultation and percussion. Heart: HRRR . Normal S1 and S2 without gallops or murmurs.  Abdomen: Bowel sounds are positive, abdomen soft and non-tender  Msk:  Back normal, normal gait. Normal strength and tone for age. Extremities: No clubbing, cyanosis or edema.   Neuro: Alert and oriented X 3. Psych:  Good affect, responds appropriately  TELEMETRY: NSR 78/bpm  ASSESSMENT AND PLAN: Patient presented to the emergency department with worsening confusion and found to have NSTEMI.  CT and MRI of head were negative. Troponin has continued to increase to 628. Recommend continuing heparin infusion with plan for cardiac catheterization today. Patient was given breakfast even though was supposed to be NPO. Catheterization rescheduled for 1330 and please ensure NPO until then. Echocardiogram revealed LVEF 55-60% with mod LVH and Grade III diastolic dysfunction. Will continue to follow after the catheterization   Active Problems:   Acute encephalopathy    2052, NP-C 04/22/2020 10:44 AM

## 2020-04-22 NOTE — Plan of Care (Signed)
Pt arrived the unit this shift from the ED. Alert and oriented x3, on room air, afebrile. Pt on heparin drip. MRI this shift. Bed alarm on, call bell within reach Problem: Nutrition: Goal: Adequate nutrition will be maintained Outcome: Progressing   Problem: Coping: Goal: Level of anxiety will decrease Outcome: Progressing   Problem: Pain Managment: Goal: General experience of comfort will improve Outcome: Progressing

## 2020-04-23 ENCOUNTER — Encounter: Payer: Self-pay | Admitting: Cardiovascular Disease

## 2020-04-23 ENCOUNTER — Other Ambulatory Visit: Payer: Medicare PPO

## 2020-04-23 DIAGNOSIS — I214 Non-ST elevation (NSTEMI) myocardial infarction: Secondary | ICD-10-CM | POA: Diagnosis present

## 2020-04-23 LAB — GLUCOSE, CAPILLARY
Glucose-Capillary: 114 mg/dL — ABNORMAL HIGH (ref 70–99)
Glucose-Capillary: 123 mg/dL — ABNORMAL HIGH (ref 70–99)

## 2020-04-23 LAB — BASIC METABOLIC PANEL
Anion gap: 8 (ref 5–15)
BUN: 13 mg/dL (ref 8–23)
CO2: 26 mmol/L (ref 22–32)
Calcium: 9.1 mg/dL (ref 8.9–10.3)
Chloride: 107 mmol/L (ref 98–111)
Creatinine, Ser: 1.06 mg/dL (ref 0.61–1.24)
GFR, Estimated: >60 mL/min (ref >60)
Glucose, Bld: 99 mg/dL (ref 70–99)
Potassium: 3.4 mmol/L — ABNORMAL LOW (ref 3.5–5.1)
Sodium: 141 mmol/L (ref 135–145)

## 2020-04-23 LAB — CBC
HCT: 31.7 % — ABNORMAL LOW (ref 39.0–52.0)
Hemoglobin: 10.3 g/dL — ABNORMAL LOW (ref 13.0–17.0)
MCH: 28.2 pg (ref 26.0–34.0)
MCHC: 32.5 g/dL (ref 30.0–36.0)
MCV: 86.8 fL (ref 80.0–100.0)
Platelets: 356 10*3/uL (ref 150–400)
RBC: 3.65 MIL/uL — ABNORMAL LOW (ref 4.22–5.81)
RDW: 14.4 % (ref 11.5–15.5)
WBC: 10.8 10*3/uL — ABNORMAL HIGH (ref 4.0–10.5)
nRBC: 0 % (ref 0.0–0.2)

## 2020-04-23 MED ORDER — ISOSORBIDE MONONITRATE ER 30 MG PO TB24: 30.0000 mg | ORAL_TABLET | Freq: Every day | ORAL | 0 refills | Status: DC

## 2020-04-23 MED ORDER — TICAGRELOR 90 MG PO TABS: 90.0000 mg | ORAL_TABLET | Freq: Two times a day (BID) | ORAL | 0 refills | Status: DC

## 2020-04-23 MED ORDER — ROSUVASTATIN CALCIUM 40 MG PO TABS: 40.0000 mg | ORAL_TABLET | Freq: Every day | ORAL | 0 refills | Status: AC

## 2020-04-23 MED ORDER — POTASSIUM CHLORIDE CRYS ER 20 MEQ PO TBCR
40.0000 meq | EXTENDED_RELEASE_TABLET | Freq: Once | ORAL | Status: AC
  Administered 2020-04-23: 40 meq via ORAL
  Filled 2020-04-23: qty 2

## 2020-04-23 MED ORDER — ACETAMINOPHEN 325 MG PO TABS: 650.0000 mg | ORAL_TABLET | Freq: Four times a day (QID) | ORAL | Status: AC | PRN

## 2020-04-23 MED ORDER — CLOPIDOGREL BISULFATE 75 MG PO TABS: 75.0000 mg | ORAL_TABLET | Freq: Every day | ORAL | 0 refills | Status: DC

## 2020-04-23 MED ORDER — ROSUVASTATIN CALCIUM 10 MG PO TABS
40.0000 mg | ORAL_TABLET | Freq: Every day | ORAL | Status: DC
Start: 2020-04-23 — End: 2020-04-23
  Administered 2020-04-23: 40 mg via ORAL
  Filled 2020-04-23: qty 4

## 2020-04-23 NOTE — Discharge Summary (Addendum)
Physician Discharge Summary  Jake Castro:[id number] DOB: 1951/01/23 DOA: 04/21/2020  PCP: Dorothey Baseman, MD  Admit date: 04/21/2020 Discharge date: 04/23/2020  Discharge disposition: Home   Recommendations for Outpatient Follow-Up:   Follow-up with Dr. Adrian Blackwater, cardiologist, on Monday, 04/26/2020   Discharge Diagnosis:   Principal Problem:   NSTEMI (non-ST elevated myocardial infarction) [facility]) Active Problems:   Acute encephalopathy   AKI (acute kidney injury) (HCC)    Discharge Condition: Stable.  Diet recommendation:  Diet Order            Diet - low sodium heart healthy           Diet Carb Modified           Diet regular Room service appropriate? Yes; Fluid consistency: Thin  Diet effective now                   Code Status: Full Code     Hospital Course:   [name] is a 70 y.o. male with multiple medical problems including but not limited to CAD s/p CABG, PVD, type II DM, hypertension, dyslipidemia, anxiety, who was brought to the hospital because of increasing confusion.  He had recently returned from [place] where he spent about a month.    He said he he had been having constipation and abdominal pain while he was in [place].  He was found to have elevated troponins and acute kidney injury.  He was treated with IV fluids.  He was also diagnosed with acute NSTEMI.   He was treated with IV heparin infusion.  Cardiologist was consulted and he underwent left heart catheterization.  This showed near occlusion of SVG to PDA and diagonal.  Medical therapy was recommended.  Patient was taking aspirin prior to admission.  Cardiologist recommended adding Plavix for dual antiplatelet therapy.  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  This was discussed with Dr. Welton Flakes, cardiologist, who recommended substituting Brilinta for Plavix.  Imdur was also  added at the recommendation of the cardiologist.  Celebrex was on his home medicine list and he has been advised to avoid Celebrex and other NSAIDs because of dual antiplatelet therapy.  His condition has improved.  His mental status and creatinine are back to baseline.  He is deemed stable for discharge to home today.  Discharge plan was discussed with the patient and his wife at the bedside.       Discharge Exam:    Vitals:   04/22/20 1941 04/23/20 0341 04/23/20 0739 04/23/20 1142  BP: (!) 120/45 135/73 (!) 128/58 133/60  Pulse: 73 65 63 62  Resp: 19 17 20 20   Temp: 99.2 F (37.3 C) 98.5 F (36.9 C) 98.9 F (37.2 C) 99.1 F (37.3 C)  TempSrc: Oral  Oral Oral  SpO2: 92% 92% 93% 93%  Weight:  94.1 kg    Height:         GEN: NAD SKIN: No rash EYES: EOMI ENT: MMM CV: RRR PULM: CTA B ABD: soft, ND, NT, +BS CNS: AAO x 3, non focal EXT: No edema or tenderness   The results of significant diagnostics from this hospitalization (including imaging, microbiology, ancillary and laboratory) are listed below for reference.     Procedures and Diagnostic Studies:   CT Head Wo Contrast  Result Date: 04/21/2020 CLINICAL DATA:  Mental status change, unknown cause.  Confusion since this a.m. EXAM: CT HEAD WITHOUT CONTRAST TECHNIQUE: Contiguous axial images were obtained from the base of the skull through the vertex without intravenous contrast. COMPARISON:  MRI brain February 11, 2010 and head CT February 07, 2010. FINDINGS: Brain: No evidence of acute infarction, hemorrhage, hydrocephalus, extra-axial collection or mass lesion/mass effect. Vascular: No hyperdense vessel or unexpected calcification. Atherosclerotic calcifications of the internal carotid and vertebral arteries. Skull: Normal. Negative for fracture or focal lesion. Sinuses/Orbits: Scattered mucosal thickening of the ethmoid sinuses. Other: None. IMPRESSION: 1. No acute intracranial findings. 2. Scattered mucosal thickening of  the ethmoid sinuses. Electronically Signed   By: Maudry Mayhew MD   On: 04/21/2020 13:49   MR BRAIN WO CONTRAST  Result Date: 04/22/2020 CLINICAL DATA:  Encephalopathy EXAM: MRI HEAD WITHOUT CONTRAST TECHNIQUE: Multiplanar, multiecho pulse sequences of the brain and surrounding structures were obtained without intravenous contrast. COMPARISON:  02/11/2010 FINDINGS: Brain: No acute infarct, mass effect or extra-axial collection. No acute or chronic hemorrhage. Normal white matter signal, parenchymal volume and CSF spaces. The midline structures are normal. Vascular: Major flow voids are preserved. Skull and upper cervical spine: Normal calvarium and skull base. Visualized upper cervical spine and soft tissues are normal. Sinuses/Orbits:No paranasal sinus fluid levels or advanced mucosal thickening. No mastoid or middle ear effusion. Normal orbits. IMPRESSION: Normal aging brain. Electronically Signed   By: Deatra Robinson M.D.   On: 04/22/2020 00:51   CT Abdomen Pelvis W Contrast  Result Date: 04/21/2020 CLINICAL DATA:  Acute generalized abdominal pain. EXAM: CT ABDOMEN AND PELVIS WITH CONTRAST TECHNIQUE: Multidetector CT imaging of the abdomen and pelvis was performed using the standard protocol following bolus administration of intravenous contrast. CONTRAST:  75mL OMNIPAQUE IOHEXOL 300 MG/ML  SOLN COMPARISON:  None. FINDINGS: Lower chest: No acute abnormality. Hepatobiliary: Small gallstone is noted. No biliary dilatation is noted. The liver is unremarkable. Pancreas: Unremarkable. No pancreatic ductal dilatation or surrounding inflammatory changes. Spleen: Normal in size without focal abnormality. Adrenals/Urinary Tract: Adrenal glands appear normal. Small right renal cyst is noted. No hydronephrosis or renal obstruction is noted. No renal or ureteral calculi are noted. Urinary bladder is unremarkable. Stomach/Bowel: Stomach is within normal limits. Appendix appears normal. No evidence of bowel wall  thickening, distention, or inflammatory changes. Vascular/Lymphatic: Aortic atherosclerosis. No enlarged abdominal or pelvic lymph nodes. Reproductive: Prostate is unremarkable. Other: No abdominal wall hernia or abnormality. No abdominopelvic ascites. Musculoskeletal: Grade 2 anterolisthesis of L5-S1 is noted secondary to bilateral L5 spondylolysis. IMPRESSION: 1. Small gallstone. 2. Grade 2 anterolisthesis of L5-S1 secondary to bilateral L5 spondylolysis. 3. No acute abnormality seen in the abdomen or pelvis. 4. Aortic atherosclerosis. Aortic Atherosclerosis (ICD10-I70.0). Electronically Signed   By: Lupita Raider M.D.   On: 04/21/2020 14:50   CARDIAC CATHETERIZATION  Result Date: 04/22/2020  RPDA lesion is 70% stenosed.  Ost LM to Ost LAD lesion is 75% stenosed with 70% stenosed side branch in Ost Cx.  Mid LAD lesion is 80% stenosed.  Prox RCA lesion is 40% stenosed.  Origin lesion is 100% stenosed.  Prox Graft lesion is 100% stenosed.  Treat medically, PLV branch too small for PCI. LIMA to LAD patent, SVG to Om patent, SVG to PDA and Diagnal occluded.   DG Chest Portable 1 View  Result Date: 04/21/2020 CLINICAL DATA:  Altered mental status. Progressive worsening of symptoms since Friday night EXAM: PORTABLE CHEST 1 VIEW COMPARISON:  02/04/2014 FINDINGS: Previous median sternotomy and CABG procedure no pleural effusion. Mild diffuse increase  interstitial markings with a focal airspace opacity in the left base. The visualized osseous structures are notable for bilateral AC joint osteoarthritis. Curvature of the thoracic spine is convex towards the right. IMPRESSION: 1. Increase interstitial markings compatible with pulmonary edema versus atypical infection. 2. A focal opacity is noted in the left base which may represent airspace disease or atelectasis. Electronically Signed   By: Signa Kell M.D.   On: 04/21/2020 14:19   ECHOCARDIOGRAM COMPLETE  Result Date: 04/22/2020    ECHOCARDIOGRAM REPORT    Patient Name:   KEYWON MESTRE Date of Exam: 04/21/2020 Medical Rec #:  [id number]       Height:       70.0 in Accession #:    [id number]      Weight:       263.0 lb Date of Birth:  04-28-1950       BSA:          2.345 m Patient Age:    69 years        BP:           125/64 mmHg Patient Gender: M               HR:           74 bpm. Exam Location:  [facility] Procedure: 2D Echo, Cardiac Doppler and Color Doppler Indications:     I21.4 NSTEMI  History:         Patient has no prior history of Echocardiogram examinations.                  Risk Factors:Hypertension, Dyslipidemia and Diabetes.                  Myocardial infarction. Coronary artery disease.  Sonographer:     Sedonia Small Rodgers-Jones Referring Phys:  [id number] Caryn Bee PACKER Diagnosing Phys: Adrian Blackwater MD IMPRESSIONS  1. Left ventricular ejection fraction, by estimation, is 55 to 60%. The left ventricle has normal function. The left ventricle has no regional wall motion abnormalities. There is moderate concentric left ventricular hypertrophy. Left ventricular diastolic parameters are consistent with Grade III diastolic dysfunction (restrictive).  2. Right ventricular systolic function is normal. The right ventricular size is mildly enlarged.  3. Left atrial size was moderately dilated.  4. Right atrial size was moderately dilated.  5. The mitral valve is degenerative. Trivial mitral valve regurgitation. No evidence of mitral stenosis. Moderate to severe mitral annular calcification.  6. The aortic valve is calcified. Aortic valve regurgitation is not visualized. Mild to moderate aortic valve sclerosis/calcification is present, without any evidence of aortic stenosis.  7. The inferior vena cava is normal in size with greater than 50% respiratory variability, suggesting right atrial pressure of 3 mmHg. FINDINGS  Left Ventricle: Left ventricular ejection fraction, by estimation, is 55 to 60%. The left ventricle has normal function. The left ventricle has no regional  wall motion abnormalities. The left ventricular internal cavity size was normal in size. There is  moderate concentric left ventricular hypertrophy. Left ventricular diastolic parameters are consistent with Grade III diastolic dysfunction (restrictive). Right Ventricle: The right ventricular size is mildly enlarged. No increase in right ventricular wall thickness. Right ventricular systolic function is normal. Left Atrium: Left atrial size was moderately dilated. Right Atrium: Right atrial size was moderately dilated. Pericardium: There is no evidence of pericardial effusion. Mitral Valve: The mitral valve is degenerative in appearance. Moderate to severe mitral annular calcification. Trivial mitral valve regurgitation. No evidence  of mitral valve stenosis. Tricuspid Valve: The tricuspid valve is normal in structure. Tricuspid valve regurgitation is mild . No evidence of tricuspid stenosis. Aortic Valve: The aortic valve is calcified. Aortic valve regurgitation is not visualized. Mild to moderate aortic valve sclerosis/calcification is present, without any evidence of aortic stenosis. Aortic valve mean gradient measures 10.2 mmHg. Aortic valve peak gradient measures 17.9 mmHg. Aortic valve area, by VTI measures 2.23 cm. Pulmonic Valve: The pulmonic valve was normal in structure. Pulmonic valve regurgitation is trivial. No evidence of pulmonic stenosis. Aorta: The aortic root is normal in size and structure. Venous: The inferior vena cava is normal in size with greater than 50% respiratory variability, suggesting right atrial pressure of 3 mmHg. IAS/Shunts: No atrial level shunt detected by color flow Doppler.  LEFT VENTRICLE PLAX 2D LVIDd:         4.26 cm  Diastology LVIDs:         3.07 cm  LV e' medial:    7.18 cm/s LV PW:         1.02 cm  LV E/e' medial:  15.3 LV IVS:        1.01 cm  LV e' lateral:   10.20 cm/s LVOT diam:     2.00 cm  LV E/e' lateral: 10.8 LV SV:         89 LV SV Index:   38 LVOT Area:     3.14  cm  RIGHT VENTRICLE RV Basal diam:  3.98 cm RV S prime:     10.20 cm/s TAPSE (M-mode): 1.5 cm LEFT ATRIUM             Index       RIGHT ATRIUM           Index LA diam:        5.40 cm 2.30 cm/m  RA Area:     17.40 cm LA Vol (A2C):   53.1 ml 22.64 ml/m RA Volume:   53.80 ml  22.94 ml/m LA Vol (A4C):   40.5 ml 17.27 ml/m LA Biplane Vol: 47.1 ml 20.09 ml/m  AORTIC VALVE AV Area (Vmax):    1.91 cm AV Area (Vmean):   2.09 cm AV Area (VTI):     2.23 cm AV Vmax:           211.75 cm/s AV Vmean:          151.750 cm/s AV VTI:            0.397 m AV Peak Grad:      17.9 mmHg AV Mean Grad:      10.2 mmHg LVOT Vmax:         129.00 cm/s LVOT Vmean:        101.000 cm/s LVOT VTI:          0.282 m LVOT/AV VTI ratio: 0.71  AORTA Ao Root diam: 3.60 cm Ao Asc diam:  4.40 cm MITRAL VALVE                TRICUSPID VALVE MV Area (PHT): 3.08 cm     TR Peak grad:   21.7 mmHg MV Decel Time: 246 msec     TR Vmax:        233.00 cm/s MV E velocity: 110.00 cm/s MV A velocity: 111.00 cm/s  SHUNTS MV E/A ratio:  0.99         Systemic VTI:  0.28 m  Systemic Diam: 2.00 cm Adrian Blackwater MD Electronically signed by Adrian Blackwater MD Signature Date/Time: 04/22/2020/11:33:00 AM    Final      Labs:   Basic Metabolic Panel: Recent Labs  Lab 04/21/20 1301 04/22/20 0104 04/22/20 1046 04/23/20 0405  NA 138 142 141 141  K 3.9 3.4* 3.5 3.4*  CL 101 104 105 107  CO2 26 29 25 26   GLUCOSE 141* 106* 126* 99  BUN 40* 29* 18 13  CREATININE 1.92* 1.36* 1.12 1.06  CALCIUM 8.9 9.1 9.0 9.1  MG 1.9  --   --   --    GFR Estimated Creatinine Clearance: 73.6 mL/min (by C-G formula based on SCr of 1.06 mg/dL). Liver Function Tests: Recent Labs  Lab 04/21/20 1301  AST 28  ALT 17  ALKPHOS 37*  BILITOT 0.7  PROT 7.7  ALBUMIN 3.7   No results for input(s): LIPASE, AMYLASE in the last 168 hours. Recent Labs  Lab 04/21/20 1303  AMMONIA 19   Coagulation profile Recent Labs  Lab 04/22/20 1046  INR 1.1     CBC: Recent Labs  Lab 04/21/20 1301 04/22/20 0104 04/22/20 1046 04/23/20 0405  WBC 12.3* 9.5 8.2 10.8*  NEUTROABS 8.9*  --   --   --   HGB 10.6* 10.1* 10.6* 10.3*  HCT 34.6* 31.3* 32.4* 31.7*  MCV 91.8 89.2 87.8 86.8  PLT 369 337 345 356   Cardiac Enzymes: No results for input(s): CKTOTAL, CKMB, CKMBINDEX, TROPONINI in the last 168 hours. BNP: Invalid input(s): POCBNP CBG: Recent Labs  Lab 04/22/20 1208 04/22/20 1624 04/22/20 1942 04/23/20 0824 04/23/20 1131  GLUCAP 145* 93 163* 114* 123*   D-Dimer No results for input(s): DDIMER in the last 72 hours. Hgb A1c Recent Labs    04/21/20 2051  HGBA1C 6.7*   Lipid Profile Recent Labs    04/22/20 1853  CHOL 121  HDL 46  LDLCALC 46  TRIG 146  CHOLHDL 2.6   Thyroid function studies No results for input(s): TSH, T4TOTAL, T3FREE, THYROIDAB in the last 72 hours.  Invalid input(s): FREET3 Anemia work up No results for input(s): VITAMINB12, FOLATE, FERRITIN, TIBC, IRON, RETICCTPCT in the last 72 hours. Microbiology Recent Results (from the past 240 hour(s))  SARS Coronavirus 2 by RT PCR (hospital order, performed in [facility] hospital lab) Nasopharyngeal Nasopharyngeal Swab     Status: None   Collection Time: 04/21/20  2:01 PM   Specimen: Nasopharyngeal Swab  Result Value Ref Range Status   SARS Coronavirus 2 NEGATIVE NEGATIVE Final    Comment: (NOTE) SARS-CoV-2 target nucleic acids are NOT DETECTED.  The SARS-CoV-2 RNA is generally detectable in upper and lower respiratory specimens during the acute phase of infection. The lowest concentration of SARS-CoV-2 viral copies this assay can detect is 250 copies / mL. A negative result does not preclude SARS-CoV-2 infection and should not be used as the sole basis for treatment or other patient management decisions.  A negative result may occur with improper specimen collection / handling, submission of specimen other than nasopharyngeal swab, presence of  viral mutation(s) within the areas targeted by this assay, and inadequate number of viral copies (<250 copies / mL). A negative result must be combined with clinical observations, patient history, and epidemiological information.  Fact Sheet for Patients:   BoilerBrush.com.cy  Fact Sheet for Healthcare Providers: https://pope.com/  This test is not yet approved or  cleared by the [place] FDA and has been authorized for detection and/or diagnosis of  SARS-CoV-2 by FDA under an Emergency Use Authorization (EUA).  This EUA will remain in effect (meaning this test can be used) for the duration of the COVID-19 declaration under Section 564(b)(1) of the Act, 21 U.S.C. section 360bbb-3(b)(1), unless the authorization is terminated or revoked sooner.  Performed at [facility], [address] [place], [place] [address]      Discharge Instructions:   Discharge Instructions    AMB Referral to Cardiac Rehabilitation - Phase II   Complete by: As directed    Diagnosis: NSTEMI   After initial evaluation and assessments completed: Virtual Based Care may be provided alone or in conjunction with Phase 2 Cardiac Rehab based on patient barriers.: Yes   Diet - low sodium heart healthy   Complete by: As directed    Diet Carb Modified   Complete by: As directed    Discharge instructions   Complete by: As directed    Follow up with Dr. Adrian Blackwater in 1 week   Increase activity slowly   Complete by: As directed      Allergies as of 04/23/2020      Reactions   Kiwi Extract Nausea And Vomiting, Other (See Comments)   Throat bumps on throat       Medication List    STOP taking these medications   celecoxib 200 MG capsule Commonly known as: CELEBREX   simvastatin 40 MG tablet Commonly known as: ZOCOR     TAKE these medications   acetaminophen 325 MG tablet Commonly known as: TYLENOL Take 2 tablets (650 mg total) by mouth  every 6 (six) hours as needed for mild pain (or Fever >/= 101).   aspirin 81 MG tablet Take 81 mg by mouth daily. Am   carvedilol 12.5 MG tablet Commonly known as: COREG Take 12.5 mg by mouth 2 (two) times daily with a meal.   Exenatide ER 2 MG Srer Inject into the skin once a week.   fenofibrate 145 MG tablet Commonly known as: TRICOR Take 145 mg by mouth daily.   isosorbide mononitrate 30 MG 24 hr tablet Commonly known as: IMDUR Take 1 tablet (30 mg total) by mouth daily.   KRILL OIL PO Take 1,000 mg by mouth daily.   l-methylfolate-B6-B12 3-35-2 MG Tabs tablet Commonly known as: METANX Take 1 tablet by mouth daily. am   lisinopril 40 MG tablet Commonly known as: ZESTRIL Take 40 mg by mouth daily. am   metFORMIN 1000 MG tablet Commonly known as: GLUCOPHAGE Take 1,000 mg by mouth 2 (two) times daily with a meal. Am and bedtime   Methylcobalamin 1 MG Chew Chew by mouth.   multivitamin capsule Take 1 capsule by mouth daily.   PROSTATE PO Take by mouth daily. B6 10mg  and zinc 30mg    rosuvastatin 40 MG tablet Commonly known as: CRESTOR Take 1 tablet (40 mg total) by mouth daily. Start taking on: April 24, 2020   ticagrelor 90 MG Tabs tablet Commonly known as: Brilinta Take 1 tablet (90 mg total) by mouth 2 (two) times daily.   Ubiquinol 200 MG Caps Take by mouth daily.   vitamin C 1000 MG tablet Take 1,000 mg by mouth daily.   Vitamin D-3 125 MCG (5000 UT) Tabs Take 5,000 mg by mouth daily.   VITAMIN K PO Take 1 tablet by mouth daily.       Follow-up Information    , MD. Go on 05/03/2020.   Specialty: Cardiology Why: at 9:00 AM Contact  information: [address] [place] [address] [phone]                Time coordinating discharge: 35 minutes  Signed:  Lurene Shadow  [facility] 04/23/2020, 5:22 PM   Pager on [email or web address]. If 7PM-7AM, please contact night-coverage at [email or web address]

## 2020-04-23 NOTE — Progress Notes (Signed)
SUBJECTIVE: Patient resting comfortably. Denies chest pain or shortness of breath. Patient denies confusion. No acute events overnight.   Vitals:   04/22/20 1630 04/22/20 1941 04/23/20 0341 04/23/20 0739  BP: (!) 150/71 (!) 120/45 135/73 (!) 128/58  Pulse: 65 73 65 63  Resp: 18 19 17 20   Temp:  99.2 F (37.3 C) 98.5 F (36.9 C) 98.9 F (37.2 C)  TempSrc:  Oral  Oral  SpO2: 94% 92% 92% 93%  Weight:   94.1 kg   Height:        Intake/Output Summary (Last 24 hours) at 04/23/2020 0958 Last data filed at 04/23/2020 0930 Gross per 24 hour  Intake 826.25 ml  Output 1100 ml  Net -273.75 ml    LABS: Basic Metabolic Panel: Recent Labs    04/21/20 1301 04/22/20 0104 04/22/20 1046 04/23/20 0405  NA 138   < > 141 141  K 3.9   < > 3.5 3.4*  CL 101   < > 105 107  CO2 26   < > 25 26  GLUCOSE 141*   < > 126* 99  BUN 40*   < > 18 13  CREATININE 1.92*   < > 1.12 1.06  CALCIUM 8.9   < > 9.0 9.1  MG 1.9  --   --   --    < > = values in this interval not displayed.   Liver Function Tests: Recent Labs    04/21/20 1301  AST 28  ALT 17  ALKPHOS 37*  BILITOT 0.7  PROT 7.7  ALBUMIN 3.7   No results for input(s): LIPASE, AMYLASE in the last 72 hours. CBC: Recent Labs    04/21/20 1301 04/22/20 0104 04/22/20 1046 04/23/20 0405  WBC 12.3*   < > 8.2 10.8*  NEUTROABS 8.9*  --   --   --   HGB 10.6*   < > 10.6* 10.3*  HCT 34.6*   < > 32.4* 31.7*  MCV 91.8   < > 87.8 86.8  PLT 369   < > 345 356   < > = values in this interval not displayed.   Cardiac Enzymes: No results for input(s): CKTOTAL, CKMB, CKMBINDEX, TROPONINI in the last 72 hours. BNP: Invalid input(s): POCBNP D-Dimer: No results for input(s): DDIMER in the last 72 hours. Hemoglobin A1C: Recent Labs    04/21/20 2051  HGBA1C 6.7*   Fasting Lipid Panel: Recent Labs    04/22/20 1853  CHOL 121  HDL 46  LDLCALC 46  TRIG 146  CHOLHDL 2.6   Thyroid Function Tests: No results for input(s): TSH, T4TOTAL, T3FREE,  THYROIDAB in the last 72 hours.  Invalid input(s): FREET3 Anemia Panel: No results for input(s): VITAMINB12, FOLATE, FERRITIN, TIBC, IRON, RETICCTPCT in the last 72 hours.   PHYSICAL EXAM General: Well developed, well nourished, in no acute distress HEENT:  Normocephalic and atramatic Neck:  No JVD.  Lungs: Clear bilaterally to auscultation and percussion. Heart: HRRR . Normal S1 and S2 without gallops or murmurs.  Abdomen: Bowel sounds are positive, abdomen soft and non-tender  Msk:  Back normal, normal gait. Normal strength and tone for age. Extremities: No clubbing, cyanosis or edema.   Neuro: Alert and oriented X 3. Psych:  Good affect, responds appropriately  TELEMETRY: NSR 70/bpm  ASSESSMENT AND PLAN: Patient presented to the emergency department with worsening confusion and found to have NSTEMI. CT and MRI of head were negative. 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 No intervention at this time and plan to continue medical management. Acute confusion most likely not cardiac in nature. Plan on dual antiplatelet, resume lisinopril on discharge, and switching the patient to rosuvastatin from simvastatin. Patient states he would like to transfer care to our office so plan on f/u next week. From a cardiology point of view patient is stable for discharge.  Active Problems:   Acute encephalopathy   AKI (acute kidney injury) (HCC)    Maryelizabeth Kaufmann, NP-C 04/23/2020 9:58 AM

## 2020-04-23 NOTE — Plan of Care (Signed)
  Problem: Health Behavior/Discharge Planning: Goal: Ability to manage health-related needs will improve Outcome: Progressing   Problem: Clinical Measurements: Goal: Ability to maintain clinical measurements within normal limits will improve Outcome: Progressing Goal: Will remain free from infection Outcome: Progressing Goal: Diagnostic test results will improve Outcome: Progressing Goal: Respiratory complications will improve Outcome: Progressing Goal: Cardiovascular complication will be avoided Outcome: Progressing   Problem: Nutrition: Goal: Adequate nutrition will be maintained Outcome: Progressing   Problem: Coping: Goal: Level of anxiety will decrease Outcome: Progressing   Problem: Pain Managment: Goal: General experience of comfort will improve Outcome: Progressing   Problem: Safety: Goal: Ability to remain free from injury will improve Outcome: Progressing

## 2020-04-23 NOTE — Care Management Important Message (Signed)
Important Message  Patient Details  Name: Jake Castro MRN: [id number] Date of Birth: 1951-03-03   Medicare Important Message Given:  Yes     [name] 04/23/2020, 12:31 PM

## 2020-04-23 NOTE — Progress Notes (Signed)
Mobility Specialist - Progress Note   04/23/20 1152  Mobility  Activity Ambulated in room;Ambulated in hall  Level of Assistance Independent (CGA for safety)  Assistive Device None  Distance Ambulated (ft) 380 ft  Mobility Response Tolerated well  Mobility performed by Mobility specialist  $Mobility charge 1 Mobility    Attempted session 2x earlier. Pt unavailable (in the bathroom 1st attempt and on the phone 2nd attempt). Re-attempted session at this time, pt very agreeable. Pt independent. CGA utilized for safety. Pt ambulated 380' total in room and hallway. Slight LOB noted. Encouraged pt to take his time for safety. Pt showed understanding. LOB corrected. No c/o pain or SOB. Overall, pt tolerated session very well. Pt left sitting EOB, eating lunch w/ wife at bedside. All needs placed in reach.     Bernard Donahoo Mobility Specialist  04/23/20, 11:55 AM

## 2020-05-04 ENCOUNTER — Other Ambulatory Visit: Payer: Self-pay

## 2020-05-04 ENCOUNTER — Encounter: Payer: Medicare PPO | Attending: Cardiovascular Disease

## 2020-05-04 DIAGNOSIS — Z7982 Long term (current) use of aspirin: Secondary | ICD-10-CM | POA: Insufficient documentation

## 2020-05-04 DIAGNOSIS — I214 Non-ST elevation (NSTEMI) myocardial infarction: Secondary | ICD-10-CM | POA: Insufficient documentation

## 2020-05-04 DIAGNOSIS — Z87891 Personal history of nicotine dependence: Secondary | ICD-10-CM | POA: Insufficient documentation

## 2020-05-04 DIAGNOSIS — Z79899 Other long term (current) drug therapy: Secondary | ICD-10-CM | POA: Insufficient documentation

## 2020-05-04 NOTE — Progress Notes (Signed)
Virtual Visit completed. Patient informed on EP and RD appointment and 6 Minute walk test. Patient also informed of patient health questionnaires on My Chart. Patient Verbalizes understanding. Visit diagnosis can be found in [facility] 04/21/2020.

## 2020-05-11 ENCOUNTER — Other Ambulatory Visit: Payer: Self-pay

## 2020-05-11 ENCOUNTER — Encounter: Payer: Medicare PPO | Admitting: *Deleted

## 2020-05-11 VITALS — Ht 71.1 in | Wt 205.2 lb

## 2020-05-11 DIAGNOSIS — I214 Non-ST elevation (NSTEMI) myocardial infarction: Secondary | ICD-10-CM | POA: Diagnosis not present

## 2020-05-11 DIAGNOSIS — Z7982 Long term (current) use of aspirin: Secondary | ICD-10-CM | POA: Diagnosis not present

## 2020-05-11 DIAGNOSIS — Z87891 Personal history of nicotine dependence: Secondary | ICD-10-CM | POA: Diagnosis not present

## 2020-05-11 DIAGNOSIS — Z79899 Other long term (current) drug therapy: Secondary | ICD-10-CM | POA: Diagnosis not present

## 2020-05-11 NOTE — Progress Notes (Signed)
Cardiac Individual Treatment Plan  Patient Details  Name: Jake Castro MRN: [id number] Date of Birth: Apr 23, 1950 Referring Provider:   Flowsheet Row Cardiac Rehab from 05/11/2020 in [facility] Cardiac and Pulmonary Rehab  Referring Provider [name] MD      Initial Encounter Date:  Flowsheet Row Cardiac Rehab from 05/11/2020 in [facility] Cardiac and Pulmonary Rehab  Date 05/11/20      Visit Diagnosis: NSTEMI (non-ST elevated myocardial infarction) [facility])  Patient's Home Medications on Admission:  Current Outpatient Medications:    acetaminophen (TYLENOL) 325 MG tablet, Take 2 tablets (650 mg total) by mouth every 6 (six) hours as needed for mild pain (or Fever >/= 101). (Patient not taking: Reported on 05/04/2020), Disp: , Rfl:    Ascorbic Acid (VITAMIN C) 1000 MG tablet, Take 1,000 mg by mouth daily. (Patient not taking: No sig reported), Disp: , Rfl:    aspirin 325 MG tablet, Take by mouth., Disp: , Rfl:    aspirin 81 MG tablet, Take 81 mg by mouth daily. Am (Patient not taking: No sig reported), Disp: , Rfl:    Blood Glucose Monitoring Suppl (ONETOUCH VERIO FLEX SYSTEM) w/Device KIT, U UTD TO TEST BLOOD SUGAR, Disp: , Rfl:    carvedilol (COREG) 12.5 MG tablet, Take 12.5 mg by mouth 2 (two) times daily with a meal., Disp: , Rfl:    chlorhexidine (PERIDEX) 0.12 % solution, chlorhexidine gluconate 0.12 % mouthwash  RINSE AND SPIT WITH 1/2 OZ FOR 30 SECONDS BID FOR 2 WEEKS, Disp: , Rfl:    Cholecalciferol (VITAMIN D-3) 5000 UNITS TABS, Take 5,000 mg by mouth daily., Disp: , Rfl:    Exenatide ER 2 MG SRER, Inject into the skin once a week. (Patient not taking: Reported on 05/04/2020), Disp: , Rfl:    fenofibrate (TRICOR) 145 MG tablet, Take 145 mg by mouth daily., Disp: , Rfl:    ferrous sulfate 325 (65 FE) MG tablet, Take by mouth. (Patient not taking: Reported on 05/04/2020), Disp: , Rfl:    glucose blood (ONETOUCH ULTRA) test strip, U UTD TO TEST BLOOD SUGAR (Patient not taking:  Reported on 05/04/2020), Disp: , Rfl:    glucose blood (PRECISION QID TEST) test strip, U UTD TO TEST BLOOD SUGAR (Patient not taking: Reported on 05/04/2020), Disp: , Rfl:    isosorbide mononitrate (IMDUR) 30 MG 24 hr tablet, Take 1 tablet (30 mg total) by mouth daily., Disp: 30 tablet, Rfl: 0   KRILL OIL PO, Take 1,000 mg by mouth daily.  (Patient not taking: No sig reported), Disp: , Rfl:    l-methylfolate-B6-B12 (METANX) 3-35-2 MG TABS tablet, Take 1 tablet by mouth daily. am (Patient not taking: No sig reported), Disp: , Rfl:    lisinopril (PRINIVIL,ZESTRIL) 40 MG tablet, Take 40 mg by mouth daily. am, Disp: , Rfl:    metFORMIN (GLUCOPHAGE) 1000 MG tablet, Take 1,000 mg by mouth 2 (two) times daily with a meal. Am and bedtime, Disp: , Rfl:    Methylcobalamin 1 MG CHEW, Chew by mouth. (Patient not taking: No sig reported), Disp: , Rfl:    Multiple Vitamin (MULTIVITAMIN) capsule, Take 1 capsule by mouth daily. (Patient not taking: No sig reported), Disp: , Rfl:    Omega-3 Fatty Acids (FISH OIL) 1000 MG CAPS, omega-3 acid ethyl esters 1 gram capsule, Disp: , Rfl:    rosuvastatin (CRESTOR) 40 MG tablet, Take 1 tablet (40 mg total) by mouth daily., Disp: 30 tablet, Rfl: 0   Semaglutide,0.25 or 0.5MG/DOS, (OZEMPIC, 0.25 OR 0.5  MG/DOSE,) 2 MG/1.5ML SOPN, Ozempic 0.25 mg or 0.5 mg (2 mg/1.5 mL) subcutaneous pen injector, Disp: , Rfl:    Specialty Vitamins Products (PROSTATE PO), Take by mouth daily. B6 48m and zinc 362m(Patient not taking: No sig reported), Disp: , Rfl:    ticagrelor (BRILINTA) 90 MG TABS tablet, Take 1 tablet (90 mg total) by mouth 2 (two) times daily., Disp: 60 tablet, Rfl: 0   triamcinolone cream (KENALOG) 0.5 %, triamcinolone acetonide 0.5 % topical cream, Disp: , Rfl:    Ubiquinol 200 MG CAPS, Take by mouth daily. (Patient not taking: No sig reported), Disp: , Rfl:    VITAMIN K PO, Take 1 tablet by mouth daily., Disp: , Rfl:  No current facility-administered  medications for this visit.  Facility-Administered Medications Ordered in Other Visits:    sodium chloride flush (NS) 0.9 % injection 3 mL, 3 mL, Intravenous, Q12H, KhDionisio DavidMD  Past Medical History: Past Medical History:  Diagnosis Date   CAD (coronary artery disease)    a. 12/2006 NSTEMI/PCI: RCA 9562m>mid vessel stented, unable to cross dist dzs w/ balloon;  b. 11/2013 NL MV;  c. 11/2013 Echo: EF 55%;  d. 12/2013 Cath: LM 95 ost, LAD 70p, 72m7mX 30p/m, OM1 60, RCA 20p, 7m/30m 95d.   Carotid arterial disease (HCC)    a. 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    Labs: Recent Review Flowsheet Data    Labs for ITP Cardiac and Pulmonary Rehab Latest Ref Rng & Units 01/03/2014 01/03/2014 01/04/2014 04/21/2020 04/22/2020   Cholestrol 0 - 200 mg/dL - - - - 121   LDLCALC 0 - 99 mg/dL - - - - 46   HDL >40 mg/dL - - - - 46   Trlycerides <150 mg/dL - - - - 146   Hemoglobin A1c 4.8 - 5.6 % - - - 6.7(H) -   PHART 7.350 - 7.450 7.344(L) - - - -   PCO2ART 35.0 - 45.0 mmHg 52.4(H) - - - -   HCO3 20.0 - 28.0 mmol/L 28.5(H) - - 29.2(H) -   TCO2 0 - 100 mmol/L _0 - -   ACIDBASEDEF 0.0 - 2.0 mmol/L - - - - -   O2SAT % 95.0 - - 60.8 -       Exercise Target Goals: Exercise Program Goal: Individual exercise prescription set using results from initial 6 min walk test and THRR while  considering  patients activity barriers and safety.   Exercise Prescription Goal: Initial exercise prescription builds to 30-45 minutes a day of aerobic activity, 2-3 days per week.  Home exercise guidelines will be given to patient during program as part of exercise prescription that the participant will acknowledge.  Education: Aerobic Exercise: - Group verbal and visual presentation on the components of exercise prescription. Introduces F.I.T.T principle from ACSM for exercise prescriptions.  Reviews F.I.T.T. principles of aerobic exercise including progression. Written material given at graduation. Flowsheet Row Cardiac Rehab from 05/11/2020 in [facility] Cardiac and Pulmonary Rehab  Education need identified 05/11/20      Education: Resistance Exercise: - Group verbal and visual presentation on the components of exercise prescription. Introduces F.I.T.T principle from ACSM for exercise prescriptions  Reviews F.I.T.T. principles of resistance exercise including progression. Written material given at graduation.    Education: Exercise & Equipment Safety: - Individual verbal instruction and demonstration of equipment use and safety with use of the equipment.   Education: Exercise Physiology & General Exercise Guidelines: - Group verbal and written instruction with models to review the exercise physiology of the cardiovascular system and associated critical values. Provides general exercise guidelines with specific guidelines to those with heart or lung disease.    Education: Flexibility, Balance, Mind/Body Relaxation: - Group verbal and visual presentation with interactive activity on the components of exercise prescription. Introduces F.I.T.T principle from ACSM for exercise prescriptions. Reviews F.I.T.T. principles of flexibility and balance exercise training including progression. Also discusses the mind body connection.  Reviews various relaxation techniques to help reduce and manage  stress (i.e. Deep breathing, progressive muscle relaxation, and visualization). Balance handout provided to take home. Written material given at graduation.   Activity Barriers & Risk Stratification:  Activity Barriers & Cardiac Risk Stratification - 05/11/20 1158      Activity Barriers & Cardiac Risk Stratification   Activity Barriers Deconditioning;Muscular Weakness;Balance Concerns    Cardiac Risk Stratification High           6 Minute Walk:  6 Minute Walk    Row Name 05/11/20 1156         6 Minute Walk   Phase Initial     Distance 1435 feet     Walk Time 6 minutes     # of Rest Breaks 0     MPH 2.72     METS 3.22     RPE 15     Perceived Dyspnea  1     VO2 Peak 11.27     Symptoms Yes (comment)     Comments R leg calf pain and L thigh pain 4/10     Resting HR 76 bpm     Resting BP 132/70     Resting Oxygen Saturation  98 %     Exercise Oxygen Saturation  during 6 min walk 98 %     Max Ex. HR 106 bpm     Max Ex. BP 142/74     2 Minute Post BP 124/62            Oxygen Initial Assessment:   Oxygen Re-Evaluation:   Oxygen Discharge (Final Oxygen Re-Evaluation):   Initial Exercise Prescription:  Initial Exercise Prescription - 05/11/20 1100      Date of Initial Exercise RX and Referring Provider   Date 05/11/20    Referring Provider [name] MD      Treadmill   MPH 2.5    Grade 0.5    Minutes 15    METs 3.09      Recumbant Bike   Level 3    RPM 50    Watts 36    Minutes 15    METs 3      NuStep   Level 3    SPM 80  Minutes 15    METs 3      T5 Nustep   Level 3    SPM 80    Minutes 15    METs 3      Prescription Details   Frequency (times per week) 3    Duration Progress to 30 minutes of continuous aerobic without signs/symptoms of physical distress      Intensity   THRR 40-80% of Max Heartrate 105-134    Ratings of Perceived Exertion 11-13    Perceived Dyspnea 0-4      Progression   Progression Continue to progress  workloads to maintain intensity without signs/symptoms of physical distress.      Resistance Training   Training Prescription Yes    Weight 4 lb    Reps 10-15           Perform Capillary Blood Glucose checks as needed.  Exercise Prescription Changes:  Exercise Prescription Changes    Row Name 05/11/20 1100             Response to Exercise   Blood Pressure (Admit) 132/70       Blood Pressure (Exercise) 142/74       Blood Pressure (Exit) 124/60       Heart Rate (Admit) 76 bpm       Heart Rate (Exercise) 106 bpm       Heart Rate (Exit) 75 bpm       Oxygen Saturation (Admit) 98 %       Oxygen Saturation (Exercise) 98 %       Rating of Perceived Exertion (Exercise) 15       Perceived Dyspnea (Exercise) 1       Symptoms R calf pain, L thigh pain 4/10       Comments walk test results              Exercise Comments:   Exercise Goals and Review:  Exercise Goals    Row Name 05/11/20 1454             Exercise Goals   Increase Physical Activity Yes       Intervention Provide advice, education, support and counseling about physical activity/exercise needs.;Develop an individualized exercise prescription for aerobic and resistive training based on initial evaluation findings, risk stratification, comorbidities and participant's personal goals.       Expected Outcomes Short Term: Attend rehab on a regular basis to increase amount of physical activity.;Long Term: Exercising regularly at least 3-5 days a week.;Long Term: Add in home exercise to make exercise part of routine and to increase amount of physical activity.       Increase Strength and Stamina Yes       Intervention Provide advice, education, support and counseling about physical activity/exercise needs.;Develop an individualized exercise prescription for aerobic and resistive training based on initial evaluation findings, risk stratification, comorbidities and participant's personal goals.       Expected Outcomes  Short Term: Increase workloads from initial exercise prescription for resistance, speed, and METs.;Short Term: Perform resistance training exercises routinely during rehab and add in resistance training at home;Long Term: Improve cardiorespiratory fitness, muscular endurance and strength as measured by increased METs and functional capacity (6MWT)       Able to understand and use rate of perceived exertion (RPE) scale Yes       Intervention Provide education and explanation on how to use RPE scale       Expected Outcomes Short Term: Able to use RPE  daily in rehab to express subjective intensity level;Long Term:  Able to use RPE to guide intensity level when exercising independently       Able to understand and use Dyspnea scale Yes       Intervention Provide education and explanation on how to use Dyspnea scale       Expected Outcomes Short Term: Able to use Dyspnea scale daily in rehab to express subjective sense of shortness of breath during exertion;Long Term: Able to use Dyspnea scale to guide intensity level when exercising independently       Knowledge and understanding of Target Heart Rate Range (THRR) Yes       Intervention Provide education and explanation of THRR including how the numbers were predicted and where they are located for reference       Expected Outcomes Short Term: Able to state/look up THRR;Short Term: Able to use daily as guideline for intensity in rehab;Long Term: Able to use THRR to govern intensity when exercising independently       Able to check pulse independently Yes       Intervention Provide education and demonstration on how to check pulse in carotid and radial arteries.;Review the importance of being able to check your own pulse for safety during independent exercise       Expected Outcomes Short Term: Able to explain why pulse checking is important during independent exercise;Long Term: Able to check pulse independently and accurately       Understanding of Exercise  Prescription Yes       Intervention Provide education, explanation, and written materials on patient's individual exercise prescription       Expected Outcomes Short Term: Able to explain program exercise prescription;Long Term: Able to explain home exercise prescription to exercise independently              Exercise Goals Re-Evaluation :   Discharge Exercise Prescription (Final Exercise Prescription Changes):  Exercise Prescription Changes - 05/11/20 1100      Response to Exercise   Blood Pressure (Admit) 132/70    Blood Pressure (Exercise) 142/74    Blood Pressure (Exit) 124/60    Heart Rate (Admit) 76 bpm    Heart Rate (Exercise) 106 bpm    Heart Rate (Exit) 75 bpm    Oxygen Saturation (Admit) 98 %    Oxygen Saturation (Exercise) 98 %    Rating of Perceived Exertion (Exercise) 15    Perceived Dyspnea (Exercise) 1    Symptoms R calf pain, L thigh pain 4/10    Comments walk test results           Nutrition:  Target Goals: Understanding of nutrition guidelines, daily intake of sodium <1571m, cholesterol <2033m calories 30% from fat and 7% or less from saturated fats, daily to have 5 or more servings of fruits and vegetables.  Education: All About Nutrition: -Group instruction provided by verbal, written material, interactive activities, discussions, models, and posters to present general guidelines for heart healthy nutrition including fat, fiber, MyPlate, the role of sodium in heart healthy nutrition, utilization of the nutrition label, and utilization of this knowledge for meal planning. Follow up email sent as well. Written material given at graduation.   Biometrics:  Pre Biometrics - 05/11/20 1456      Pre Biometrics   Height 5' 11.1" (1.806 m)    Weight 205 lb 3.2 oz (93.1 kg)    BMI (Calculated) 28.54    Single Leg Stand 9.5 seconds  Nutrition Therapy Plan and Nutrition Goals:   Nutrition Assessments:  MEDIFICTS Score Key:  ?70 Need to  make dietary changes   40-70 Heart Healthy Diet  ? 40 Therapeutic Level Cholesterol Diet  Flowsheet Row Cardiac Rehab from 05/11/2020 in [facility] Cardiac and Pulmonary Rehab  Picture Your Plate Total Score on Admission 53     Picture Your Plate Scores:  <09 Unhealthy dietary pattern with much room for improvement.  41-50 Dietary pattern unlikely to meet recommendations for good health and room for improvement.  51-60 More healthful dietary pattern, with some room for improvement.   >60 Healthy dietary pattern, although there may be some specific behaviors that could be improved.    Nutrition Goals Re-Evaluation:   Nutrition Goals Discharge (Final Nutrition Goals Re-Evaluation):   Psychosocial: Target Goals: Acknowledge presence or absence of significant depression and/or stress, maximize coping skills, provide positive support system. Participant is able to verbalize types and ability to use techniques and skills needed for reducing stress and depression.   Education: Stress, Anxiety, and Depression - Group verbal and visual presentation to define topics covered.  Reviews how body is impacted by stress, anxiety, and depression.  Also discusses healthy ways to reduce stress and to treat/manage anxiety and depression.  Written material given at graduation.   Education: Sleep Hygiene -Provides group verbal and written instruction about how sleep can affect your health.  Define sleep hygiene, discuss sleep cycles and impact of sleep habits. Review good sleep hygiene tips.    Initial Review & Psychosocial Screening:  Initial Psych Review & Screening - 05/04/20 0849      Initial Review   Current issues with None Identified      Family Dynamics   Good Support System? Yes    Comments He can look to his wife, his friends Sonia Side and his kids sometimes. His family cares for him but he would rather support them. Kaius has a good outlook on his health.      Barriers   Psychosocial  barriers to participate in program The patient should benefit from training in stress management and relaxation.;There are no identifiable barriers or psychosocial needs.      Screening Interventions   Interventions Encouraged to exercise;To provide support and resources with identified psychosocial needs;Provide feedback about the scores to participant    Expected Outcomes Short Term goal: Utilizing psychosocial counselor, staff and physician to assist with identification of specific Stressors or current issues interfering with healing process. Setting desired goal for each stressor or current issue identified.;Long Term Goal: Stressors or current issues are controlled or eliminated.;Short Term goal: Identification and review with participant of any Quality of Life or Depression concerns found by scoring the questionnaire.;Long Term goal: The participant improves quality of Life and PHQ9 Scores as seen by post scores and/or verbalization of changes           Quality of Life Scores:   Quality of Life - 05/11/20 1456      Quality of Life   Select Quality of Life      Quality of Life Scores   Health/Function Pre 18.6 %    Socioeconomic Pre 23.44 %    Psych/Spiritual Pre 23.93 %    Family Pre 28.8 %    GLOBAL Pre 22.23 %          Scores of 19 and below usually indicate a poorer quality of life in these areas.  A difference of  2-3 points is a clinically meaningful difference.  A difference of 2-3 points in the total score of the Quality of Life Index has been associated with significant improvement in overall quality of life, self-image, physical symptoms, and general health in studies assessing change in quality of life.  PHQ-9: Recent Review Flowsheet Data    Depression screen [facility] 2/9 05/11/2020 09/10/2014   Decreased Interest 0 0   Down, Depressed, Hopeless 0 0   PHQ - 2 Score 0 0   Altered sleeping 3 0   Tired, decreased energy 1 0   Change in appetite 1 0   Feeling bad or failure  about yourself  0 0   Trouble concentrating 0 0   Moving slowly or fidgety/restless 0 0   Suicidal thoughts 0 0   PHQ-9 Score 5 0   Difficult doing work/chores Somewhat difficult -     Interpretation of Total Score  Total Score Depression Severity:  1-4 = Minimal depression, 5-9 = Mild depression, 10-14 = Moderate depression, 15-19 = Moderately severe depression, 20-27 = Severe depression   Psychosocial Evaluation and Intervention:  Psychosocial Evaluation - 05/04/20 0852      Psychosocial Evaluation & Interventions   Interventions Encouraged to exercise with the program and follow exercise prescription;Stress management education;Relaxation education    Comments He can look to his wife, his friends Sonia Side and his kids sometimes. His family cares for him but he would rather support them. Deano has a good outlook on his health.    Expected Outcomes Short: Exercise regularly to support mental health and notify staff of any changes. Long: maintain mental health and well being through teaching of rehab or prescribed medications independently.    Continue Psychosocial Services  Follow up required by staff           Psychosocial Re-Evaluation:   Psychosocial Discharge (Final Psychosocial Re-Evaluation):   Vocational Rehabilitation: Provide vocational rehab assistance to qualifying candidates.   Vocational Rehab Evaluation & Intervention:   Education: Education Goals: Education classes will be provided on a variety of topics geared toward better understanding of heart health and risk factor modification. Participant will state understanding/return demonstration of topics presented as noted by education test scores.  Learning Barriers/Preferences:  Learning Barriers/Preferences - 05/04/20 0848      Learning Barriers/Preferences   Learning Barriers None    Learning Preferences None           General Cardiac Education Topics:  AED/CPR: - Group verbal and written  instruction with the use of models to demonstrate the basic use of the AED with the basic ABC's of resuscitation.   Anatomy and Cardiac Procedures: - Group verbal and visual presentation and models provide information about basic cardiac anatomy and function. Reviews the testing methods done to diagnose heart disease and the outcomes of the test results. Describes the treatment choices: Medical Management, Angioplasty, or Coronary Bypass Surgery for treating various heart conditions including Myocardial Infarction, Angina, Valve Disease, and Cardiac Arrhythmias.  Written material given at graduation. Flowsheet Row Cardiac Rehab from 05/11/2020 in [facility] Cardiac and Pulmonary Rehab  Education need identified 05/11/20      Medication Safety: - Group verbal and visual instruction to review commonly prescribed medications for heart and lung disease. Reviews the medication, class of the drug, and side effects. Includes the steps to properly store meds and maintain the prescription regimen.  Written material given at graduation.   Intimacy: - Group verbal instruction through game format to discuss how heart and lung disease can affect sexual intimacy. Written  material given at graduation.. Flowsheet Row Cardiac Rehab from 09/01/2014 in [facility] Cardiac and Pulmonary Rehab  Date 09/01/14  Educator MA  Instruction Review Code (retired) 2- meets goals/outcomes      Know Your Numbers and Heart Failure: - Group verbal and visual instruction to discuss disease risk factors for cardiac and pulmonary disease and treatment options.  Reviews associated critical values for Overweight/Obesity, Hypertension, Cholesterol, and Diabetes.  Discusses basics of heart failure: signs/symptoms and treatments.  Introduces Heart Failure Zone chart for action plan for heart failure.  Written material given at graduation.   Infection Prevention: - Provides verbal and written material to individual with discussion of infection  control including proper hand washing and proper equipment cleaning during exercise session. Flowsheet Row Cardiac Rehab from 05/11/2020 in [facility] Cardiac and Pulmonary Rehab  Date 05/04/20  Educator [facility]  Instruction Review Code 1- Verbalizes Understanding      Falls Prevention: - Provides verbal and written material to individual with discussion of falls prevention and safety. Flowsheet Row Cardiac Rehab from 05/11/2020 in [facility] Cardiac and Pulmonary Rehab  Date 05/04/20  Educator [facility]  Instruction Review Code 1- Verbalizes Understanding      Other: -Provides group and verbal instruction on various topics (see comments)   Knowledge Questionnaire Score:  Knowledge Questionnaire Score - 05/11/20 1458      Knowledge Questionnaire Score   Pre Score 24/26 Education Focus: PAD, Exercise           Core Components/Risk Factors/Patient Goals at Admission:  Personal Goals and Risk Factors at Admission - 05/11/20 1458      Core Components/Risk Factors/Patient Goals on Admission    Weight Management Yes;Weight Loss;Obesity    Intervention Weight Management: Develop a combined nutrition and exercise program designed to reach desired caloric intake, while maintaining appropriate intake of nutrient and fiber, sodium and fats, and appropriate energy expenditure required for the weight goal.;Weight Management: Provide education and appropriate resources to help participant work on and attain dietary goals.;Weight Management/Obesity: Establish reasonable short term and long term weight goals.;Obesity: Provide education and appropriate resources to help participant work on and attain dietary goals.    Admit Weight 205 lb 3.2 oz (93.1 kg)    Goal Weight: Short Term 200 lb (90.7 kg)    Goal Weight: Long Term 195 lb (88.5 kg)    Expected Outcomes Short Term: Continue to assess and modify interventions until short term weight is achieved;Long Term: Adherence to nutrition and physical activity/exercise  program aimed toward attainment of established weight goal;Weight Loss: Understanding of general recommendations for a balanced deficit meal plan, which promotes 1-2 lb weight loss per week and includes a negative energy balance of [phone] kcal/d;Understanding recommendations for meals to include 15-35% energy as protein, 25-35% energy from fat, 35-60% energy from carbohydrates, less than 247m of dietary cholesterol, 20-35 gm of total fiber daily;Understanding of distribution of calorie intake throughout the day with the consumption of 4-5 meals/snacks    Diabetes Yes    Intervention Provide education about signs/symptoms and action to take for hypo/hyperglycemia.;Provide education about proper nutrition, including hydration, and aerobic/resistive exercise prescription along with prescribed medications to achieve blood glucose in normal ranges: Fasting glucose 65-99 mg/dL    Expected Outcomes Short Term: Participant verbalizes understanding of the signs/symptoms and immediate care of hyper/hypoglycemia, proper foot care and importance of medication, aerobic/resistive exercise and nutrition plan for blood glucose control.;Long Term: Attainment of HbA1C < 7%.    Hypertension Yes    Intervention Provide  education on lifestyle modifcations including regular physical activity/exercise, weight management, moderate sodium restriction and increased consumption of fresh fruit, vegetables, and low fat dairy, alcohol moderation, and smoking cessation.;Monitor prescription use compliance.    Expected Outcomes Short Term: Continued assessment and intervention until BP is < 140/72m HG in hypertensive participants. < 130/873mHG in hypertensive participants with diabetes, heart failure or chronic kidney disease.;Long Term: Maintenance of blood pressure at goal levels.    Lipids Yes    Intervention Provide education and support for participant on nutrition & aerobic/resistive exercise along with prescribed medications to  achieve LDL <7087mHDL >86m11m  Expected Outcomes Short Term: Participant states understanding of desired cholesterol values and is compliant with medications prescribed. Participant is following exercise prescription and nutrition guidelines.;Long Term: Cholesterol controlled with medications as prescribed, with individualized exercise RX and with personalized nutrition plan. Value goals: LDL < 70mg52mL > 40 mg.           Education:Diabetes - Individual verbal and written instruction to review signs/symptoms of diabetes, desired ranges of glucose level fasting, after meals and with exercise. Acknowledge that pre and post exercise glucose checks will be done for 3 sessions at entry of program. [place] [facility]  Date 05/04/20  Educator [facility] Review Code 1- Verbalizes Understanding      Core Components/Risk Factors/Patient Goals Review:    Core Components/Risk Factors/Patient Goals at Discharge (Final Review):    ITP Comments:  ITP Comments    Row Name 05/04/20 0854 05/11/20 1154         ITP Comments Virtual Visit completed. Patient informed on EP and RD appointment and 6 Minute walk test. Patient also informed of patient health questionnaires on My Chart. Patient Verbalizes understanding. Visit diagnosis can be found in [facility]. Completed 6MWT and gym orientation. Initial ITP created and sent for review to Dr. Mark Emily Filbertical Director.             Comments: Initial ITP

## 2020-05-11 NOTE — Patient Instructions (Signed)
Patient Instructions  Patient Details  Name: Jake Castro MRN: [id number] Date of Birth: [birth year] Referring Provider:  Laurier Nancy, MD  Below are your personal goals for exercise, nutrition, and risk factors. Our goal is to help you stay on track towards obtaining and maintaining these goals. We will be discussing your progress on these goals with you throughout the program.  Initial Exercise Prescription:  Initial Exercise Prescription - 05/11/20 1100      Date of Initial Exercise RX and Referring Provider   Date 05/11/20    Referring Provider Adrian Blackwater MD      Treadmill   MPH 2.5    Grade 0.5    Minutes 15    METs 3.09      Recumbant Bike   Level 3    RPM 50    Watts 36    Minutes 15    METs 3      NuStep   Level 3    SPM 80    Minutes 15    METs 3      T5 Nustep   Level 3    SPM 80    Minutes 15    METs 3      Prescription Details   Frequency (times per week) 3    Duration Progress to 30 minutes of continuous aerobic without signs/symptoms of physical distress      Intensity   THRR 40-80% of Max Heartrate 105-134    Ratings of Perceived Exertion 11-13    Perceived Dyspnea 0-4      Progression   Progression Continue to progress workloads to maintain intensity without signs/symptoms of physical distress.      Resistance Training   Training Prescription Yes    Weight 4 lb    Reps 10-15           Exercise Goals: Frequency: Be able to perform aerobic exercise two to three times per week in program working toward 2-5 days per week of home exercise.  Intensity: Work with a perceived exertion of 11 (fairly light) - 15 (hard) while following your exercise prescription.  We will make changes to your prescription with you as you progress through the program.   Duration: Be able to do 30 to 45 minutes of continuous aerobic exercise in addition to a 5 minute warm-up and a 5 minute cool-down routine.   Nutrition Goals: Your personal nutrition  goals will be established when you do your nutrition analysis with the dietician.  The following are general nutrition guidelines to follow: Cholesterol < 200mg /day Sodium < 1500mg /day Fiber: Men over 50 yrs - 30 grams per day  Personal Goals:  Personal Goals and Risk Factors at Admission - 05/11/20 1458      Core Components/Risk Factors/Patient Goals on Admission    Weight Management Yes;Weight Loss;Obesity    Intervention Weight Management: Develop a combined nutrition and exercise program designed to reach desired caloric intake, while maintaining appropriate intake of nutrient and fiber, sodium and fats, and appropriate energy expenditure required for the weight goal.;Weight Management: Provide education and appropriate resources to help participant work on and attain dietary goals.;Weight Management/Obesity: Establish reasonable short term and long term weight goals.;Obesity: Provide education and appropriate resources to help participant work on and attain dietary goals.    Admit Weight 205 lb 3.2 oz (93.1 kg)    Goal Weight: Short Term 200 lb (90.7 kg)    Goal Weight: Long Term 195 lb (88.5 kg)  Expected Outcomes Short Term: Continue to assess and modify interventions until short term weight is achieved;Long Term: Adherence to nutrition and physical activity/exercise program aimed toward attainment of established weight goal;Weight Loss: Understanding of general recommendations for a balanced deficit meal plan, which promotes 1-2 lb weight loss per week and includes a negative energy balance of [phone] kcal/d;Understanding recommendations for meals to include 15-35% energy as protein, 25-35% energy from fat, 35-60% energy from carbohydrates, less than 200mg  of dietary cholesterol, 20-35 gm of total fiber daily;Understanding of distribution of calorie intake throughout the day with the consumption of 4-5 meals/snacks    Diabetes Yes    Intervention Provide education about signs/symptoms  and action to take for hypo/hyperglycemia.;Provide education about proper nutrition, including hydration, and aerobic/resistive exercise prescription along with prescribed medications to achieve blood glucose in normal ranges: Fasting glucose 65-99 mg/dL    Expected Outcomes Short Term: Participant verbalizes understanding of the signs/symptoms and immediate care of hyper/hypoglycemia, proper foot care and importance of medication, aerobic/resistive exercise and nutrition plan for blood glucose control.;Long Term: Attainment of HbA1C < 7%.    Hypertension Yes    Intervention Provide education on lifestyle modifcations including regular physical activity/exercise, weight management, moderate sodium restriction and increased consumption of fresh fruit, vegetables, and low fat dairy, alcohol moderation, and smoking cessation.;Monitor prescription use compliance.    Expected Outcomes Short Term: Continued assessment and intervention until BP is < 140/33mm HG in hypertensive participants. < 130/9mm HG in hypertensive participants with diabetes, heart failure or chronic kidney disease.;Long Term: Maintenance of blood pressure at goal levels.    Lipids Yes    Intervention Provide education and support for participant on nutrition & aerobic/resistive exercise along with prescribed medications to achieve LDL 70mg , HDL >40mg .    Expected Outcomes Short Term: Participant states understanding of desired cholesterol values and is compliant with medications prescribed. Participant is following exercise prescription and nutrition guidelines.;Long Term: Cholesterol controlled with medications as prescribed, with individualized exercise RX and with personalized nutrition plan. Value goals: LDL < 70mg , HDL > 40 mg.           Tobacco Use Initial Evaluation: Social History   Tobacco Use  Smoking Status Former Smoker  . Packs/day: 0.50  . Years: 1.00  . Pack years: 0.50  . Types: Cigarettes  . Quit date:  05/04/1965  . Years since quitting: 55.0  Smokeless Tobacco Never Used  Tobacco Comment   smoked for a few months in his early 20's.    Exercise Goals and Review:  Exercise Goals    Row Name 05/11/20 1454             Exercise Goals   Increase Physical Activity Yes       Intervention Provide advice, education, support and counseling about physical activity/exercise needs.;Develop an individualized exercise prescription for aerobic and resistive training based on initial evaluation findings, risk stratification, comorbidities and participant's personal goals.       Expected Outcomes Short Term: Attend rehab on a regular basis to increase amount of physical activity.;Long Term: Exercising regularly at least 3-5 days a week.;Long Term: Add in home exercise to make exercise part of routine and to increase amount of physical activity.       Increase Strength and Stamina Yes       Intervention Provide advice, education, support and counseling about physical activity/exercise needs.;Develop an individualized exercise prescription for aerobic and resistive training based on initial evaluation findings, risk stratification, comorbidities and participant's personal goals.  Expected Outcomes Short Term: Increase workloads from initial exercise prescription for resistance, speed, and METs.;Short Term: Perform resistance training exercises routinely during rehab and add in resistance training at home;Long Term: Improve cardiorespiratory fitness, muscular endurance and strength as measured by increased METs and functional capacity ( )       Able to understand and use rate of perceived exertion (RPE) scale Yes       Intervention Provide education and explanation on how to use RPE scale       Expected Outcomes Short Term: Able to use RPE daily in rehab to express subjective intensity level;Long Term:  Able to use RPE to guide intensity level when exercising independently       Able to understand and  use Dyspnea scale Yes       Intervention Provide education and explanation on how to use Dyspnea scale       Expected Outcomes Short Term: Able to use Dyspnea scale daily in rehab to express subjective sense of shortness of breath during exertion;Long Term: Able to use Dyspnea scale to guide intensity level when exercising independently       Knowledge and understanding of Target Heart Rate Range (THRR) Yes       Intervention Provide education and explanation of THRR including how the numbers were predicted and where they are located for reference       Expected Outcomes Short Term: Able to state/look up THRR;Short Term: Able to use daily as guideline for intensity in rehab;Long Term: Able to use THRR to govern intensity when exercising independently       Able to check pulse independently Yes       Intervention Provide education and demonstration on how to check pulse in carotid and radial arteries.;Review the importance of being able to check your own pulse for safety during independent exercise       Expected Outcomes Short Term: Able to explain why pulse checking is important during independent exercise;Long Term: Able to check pulse independently and accurately       Understanding of Exercise Prescription Yes       Intervention Provide education, explanation, and written materials on patient's individual exercise prescription       Expected Outcomes Short Term: Able to explain program exercise prescription;Long Term: Able to explain home exercise prescription to exercise independently              Copy of goals given to participant.

## 2020-05-12 ENCOUNTER — Other Ambulatory Visit: Payer: Self-pay

## 2020-05-12 ENCOUNTER — Encounter: Payer: Self-pay | Admitting: *Deleted

## 2020-05-12 DIAGNOSIS — I214 Non-ST elevation (NSTEMI) myocardial infarction: Secondary | ICD-10-CM

## 2020-05-12 LAB — GLUCOSE, CAPILLARY
Glucose-Capillary: 100 mg/dL — ABNORMAL HIGH (ref 70–99)
Glucose-Capillary: 100 mg/dL — ABNORMAL HIGH (ref 70–99)

## 2020-05-12 NOTE — Progress Notes (Signed)
Cardiac Individual Treatment Plan  Patient Details  Name: Jake Castro MRN: [id number] Date of Birth: 1950/10/22 Referring Provider:   Flowsheet Row Cardiac Rehab from 05/11/2020 in [facility] Cardiac and Pulmonary Rehab  Referring Provider [name] MD      Initial Encounter Date:  Flowsheet Row Cardiac Rehab from 05/11/2020 in [facility] Cardiac and Pulmonary Rehab  Date 05/11/20      Visit Diagnosis: NSTEMI (non-ST elevated myocardial infarction) [facility])  Patient's Home Medications on Admission:  Current Outpatient Medications:  .  acetaminophen (TYLENOL) 325 MG tablet, Take 2 tablets (650 mg total) by mouth every 6 (six) hours as needed for mild pain (or Fever >/= 101). (Patient not taking: Reported on 05/04/2020), Disp: , Rfl:  .  Ascorbic Acid (VITAMIN C) 1000 MG tablet, Take 1,000 mg by mouth daily. (Patient not taking: No sig reported), Disp: , Rfl:  .  aspirin 325 MG tablet, Take by mouth., Disp: , Rfl:  .  aspirin 81 MG tablet, Take 81 mg by mouth daily. Am (Patient not taking: No sig reported), Disp: , Rfl:  .  Blood Glucose Monitoring Suppl (ONETOUCH VERIO FLEX SYSTEM) w/Device KIT, U UTD TO TEST BLOOD SUGAR, Disp: , Rfl:  .  carvedilol (COREG) 12.5 MG tablet, Take 12.5 mg by mouth 2 (two) times daily with a meal., Disp: , Rfl:  .  chlorhexidine (PERIDEX) 0.12 % solution, chlorhexidine gluconate 0.12 % mouthwash  RINSE AND SPIT WITH 1/2 OZ FOR 30 SECONDS BID FOR 2 WEEKS, Disp: , Rfl:  .  Cholecalciferol (VITAMIN D-3) 5000 UNITS TABS, Take 5,000 mg by mouth daily., Disp: , Rfl:  .  Exenatide ER 2 MG SRER, Inject into the skin once a week. (Patient not taking: Reported on 05/04/2020), Disp: , Rfl:  .  fenofibrate (TRICOR) 145 MG tablet, Take 145 mg by mouth daily., Disp: , Rfl:  .  ferrous sulfate 325 (65 FE) MG tablet, Take by mouth. (Patient not taking: Reported on 05/04/2020), Disp: , Rfl:  .  glucose blood (ONETOUCH ULTRA) test strip, U UTD TO TEST BLOOD SUGAR (Patient not taking:  Reported on 05/04/2020), Disp: , Rfl:  .  glucose blood (PRECISION QID TEST) test strip, U UTD TO TEST BLOOD SUGAR (Patient not taking: Reported on 05/04/2020), Disp: , Rfl:  .  isosorbide mononitrate (IMDUR) 30 MG 24 hr tablet, Take 1 tablet (30 mg total) by mouth daily., Disp: 30 tablet, Rfl: 0 .  KRILL OIL PO, Take 1,000 mg by mouth daily.  (Patient not taking: No sig reported), Disp: , Rfl:  .  l-methylfolate-B6-B12 (METANX) 3-35-2 MG TABS tablet, Take 1 tablet by mouth daily. am (Patient not taking: No sig reported), Disp: , Rfl:  .  lisinopril (PRINIVIL,ZESTRIL) 40 MG tablet, Take 40 mg by mouth daily. am, Disp: , Rfl:  .  metFORMIN (GLUCOPHAGE) 1000 MG tablet, Take 1,000 mg by mouth 2 (two) times daily with a meal. Am and bedtime, Disp: , Rfl:  .  Methylcobalamin 1 MG CHEW, Chew by mouth. (Patient not taking: No sig reported), Disp: , Rfl:  .  Multiple Vitamin (MULTIVITAMIN) capsule, Take 1 capsule by mouth daily. (Patient not taking: No sig reported), Disp: , Rfl:  .  Omega-3 Fatty Acids (FISH OIL) 1000 MG CAPS, omega-3 acid ethyl esters 1 gram capsule, Disp: , Rfl:  .  rosuvastatin (CRESTOR) 40 MG tablet, Take 1 tablet (40 mg total) by mouth daily., Disp: 30 tablet, Rfl: 0 .  Semaglutide,0.25 or 0.5MG /DOS, (OZEMPIC, 0.25 OR 0.5  MG/DOSE,) 2 MG/1.5ML SOPN, Ozempic 0.25 mg or 0.5 mg (2 mg/1.5 mL) subcutaneous pen injector, Disp: , Rfl:  .  Specialty Vitamins Products (PROSTATE PO), Take by mouth daily. B6 70m and zinc 356m(Patient not taking: No sig reported), Disp: , Rfl:  .  ticagrelor (BRILINTA) 90 MG TABS tablet, Take 1 tablet (90 mg total) by mouth 2 (two) times daily., Disp: 60 tablet, Rfl: 0 .  triamcinolone cream (KENALOG) 0.5 %, triamcinolone acetonide 0.5 % topical cream, Disp: , Rfl:  .  Ubiquinol 200 MG CAPS, Take by mouth daily. (Patient not taking: No sig reported), Disp: , Rfl:  .  VITAMIN K PO, Take 1 tablet by mouth daily., Disp: , Rfl:  No current facility-administered  medications for this visit.  Facility-Administered Medications Ordered in Other Visits:  .  sodium chloride flush (NS) 0.9 % injection 3 mL, 3 mL, Intravenous, Q12H, KhDionisio DavidMD  Past Medical History: Past Medical History:  Diagnosis Date  . CAD (coronary artery disease)    a. 12/2006 NSTEMI/PCI: RCA 9553m>mid vessel stented, unable to cross dist dzs w/ balloon;  b. 11/2013 NL MV;  c. 11/2013 Echo: EF 55%;  d. 12/2013 Cath: LM 95 ost, LAD 70p, 70m69mX 30p/m, OM1 60, RCA 20p, 8m/29m 95d.  . Carotid arterial disease (HCC) [place]. s/p LCEA  . Diabetes mellitus (HCC) [place]  . HTN (hypertension)   . Hyperlipidemia   . Hypertension   . Morbid obesity (HCC) [place] Myocardial infarction (HCC) [place] Neuromuscular disorder (HCC)    neuropathy left foot  . Neuropathy    LEFT FOOT  . Plantar fasciitis   . Pneumonia   . Wears dentures    full upper and lower partial    Tobacco Use: Social History   Tobacco Use  Smoking Status Former Smoker  . Packs/day: 0.50  . Years: 1.00  . Pack years: 0.50  . Types: Cigarettes  . Quit date: 05/04/1965  . Years since quitting: 55.0  Smokeless Tobacco Never Used  Tobacco Comment   smoked for a few months in his early 20's.    Labs: Recent Review Flowsheet Data    Labs for ITP Cardiac and Pulmonary Rehab Latest Ref Rng & Units 01/03/2014 01/03/2014 01/04/2014 04/21/2020 04/22/2020   Cholestrol 0 - 200 mg/dL - - - - 121   LDLCALC 0 - 99 mg/dL - - - - 46   HDL >40 mg/dL - - - - 46   Trlycerides <150 mg/dL - - - - 146   Hemoglobin A1c 4.8 - 5.6 % - - - 6.7(H) -   PHART 7.350 - 7.450 7.344(L) - - - -   PCO2ART 35.0 - 45.0 mmHg 52.4(H) - - - -   HCO3 20.0 - 28.0 mmol/L 28.5(H) - - 29.2(H) -   TCO2 0 - 100 mmol/L _0 - -   ACIDBASEDEF 0.0 - 2.0 mmol/L - - - - -   O2SAT % 95.0 - - 60.8 -       Exercise Target Goals: Exercise Program Goal: Individual exercise prescription set using results from initial 6 min walk test and THRR while  considering  patient's activity barriers and safety.   Exercise Prescription Goal: Initial exercise prescription builds to 30-45 minutes a day of aerobic activity, 2-3 days per week.  Home exercise guidelines will be given to patient during program as part of exercise prescription that the participant will acknowledge.  Education: Aerobic Exercise: - Group verbal and visual presentation on the components of exercise prescription. Introduces F.I.T.T principle from ACSM for exercise prescriptions.  Reviews F.I.T.T. principles of aerobic exercise including progression. Written material given at graduation. Flowsheet Row Cardiac Rehab from 05/11/2020 in [facility] Cardiac and Pulmonary Rehab  Education need identified 05/11/20      Education: Resistance Exercise: - Group verbal and visual presentation on the components of exercise prescription. Introduces F.I.T.T principle from ACSM for exercise prescriptions  Reviews F.I.T.T. principles of resistance exercise including progression. Written material given at graduation.    Education: Exercise & Equipment Safety: - Individual verbal instruction and demonstration of equipment use and safety with use of the equipment.   Education: Exercise Physiology & General Exercise Guidelines: - Group verbal and written instruction with models to review the exercise physiology of the cardiovascular system and associated critical values. Provides general exercise guidelines with specific guidelines to those with heart or lung disease.    Education: Flexibility, Balance, Mind/Body Relaxation: - Group verbal and visual presentation with interactive activity on the components of exercise prescription. Introduces F.I.T.T principle from ACSM for exercise prescriptions. Reviews F.I.T.T. principles of flexibility and balance exercise training including progression. Also discusses the mind body connection.  Reviews various relaxation techniques to help reduce and manage  stress (i.e. Deep breathing, progressive muscle relaxation, and visualization). Balance handout provided to take home. Written material given at graduation.   Activity Barriers & Risk Stratification:  Activity Barriers & Cardiac Risk Stratification - 05/11/20 1158      Activity Barriers & Cardiac Risk Stratification   Activity Barriers Deconditioning;Muscular Weakness;Balance Concerns    Cardiac Risk Stratification High           6 Minute Walk:  6 Minute Walk    Row Name 05/11/20 1156         6 Minute Walk   Phase Initial     Distance 1435 feet     Walk Time 6 minutes     # of Rest Breaks 0     MPH 2.72     METS 3.22     RPE 15     Perceived Dyspnea  1     VO2 Peak 11.27     Symptoms Yes (comment)     Comments R leg calf pain and L thigh pain 4/10     Resting HR 76 bpm     Resting BP 132/70     Resting Oxygen Saturation  98 %     Exercise Oxygen Saturation  during 6 min walk 98 %     Max Ex. HR 106 bpm     Max Ex. BP 142/74     2 Minute Post BP 124/62            Oxygen Initial Assessment:   Oxygen Re-Evaluation:   Oxygen Discharge (Final Oxygen Re-Evaluation):   Initial Exercise Prescription:  Initial Exercise Prescription - 05/11/20 1100      Date of Initial Exercise RX and Referring Provider   Date 05/11/20    Referring Provider [name] MD      Treadmill   MPH 2.5    Grade 0.5    Minutes 15    METs 3.09      Recumbant Bike   Level 3    RPM 50    Watts 36    Minutes 15    METs 3      NuStep   Level 3    SPM 80  Minutes 15    METs 3      T5 Nustep   Level 3    SPM 80    Minutes 15    METs 3      Prescription Details   Frequency (times per week) 3    Duration Progress to 30 minutes of continuous aerobic without signs/symptoms of physical distress      Intensity   THRR 40-80% of Max Heartrate 105-134    Ratings of Perceived Exertion 11-13    Perceived Dyspnea 0-4      Progression   Progression Continue to progress  workloads to maintain intensity without signs/symptoms of physical distress.      Resistance Training   Training Prescription Yes    Weight 4 lb    Reps 10-15           Perform Capillary Blood Glucose checks as needed.  Exercise Prescription Changes:  Exercise Prescription Changes    Row Name 05/11/20 1100             Response to Exercise   Blood Pressure (Admit) 132/70       Blood Pressure (Exercise) 142/74       Blood Pressure (Exit) 124/60       Heart Rate (Admit) 76 bpm       Heart Rate (Exercise) 106 bpm       Heart Rate (Exit) 75 bpm       Oxygen Saturation (Admit) 98 %       Oxygen Saturation (Exercise) 98 %       Rating of Perceived Exertion (Exercise) 15       Perceived Dyspnea (Exercise) 1       Symptoms R calf pain, L thigh pain 4/10       Comments walk test results              Exercise Comments:   Exercise Goals and Review:  Exercise Goals    Row Name 05/11/20 1454             Exercise Goals   Increase Physical Activity Yes       Intervention Provide advice, education, support and counseling about physical activity/exercise needs.;Develop an individualized exercise prescription for aerobic and resistive training based on initial evaluation findings, risk stratification, comorbidities and participant's personal goals.       Expected Outcomes Short Term: Attend rehab on a regular basis to increase amount of physical activity.;Long Term: Exercising regularly at least 3-5 days a week.;Long Term: Add in home exercise to make exercise part of routine and to increase amount of physical activity.       Increase Strength and Stamina Yes       Intervention Provide advice, education, support and counseling about physical activity/exercise needs.;Develop an individualized exercise prescription for aerobic and resistive training based on initial evaluation findings, risk stratification, comorbidities and participant's personal goals.       Expected Outcomes  Short Term: Increase workloads from initial exercise prescription for resistance, speed, and METs.;Short Term: Perform resistance training exercises routinely during rehab and add in resistance training at home;Long Term: Improve cardiorespiratory fitness, muscular endurance and strength as measured by increased METs and functional capacity (6MWT)       Able to understand and use rate of perceived exertion (RPE) scale Yes       Intervention Provide education and explanation on how to use RPE scale       Expected Outcomes Short Term: Able to use RPE  daily in rehab to express subjective intensity level;Long Term:  Able to use RPE to guide intensity level when exercising independently       Able to understand and use Dyspnea scale Yes       Intervention Provide education and explanation on how to use Dyspnea scale       Expected Outcomes Short Term: Able to use Dyspnea scale daily in rehab to express subjective sense of shortness of breath during exertion;Long Term: Able to use Dyspnea scale to guide intensity level when exercising independently       Knowledge and understanding of Target Heart Rate Range (THRR) Yes       Intervention Provide education and explanation of THRR including how the numbers were predicted and where they are located for reference       Expected Outcomes Short Term: Able to state/look up THRR;Short Term: Able to use daily as guideline for intensity in rehab;Long Term: Able to use THRR to govern intensity when exercising independently       Able to check pulse independently Yes       Intervention Provide education and demonstration on how to check pulse in carotid and radial arteries.;Review the importance of being able to check your own pulse for safety during independent exercise       Expected Outcomes Short Term: Able to explain why pulse checking is important during independent exercise;Long Term: Able to check pulse independently and accurately       Understanding of Exercise  Prescription Yes       Intervention Provide education, explanation, and written materials on patient's individual exercise prescription       Expected Outcomes Short Term: Able to explain program exercise prescription;Long Term: Able to explain home exercise prescription to exercise independently              Exercise Goals Re-Evaluation :   Discharge Exercise Prescription (Final Exercise Prescription Changes):  Exercise Prescription Changes - 05/11/20 1100      Response to Exercise   Blood Pressure (Admit) 132/70    Blood Pressure (Exercise) 142/74    Blood Pressure (Exit) 124/60    Heart Rate (Admit) 76 bpm    Heart Rate (Exercise) 106 bpm    Heart Rate (Exit) 75 bpm    Oxygen Saturation (Admit) 98 %    Oxygen Saturation (Exercise) 98 %    Rating of Perceived Exertion (Exercise) 15    Perceived Dyspnea (Exercise) 1    Symptoms R calf pain, L thigh pain 4/10    Comments walk test results           Nutrition:  Target Goals: Understanding of nutrition guidelines, daily intake of sodium <1580m, cholesterol <2069m calories 30% from fat and 7% or less from saturated fats, daily to have 5 or more servings of fruits and vegetables.  Education: All About Nutrition: -Group instruction provided by verbal, written material, interactive activities, discussions, models, and posters to present general guidelines for heart healthy nutrition including fat, fiber, MyPlate, the role of sodium in heart healthy nutrition, utilization of the nutrition label, and utilization of this knowledge for meal planning. Follow up email sent as well. Written material given at graduation.   Biometrics:  Pre Biometrics - 05/11/20 1456      Pre Biometrics   Height 5' 11.1" (1.806 m)    Weight 205 lb 3.2 oz (93.1 kg)    BMI (Calculated) 28.54    Single Leg Stand 9.5 seconds  Nutrition Therapy Plan and Nutrition Goals:   Nutrition Assessments:  MEDIFICTS Score Key:  ?70 Need to  make dietary changes   40-70 Heart Healthy Diet  ? 40 Therapeutic Level Cholesterol Diet  Flowsheet Row Cardiac Rehab from 05/11/2020 in [facility] Cardiac and Pulmonary Rehab  Picture Your Plate Total Score on Admission 53     Picture Your Plate Scores:  <56 Unhealthy dietary pattern with much room for improvement.  41-50 Dietary pattern unlikely to meet recommendations for good health and room for improvement.  51-60 More healthful dietary pattern, with some room for improvement.   >60 Healthy dietary pattern, although there may be some specific behaviors that could be improved.    Nutrition Goals Re-Evaluation:   Nutrition Goals Discharge (Final Nutrition Goals Re-Evaluation):   Psychosocial: Target Goals: Acknowledge presence or absence of significant depression and/or stress, maximize coping skills, provide positive support system. Participant is able to verbalize types and ability to use techniques and skills needed for reducing stress and depression.   Education: Stress, Anxiety, and Depression - Group verbal and visual presentation to define topics covered.  Reviews how body is impacted by stress, anxiety, and depression.  Also discusses healthy ways to reduce stress and to treat/manage anxiety and depression.  Written material given at graduation.   Education: Sleep Hygiene -Provides group verbal and written instruction about how sleep can affect your health.  Define sleep hygiene, discuss sleep cycles and impact of sleep habits. Review good sleep hygiene tips.    Initial Review & Psychosocial Screening:  Initial Psych Review & Screening - 05/04/20 0849      Initial Review   Current issues with None Identified      Family Dynamics   Good Support System? Yes    Comments He can look to his wife, his friends Sonia Side and his kids sometimes. His family cares for him but he would rather support them. Fahed has a good outlook on his health.      Barriers   Psychosocial  barriers to participate in program The patient should benefit from training in stress management and relaxation.;There are no identifiable barriers or psychosocial needs.      Screening Interventions   Interventions Encouraged to exercise;To provide support and resources with identified psychosocial needs;Provide feedback about the scores to participant    Expected Outcomes Short Term goal: Utilizing psychosocial counselor, staff and physician to assist with identification of specific Stressors or current issues interfering with healing process. Setting desired goal for each stressor or current issue identified.;Long Term Goal: Stressors or current issues are controlled or eliminated.;Short Term goal: Identification and review with participant of any Quality of Life or Depression concerns found by scoring the questionnaire.;Long Term goal: The participant improves quality of Life and PHQ9 Scores as seen by post scores and/or verbalization of changes           Quality of Life Scores:   Quality of Life - 05/11/20 1456      Quality of Life   Select Quality of Life      Quality of Life Scores   Health/Function Pre 18.6 %    Socioeconomic Pre 23.44 %    Psych/Spiritual Pre 23.93 %    Family Pre 28.8 %    GLOBAL Pre 22.23 %          Scores of 19 and below usually indicate a poorer quality of life in these areas.  A difference of  2-3 points is a clinically meaningful difference.  A difference of 2-3 points in the total score of the Quality of Life Index has been associated with significant improvement in overall quality of life, self-image, physical symptoms, and general health in studies assessing change in quality of life.  PHQ-9: Recent Review Flowsheet Data    Depression screen [facility] 2/9 05/11/2020 09/10/2014   Decreased Interest 0 0   Down, Depressed, Hopeless 0 0   PHQ - 2 Score 0 0   Altered sleeping 3 0   Tired, decreased energy 1 0   Change in appetite 1 0   Feeling bad or failure  about yourself  0 0   Trouble concentrating 0 0   Moving slowly or fidgety/restless 0 0   Suicidal thoughts 0 0   PHQ-9 Score 5 0   Difficult doing work/chores Somewhat difficult -     Interpretation of Total Score  Total Score Depression Severity:  1-4 = Minimal depression, 5-9 = Mild depression, 10-14 = Moderate depression, 15-19 = Moderately severe depression, 20-27 = Severe depression   Psychosocial Evaluation and Intervention:  Psychosocial Evaluation - 05/04/20 0852      Psychosocial Evaluation & Interventions   Interventions Encouraged to exercise with the program and follow exercise prescription;Stress management education;Relaxation education    Comments He can look to his wife, his friends Sonia Side and his kids sometimes. His family cares for him but he would rather support them. Jermain has a good outlook on his health.    Expected Outcomes Short: Exercise regularly to support mental health and notify staff of any changes. Long: maintain mental health and well being through teaching of rehab or prescribed medications independently.    Continue Psychosocial Services  Follow up required by staff           Psychosocial Re-Evaluation:   Psychosocial Discharge (Final Psychosocial Re-Evaluation):   Vocational Rehabilitation: Provide vocational rehab assistance to qualifying candidates.   Vocational Rehab Evaluation & Intervention:   Education: Education Goals: Education classes will be provided on a variety of topics geared toward better understanding of heart health and risk factor modification. Participant will state understanding/return demonstration of topics presented as noted by education test scores.  Learning Barriers/Preferences:  Learning Barriers/Preferences - 05/04/20 0848      Learning Barriers/Preferences   Learning Barriers None    Learning Preferences None           General Cardiac Education Topics:  AED/CPR: - Group verbal and written  instruction with the use of models to demonstrate the basic use of the AED with the basic ABC's of resuscitation.   Anatomy and Cardiac Procedures: - Group verbal and visual presentation and models provide information about basic cardiac anatomy and function. Reviews the testing methods done to diagnose heart disease and the outcomes of the test results. Describes the treatment choices: Medical Management, Angioplasty, or Coronary Bypass Surgery for treating various heart conditions including Myocardial Infarction, Angina, Valve Disease, and Cardiac Arrhythmias.  Written material given at graduation. Flowsheet Row Cardiac Rehab from 05/11/2020 in [facility] Cardiac and Pulmonary Rehab  Education need identified 05/11/20      Medication Safety: - Group verbal and visual instruction to review commonly prescribed medications for heart and lung disease. Reviews the medication, class of the drug, and side effects. Includes the steps to properly store meds and maintain the prescription regimen.  Written material given at graduation.   Intimacy: - Group verbal instruction through game format to discuss how heart and lung disease can affect sexual intimacy. Written  material given at graduation.. Flowsheet Row Cardiac Rehab from 09/01/2014 in [facility] Cardiac and Pulmonary Rehab  Date 09/01/14  Educator MA  Instruction Review Code (retired) 2- meets goals/outcomes      Know Your Numbers and Heart Failure: - Group verbal and visual instruction to discuss disease risk factors for cardiac and pulmonary disease and treatment options.  Reviews associated critical values for Overweight/Obesity, Hypertension, Cholesterol, and Diabetes.  Discusses basics of heart failure: signs/symptoms and treatments.  Introduces Heart Failure Zone chart for action plan for heart failure.  Written material given at graduation.   Infection Prevention: - Provides verbal and written material to individual with discussion of infection  control including proper hand washing and proper equipment cleaning during exercise session. Flowsheet Row Cardiac Rehab from 05/11/2020 in [facility] Cardiac and Pulmonary Rehab  Date 05/04/20  Educator [facility]  Instruction Review Code 1- Verbalizes Understanding      Falls Prevention: - Provides verbal and written material to individual with discussion of falls prevention and safety. Flowsheet Row Cardiac Rehab from 05/11/2020 in [facility] Cardiac and Pulmonary Rehab  Date 05/04/20  Educator [facility]  Instruction Review Code 1- Verbalizes Understanding      Other: -Provides group and verbal instruction on various topics (see comments)   Knowledge Questionnaire Score:  Knowledge Questionnaire Score - 05/11/20 1458      Knowledge Questionnaire Score   Pre Score 24/26 Education Focus: PAD, Exercise           Core Components/Risk Factors/Patient Goals at Admission:  Personal Goals and Risk Factors at Admission - 05/11/20 1458      Core Components/Risk Factors/Patient Goals on Admission    Weight Management Yes;Weight Loss;Obesity    Intervention Weight Management: Develop a combined nutrition and exercise program designed to reach desired caloric intake, while maintaining appropriate intake of nutrient and fiber, sodium and fats, and appropriate energy expenditure required for the weight goal.;Weight Management: Provide education and appropriate resources to help participant work on and attain dietary goals.;Weight Management/Obesity: Establish reasonable short term and long term weight goals.;Obesity: Provide education and appropriate resources to help participant work on and attain dietary goals.    Admit Weight 205 lb 3.2 oz (93.1 kg)    Goal Weight: Short Term 200 lb (90.7 kg)    Goal Weight: Long Term 195 lb (88.5 kg)    Expected Outcomes Short Term: Continue to assess and modify interventions until short term weight is achieved;Long Term: Adherence to nutrition and physical activity/exercise  program aimed toward attainment of established weight goal;Weight Loss: Understanding of general recommendations for a balanced deficit meal plan, which promotes 1-2 lb weight loss per week and includes a negative energy balance of [phone] kcal/d;Understanding recommendations for meals to include 15-35% energy as protein, 25-35% energy from fat, 35-60% energy from carbohydrates, less than $RemoveB'200mg'MyZDczhL$  of dietary cholesterol, 20-35 gm of total fiber daily;Understanding of distribution of calorie intake throughout the day with the consumption of 4-5 meals/snacks    Diabetes Yes    Intervention Provide education about signs/symptoms and action to take for hypo/hyperglycemia.;Provide education about proper nutrition, including hydration, and aerobic/resistive exercise prescription along with prescribed medications to achieve blood glucose in normal ranges: Fasting glucose 65-99 mg/dL    Expected Outcomes Short Term: Participant verbalizes understanding of the signs/symptoms and immediate care of hyper/hypoglycemia, proper foot care and importance of medication, aerobic/resistive exercise and nutrition plan for blood glucose control.;Long Term: Attainment of HbA1C < 7%.    Hypertension Yes    Intervention Provide  education on lifestyle modifcations including regular physical activity/exercise, weight management, moderate sodium restriction and increased consumption of fresh fruit, vegetables, and low fat dairy, alcohol moderation, and smoking cessation.;Monitor prescription use compliance.    Expected Outcomes Short Term: Continued assessment and intervention until BP is < 140/39m HG in hypertensive participants. < 130/823mHG in hypertensive participants with diabetes, heart failure or chronic kidney disease.;Long Term: Maintenance of blood pressure at goal levels.    Lipids Yes    Intervention Provide education and support for participant on nutrition & aerobic/resistive exercise along with prescribed medications to  achieve LDL <7043mHDL >39m71m  Expected Outcomes Short Term: Participant states understanding of desired cholesterol values and is compliant with medications prescribed. Participant is following exercise prescription and nutrition guidelines.;Long Term: Cholesterol controlled with medications as prescribed, with individualized exercise RX and with personalized nutrition plan. Value goals: LDL < 70mg20mL > 40 mg.           Education:Diabetes - Individual verbal and written instruction to review signs/symptoms of diabetes, desired ranges of glucose level fasting, after meals and with exercise. Acknowledge that pre and post exercise glucose checks will be done for 3 sessions at entry of program. [place] [facility]  Date 05/04/20  Educator [facility] Code 1- Verbalizes Understanding      Core Components/Risk Factors/Patient Goals Review:    Core Components/Risk Factors/Patient Goals at Discharge (Final Review):    ITP Comments:  ITP Comments    [place] 05/04/20 0854 05/11/20 1154 05/12/20 0653       ITP Comments Virtual Visit completed. Patient informed on EP and RD appointment and 6 Minute walk test. Patient also informed of patient health questionnaires on My Chart. Patient Verbalizes understanding. Visit diagnosis can be found in [facility]. Completed 6MWT and gym orientation. Initial ITP created and sent for review to Dr. Mark Emily Filbertical Director. 30 Day review completed. Medical Director ITP review done, changes made as directed, and signed approval by Medical Director.  New to program            Comments:

## 2020-05-12 NOTE — Progress Notes (Signed)
Daily Session Note  Patient Details  Name: Jake Castro MRN: [id number] Date of Birth: 12/19/1950 Referring Provider:   Flowsheet Row Cardiac Rehab from 05/11/2020 in [facility] Cardiac and Pulmonary Rehab  Referring Provider [name] MD      Encounter Date: 05/12/2020  Check In:  Session Check In - 05/12/20 0738      Check-In   Supervising physician immediately available to respond to emergencies See telemetry face sheet for immediately available ER MD    Location [facility]-Cardiac & Pulmonary Rehab    Staff Present Birdie Sons, MPA, Elveria Rising, BA, ACSM CEP, Exercise Physiologist;Joseph Tessie Fass RCP,RRT,BSRT    Virtual Visit No    Medication changes reported     No    Fall or balance concerns reported    No    Warm-up and Cool-down Performed on first and last piece of equipment    Resistance Training Performed Yes    VAD Patient? No    PAD/SET Patient? No      Pain Assessment   Currently in Pain? No/denies              Social History   Tobacco Use  Smoking Status Former Smoker  . Packs/day: 0.50  . Years: 1.00  . Pack years: 0.50  . Types: Cigarettes  . Quit date: 05/04/1965  . Years since quitting: 55.0  Smokeless Tobacco Never Used  Tobacco Comment   smoked for a few months in his early 20's.    Goals Met:  Independence with exercise equipment Exercise tolerated well No report of cardiac concerns or symptoms Strength training completed today  Goals Unmet:  Not Applicable  Comments: First full day of exercise!  Patient was oriented to gym and equipment including functions, settings, policies, and procedures.  Patient's individual exercise prescription and treatment plan were reviewed.  All starting workloads were established based on the results of the 6 minute walk test done at initial orientation visit.  The plan for exercise progression was also introduced and progression will be customized based on patient's performance and goals.    Dr. Emily Filbert is Medical Director for [place] and [facility].

## 2020-05-14 ENCOUNTER — Other Ambulatory Visit: Payer: Self-pay

## 2020-05-14 ENCOUNTER — Encounter: Payer: Medicare PPO | Admitting: *Deleted

## 2020-05-14 DIAGNOSIS — I214 Non-ST elevation (NSTEMI) myocardial infarction: Secondary | ICD-10-CM

## 2020-05-14 LAB — GLUCOSE, CAPILLARY
Glucose-Capillary: 106 mg/dL — ABNORMAL HIGH (ref 70–99)
Glucose-Capillary: 101 mg/dL — ABNORMAL HIGH (ref 70–99)

## 2020-05-14 NOTE — Progress Notes (Signed)
Daily Session Note  Patient Details  Name: Jake Castro MRN: [id number] Date of Birth: 02-02-1951 Referring Provider:   Flowsheet Row Cardiac Rehab from 05/11/2020 in [facility] Cardiac and Pulmonary Rehab  Referring Provider [name] MD      Encounter Date: 05/14/2020  Check In:  Session Check In - 05/14/20 0810      Check-In   Supervising physician immediately available to respond to emergencies See telemetry face sheet for immediately available ER MD    Location [facility]-Cardiac & Pulmonary Rehab    Staff Present Heath Lark, RN, BSN, CCRP;Jessica [place], MA, RCEP, CCRP, CCET;Joseph [place] RCP,RRT,BSRT    Virtual Visit No    Medication changes reported     No    Fall or balance concerns reported    No    Warm-up and Cool-down Performed on first and last piece of equipment    Resistance Training Performed Yes    VAD Patient? No    PAD/SET Patient? No      Pain Assessment   Currently in Pain? No/denies              Social History   Tobacco Use  Smoking Status Former Smoker  . Packs/day: 0.50  . Years: 1.00  . Pack years: 0.50  . Types: Cigarettes  . Quit date: 05/04/1965  . Years since quitting: 55.0  Smokeless Tobacco Never Used  Tobacco Comment   smoked for a few months in his early 20's.    Goals Met:  Independence with exercise equipment Exercise tolerated well No report of cardiac concerns or symptoms  Goals Unmet:  Not Applicable  Comments: Pt able to follow exercise prescription today without complaint.  Will continue to monitor for progression.    Dr. Emily Filbert is Medical Director for [place] and [facility].

## 2020-05-17 ENCOUNTER — Other Ambulatory Visit: Payer: Self-pay

## 2020-05-17 ENCOUNTER — Encounter: Payer: Medicare PPO | Admitting: *Deleted

## 2020-05-17 DIAGNOSIS — I214 Non-ST elevation (NSTEMI) myocardial infarction: Secondary | ICD-10-CM

## 2020-05-17 LAB — GLUCOSE, CAPILLARY
Glucose-Capillary: 91 mg/dL (ref 70–99)
Glucose-Capillary: 109 mg/dL — ABNORMAL HIGH (ref 70–99)

## 2020-05-17 NOTE — Progress Notes (Signed)
Daily Session Note  Patient Details  Name: NELL SCHRACK MRN: [id number] Date of Birth: August 25, 1950 Referring Provider:   Flowsheet Row Cardiac Rehab from 05/11/2020 in [facility] Cardiac and Pulmonary Rehab  Referring Provider [name] MD      Encounter Date: 05/17/2020  Check In:  Session Check In - 05/17/20 0810      Check-In   Supervising physician immediately available to respond to emergencies See telemetry face sheet for immediately available ER MD    Location [facility] & Pulmonary Rehab    Staff Present Heath Lark, RN, BSN, CCRP;Joseph Hood RCP,RRT,BSRT;Kelly [place], [place], ACSM CEP, Exercise Physiologist    Virtual Visit No    Medication changes reported     No    Fall or balance concerns reported    No    Warm-up and Cool-down Performed on first and last piece of equipment    Resistance Training Performed Yes    VAD Patient? No    PAD/SET Patient? No      Pain Assessment   Currently in Pain? No/denies              Social History   Tobacco Use  Smoking Status Former Smoker  . Packs/day: 0.50  . Years: 1.00  . Pack years: 0.50  . Types: Cigarettes  . Quit date: 05/04/1965  . Years since quitting: 55.0  Smokeless Tobacco Never Used  Tobacco Comment   smoked for a few months in his early 20's.    Goals Met:  Independence with exercise equipment Exercise tolerated well No report of cardiac concerns or symptoms  Goals Unmet:  Not Applicable  Comments: Pt able to follow exercise prescription today without complaint.  Will continue to monitor for progression.    Dr. Emily Filbert is Medical Director for [place] and [facility].

## 2020-05-19 ENCOUNTER — Encounter: Payer: Medicare PPO | Attending: Cardiovascular Disease

## 2020-05-19 ENCOUNTER — Other Ambulatory Visit: Payer: Self-pay

## 2020-05-19 DIAGNOSIS — Z5189 Encounter for other specified aftercare: Secondary | ICD-10-CM | POA: Diagnosis not present

## 2020-05-19 DIAGNOSIS — I252 Old myocardial infarction: Secondary | ICD-10-CM | POA: Diagnosis present

## 2020-05-19 DIAGNOSIS — I214 Non-ST elevation (NSTEMI) myocardial infarction: Secondary | ICD-10-CM

## 2020-05-19 NOTE — Progress Notes (Signed)
Daily Session Note  Patient Details  Name: LOTHAR PREHN MRN: [id number] Date of Birth: [birth year] Referring Provider:   Flowsheet Row Cardiac Rehab from 05/11/2020 in [facility] Cardiac and Pulmonary Rehab  Referring Provider [name] MD      Encounter Date: 05/19/2020  Check In:  Session Check In - 05/19/20 0801      Check-In   Supervising physician immediately available to respond to emergencies See telemetry face sheet for immediately available ER MD    Location [facility] Re[facility]    Staff Present Birdie Sons, MPA, RN;Joseph Darrin Nipper, [place], RCEP, CCRP, CCET    Virtual Visit No    Medication changes reported     No    Fall or balance concerns reported    No    Warm-up and Cool-down Performed on first and last piece of equipment    Resistance Training Performed Yes    VAD Patient? No    PAD/SET Patient? No      Pain Assessment   Currently in Pain? No/denies              Social History   Tobacco Use  Smoking Status Former Smoker  . Packs/day: 0.50  . Years: 1.00  . Pack years: 0.50  . Types: Cigarettes  . Quit date: 05/04/1965  . Years since quitting: 55.0  Smokeless Tobacco Never Used  Tobacco Comment   smoked for a few months in his early 20's.    Goals Met:  Independence with exercise equipment Exercise tolerated well No report of cardiac concerns or symptoms Strength training completed today  Goals Unmet:  Not Applicable  Comments: Pt able to follow exercise prescription today without complaint.  Will continue to monitor for progression.    Dr. Emily Filbert is Medical Director for [place] and [facility].

## 2020-05-21 ENCOUNTER — Other Ambulatory Visit: Payer: Self-pay

## 2020-05-21 DIAGNOSIS — Z5189 Encounter for other specified aftercare: Secondary | ICD-10-CM | POA: Diagnosis not present

## 2020-05-21 DIAGNOSIS — I214 Non-ST elevation (NSTEMI) myocardial infarction: Secondary | ICD-10-CM

## 2020-05-21 NOTE — Progress Notes (Signed)
Daily Session Note  Patient Details  Name: Jake Castro MRN: [id number] Date of Birth: January 02, 1951 Referring Provider:   Flowsheet Row Cardiac Rehab from 05/11/2020 in [facility] Cardiac and Pulmonary Rehab  Referring Provider [name] MD      Encounter Date: 05/21/2020  Check In:  Session Check In - 05/21/20 0816      Check-In   Supervising physician immediately available to respond to emergencies See telemetry face sheet for immediately available ER MD    Location [facility] & Pulmonary Rehab    Staff Present Birdie Sons, MPA, RN;Joseph Darrin Nipper, [place], RCEP, CCRP, CCET    Virtual Visit No    Medication changes reported     No    Fall or balance concerns reported    No    Warm-up and Cool-down Performed on first and last piece of equipment    Resistance Training Performed Yes    VAD Patient? No    PAD/SET Patient? No      Pain Assessment   Currently in Pain? No/denies              Social History   Tobacco Use  Smoking Status Former Smoker  . Packs/day: 0.50  . Years: 1.00  . Pack years: 0.50  . Types: Cigarettes  . Quit date: 05/04/1965  . Years since quitting: 55.0  Smokeless Tobacco Never Used  Tobacco Comment   smoked for a few months in his early 20's.    Goals Met:  Independence with exercise equipment Exercise tolerated well No report of cardiac concerns or symptoms Strength training completed today  Goals Unmet:  Not Applicable  Comments: Pt able to follow exercise prescription today without complaint.  Will continue to monitor for progression.    Dr. Emily Filbert is Medical Director for [place] and [facility].

## 2020-05-24 ENCOUNTER — Other Ambulatory Visit: Payer: Self-pay

## 2020-05-24 ENCOUNTER — Encounter: Payer: Medicare PPO | Admitting: *Deleted

## 2020-05-24 DIAGNOSIS — Z5189 Encounter for other specified aftercare: Secondary | ICD-10-CM | POA: Diagnosis not present

## 2020-05-24 DIAGNOSIS — I214 Non-ST elevation (NSTEMI) myocardial infarction: Secondary | ICD-10-CM

## 2020-05-24 NOTE — Progress Notes (Signed)
Daily Session Note  Patient Details  Name: Jake Castro MRN: [id number] Date of Birth: 06/01/1950 Referring Provider:   Flowsheet Row Cardiac Rehab from 05/11/2020 in [facility] Cardiac and Pulmonary Rehab  Referring Provider [name] MD      Encounter Date: 05/24/2020  Check In:  Session Check In - 05/24/20 0756      Check-In   Supervising physician immediately available to respond to emergencies See telemetry face sheet for immediately available ER MD    Location [facility] & Pulmonary Rehab    Staff Present Heath Lark, RN, BSN, CCRP;Joseph Hood RCP,RRT,BSRT;Kelly [place], [place], ACSM CEP, Exercise Physiologist    Virtual Visit No    Medication changes reported     No    Fall or balance concerns reported    No    Warm-up and Cool-down Performed on first and last piece of equipment    Resistance Training Performed Yes    VAD Patient? No    PAD/SET Patient? No      Pain Assessment   Currently in Pain? No/denies              Social History   Tobacco Use  Smoking Status Former Smoker  . Packs/day: 0.50  . Years: 1.00  . Pack years: 0.50  . Types: Cigarettes  . Quit date: 05/04/1965  . Years since quitting: 55.0  Smokeless Tobacco Never Used  Tobacco Comment   smoked for a few months in his early 20's.    Goals Met:  Independence with exercise equipment Exercise tolerated well Personal goals reviewed No report of cardiac concerns or symptoms  Goals Unmet:  Not Applicable  Comments: Pt able to follow exercise prescription today without complaint.  Will continue to monitor for progression.    Dr. Emily Filbert is Medical Director for [place] and [facility].

## 2020-06-09 ENCOUNTER — Other Ambulatory Visit: Payer: Self-pay

## 2020-06-09 ENCOUNTER — Encounter: Payer: Self-pay | Admitting: *Deleted

## 2020-06-09 DIAGNOSIS — I214 Non-ST elevation (NSTEMI) myocardial infarction: Secondary | ICD-10-CM

## 2020-06-09 DIAGNOSIS — Z5189 Encounter for other specified aftercare: Secondary | ICD-10-CM | POA: Diagnosis not present

## 2020-06-09 NOTE — Progress Notes (Signed)
Daily Session Note  Patient Details  Name: Jake Castro MRN: [id number] Date of Birth: 1950-11-23 Referring Provider:   Flowsheet Row Cardiac Rehab from 05/11/2020 in [facility] Cardiac and Pulmonary Rehab  Referring Provider [name] MD      Encounter Date: 06/09/2020  Check In:  Session Check In - 06/09/20 0752      Check-In   Supervising physician immediately available to respond to emergencies See telemetry face sheet for immediately available ER MD    Location [facility]-Cardiac & Pulmonary Rehab    Staff Present Birdie Sons, MPA, Elveria Rising, BA, ACSM CEP, Exercise Physiologist;Joseph Tessie Fass RCP,RRT,BSRT    Virtual Visit No    Medication changes reported     No    Fall or balance concerns reported    No    Warm-up and Cool-down Performed on first and last piece of equipment    Resistance Training Performed Yes    VAD Patient? No    PAD/SET Patient? No      Pain Assessment   Currently in Pain? No/denies              Social History   Tobacco Use  Smoking Status Former Smoker  . Packs/day: 0.50  . Years: 1.00  . Pack years: 0.50  . Types: Cigarettes  . Quit date: 05/04/1965  . Years since quitting: 55.1  Smokeless Tobacco Never Used  Tobacco Comment   smoked for a few months in his early 20's.    Goals Met:  Independence with exercise equipment Exercise tolerated well No report of cardiac concerns or symptoms Strength training completed today  Goals Unmet:  Not Applicable  Comments: Pt able to follow exercise prescription today without complaint.  Will continue to monitor for progression.    Dr. Emily Filbert is Medical Director for [place] and [facility].

## 2020-06-09 NOTE — Progress Notes (Signed)
Cardiac Individual Treatment Plan  Patient Details  Name: Jake Castro MRN: [id number] Date of Birth: 03-20-1951 Referring Provider:   Flowsheet Row Cardiac Rehab from 05/11/2020 in [facility] Cardiac and Pulmonary Rehab  Referring Provider [name] MD      Initial Encounter Date:  Flowsheet Row Cardiac Rehab from 05/11/2020 in [facility] Cardiac and Pulmonary Rehab  Date 05/11/20      Visit Diagnosis: NSTEMI (non-ST elevated myocardial infarction) [facility])  Patient's Home Medications on Admission:  Current Outpatient Medications:  .  acetaminophen (TYLENOL) 325 MG tablet, Take 2 tablets (650 mg total) by mouth every 6 (six) hours as needed for mild pain (or Fever >/= 101). (Patient not taking: Reported on 05/04/2020), Disp: , Rfl:  .  Ascorbic Acid (VITAMIN C) 1000 MG tablet, Take 1,000 mg by mouth daily. (Patient not taking: No sig reported), Disp: , Rfl:  .  aspirin 325 MG tablet, Take by mouth., Disp: , Rfl:  .  aspirin 81 MG tablet, Take 81 mg by mouth daily. Am (Patient not taking: No sig reported), Disp: , Rfl:  .  Blood Glucose Monitoring Suppl (ONETOUCH VERIO FLEX SYSTEM) w/Device KIT, U UTD TO TEST BLOOD SUGAR, Disp: , Rfl:  .  carvedilol (COREG) 12.5 MG tablet, Take 12.5 mg by mouth 2 (two) times daily with a meal., Disp: , Rfl:  .  chlorhexidine (PERIDEX) 0.12 % solution, chlorhexidine gluconate 0.12 % mouthwash  RINSE AND SPIT WITH 1/2 OZ FOR 30 SECONDS BID FOR 2 WEEKS, Disp: , Rfl:  .  Cholecalciferol (VITAMIN D-3) 5000 UNITS TABS, Take 5,000 mg by mouth daily., Disp: , Rfl:  .  Exenatide ER 2 MG SRER, Inject into the skin once a week. (Patient not taking: Reported on 05/04/2020), Disp: , Rfl:  .  fenofibrate (TRICOR) 145 MG tablet, Take 145 mg by mouth daily., Disp: , Rfl:  .  ferrous sulfate 325 (65 FE) MG tablet, Take by mouth. (Patient not taking: Reported on 05/04/2020), Disp: , Rfl:  .  glucose blood (ONETOUCH ULTRA) test strip, U UTD TO TEST BLOOD SUGAR (Patient not taking:  Reported on 05/04/2020), Disp: , Rfl:  .  glucose blood (PRECISION QID TEST) test strip, U UTD TO TEST BLOOD SUGAR (Patient not taking: Reported on 05/04/2020), Disp: , Rfl:  .  isosorbide mononitrate (IMDUR) 30 MG 24 hr tablet, Take 1 tablet (30 mg total) by mouth daily., Disp: 30 tablet, Rfl: 0 .  KRILL OIL PO, Take 1,000 mg by mouth daily.  (Patient not taking: No sig reported), Disp: , Rfl:  .  l-methylfolate-B6-B12 (METANX) 3-35-2 MG TABS tablet, Take 1 tablet by mouth daily. am (Patient not taking: No sig reported), Disp: , Rfl:  .  lisinopril (PRINIVIL,ZESTRIL) 40 MG tablet, Take 40 mg by mouth daily. am, Disp: , Rfl:  .  metFORMIN (GLUCOPHAGE) 1000 MG tablet, Take 1,000 mg by mouth 2 (two) times daily with a meal. Am and bedtime, Disp: , Rfl:  .  Methylcobalamin 1 MG CHEW, Chew by mouth. (Patient not taking: No sig reported), Disp: , Rfl:  .  Multiple Vitamin (MULTIVITAMIN) capsule, Take 1 capsule by mouth daily. (Patient not taking: No sig reported), Disp: , Rfl:  .  Omega-3 Fatty Acids (FISH OIL) 1000 MG CAPS, omega-3 acid ethyl esters 1 gram capsule, Disp: , Rfl:  .  rosuvastatin (CRESTOR) 40 MG tablet, Take 1 tablet (40 mg total) by mouth daily., Disp: 30 tablet, Rfl: 0 .  Semaglutide,0.25 or 0.5MG /DOS, (OZEMPIC, 0.25 OR 0.5  MG/DOSE,) 2 MG/1.5ML SOPN, Ozempic 0.25 mg or 0.5 mg (2 mg/1.5 mL) subcutaneous pen injector, Disp: , Rfl:  .  Specialty Vitamins Products (PROSTATE PO), Take by mouth daily. B6 $RemoveB'10mg'keSttkcj$  and zinc $Remove'30mg'oyKOWrw$  (Patient not taking: No sig reported), Disp: , Rfl:  .  ticagrelor (BRILINTA) 90 MG TABS tablet, Take 1 tablet (90 mg total) by mouth 2 (two) times daily., Disp: 60 tablet, Rfl: 0 .  triamcinolone cream (KENALOG) 0.5 %, triamcinolone acetonide 0.5 % topical cream, Disp: , Rfl:  .  Ubiquinol 200 MG CAPS, Take by mouth daily. (Patient not taking: No sig reported), Disp: , Rfl:  .  VITAMIN K PO, Take 1 tablet by mouth daily., Disp: , Rfl:  No current facility-administered  medications for this visit.  Facility-Administered Medications Ordered in Other Visits:  .  sodium chloride flush (NS) 0.9 % injection 3 mL, 3 mL, Intravenous, Q12H, Dionisio [name], MD  Past Medical History: Past Medical History:  Diagnosis Date  . CAD (coronary artery disease)    a. 12/2006 NSTEMI/PCI: RCA 65m/d->mid vessel stented, unable to cross dist dzs w/ balloon;  b. 11/2013 NL MV;  c. 11/2013 Echo: EF 55%;  d. 12/2013 Cath: LM 95 ost, LAD 70p, 72m, LCX 30p/m, OM1 60, RCA 20p, 57m/52m, 95d.  . Carotid arterial disease ([place])    a. s/p LCEA  . Diabetes mellitus ([place])    type 2  . HTN (hypertension)   . Hyperlipidemia   . Hypertension   . Morbid obesity ([place])   . Myocardial infarction ([place])   . Neuromuscular disorder ([facility])    neuropathy left foot  . Neuropathy    LEFT FOOT  . Plantar fasciitis   . Pneumonia   . Wears dentures    full upper and lower partial    Tobacco Use: Social History   Tobacco Use  Smoking Status Former Smoker  . Packs/day: 0.50  . Years: 1.00  . Pack years: 0.50  . Types: Cigarettes  . Quit date: 05/04/1965  . Years since quitting: 55.1  Smokeless Tobacco Never Used  Tobacco Comment   smoked for a few months in his early 20's.    Labs: Recent Review Flowsheet Data    Labs for ITP Cardiac and Pulmonary Rehab Latest Ref Rng & Units 01/03/2014 01/03/2014 01/04/2014 04/21/2020 04/22/2020   Cholestrol 0 - 200 mg/dL - - - - 121   LDLCALC 0 - 99 mg/dL - - - - 46   HDL >40 mg/dL - - - - 46   Trlycerides <150 mg/dL - - - - 146   Hemoglobin A1c 4.8 - 5.6 % - - - 6.7(H) -   PHART 7.350 - 7.450 7.344(L) - - - -   PCO2ART 35.0 - 45.0 mmHg 52.4(H) - - - -   HCO3 20.0 - 28.0 mmol/L 28.5(H) - - 29.2(H) -   TCO2 0 - 100 mmol/L $RemoveBe'30 30 26 'bthmcWLWl$ - -   ACIDBASEDEF 0.0 - 2.0 mmol/L - - - - -   O2SAT % 95.0 - - 60.8 -       Exercise Target Goals: Exercise Program Goal: Individual exercise prescription set using results from initial 6 min walk test and THRR while  considering  patient's activity barriers and safety.   Exercise Prescription Goal: Initial exercise prescription builds to 30-45 minutes a day of aerobic activity, 2-3 days per week.  Home exercise guidelines will be given to patient during program as part of exercise prescription that the participant will acknowledge.  Education: Aerobic Exercise: - Group verbal and visual presentation on the components of exercise prescription. Introduces F.I.T.T principle from ACSM for exercise prescriptions.  Reviews F.I.T.T. principles of aerobic exercise including progression. Written material given at graduation. Flowsheet Row Cardiac Rehab from 05/19/2020 in [facility] Cardiac and Pulmonary Rehab  Education need identified 05/11/20      Education: Resistance Exercise: - Group verbal and visual presentation on the components of exercise prescription. Introduces F.I.T.T principle from ACSM for exercise prescriptions  Reviews F.I.T.T. principles of resistance exercise including progression. Written material given at graduation. Flowsheet Row Cardiac Rehab from 05/19/2020 in [facility] Cardiac and Pulmonary Rehab  Date 05/12/20  Educator [facility]  Instruction Review Code 1- Verbalizes Understanding       Education: Exercise & Equipment Safety: - Individual verbal instruction and demonstration of equipment use and safety with use of the equipment.   Education: Exercise Physiology & General Exercise Guidelines: - Group verbal and written instruction with models to review the exercise physiology of the cardiovascular system and associated critical values. Provides general exercise guidelines with specific guidelines to those with heart or lung disease.    Education: Flexibility, Balance, Mind/Body Relaxation: - Group verbal and visual presentation with interactive activity on the components of exercise prescription. Introduces F.I.T.T principle from ACSM for exercise prescriptions. Reviews F.I.T.T. principles of  flexibility and balance exercise training including progression. Also discusses the mind body connection.  Reviews various relaxation techniques to help reduce and manage stress (i.e. Deep breathing, progressive muscle relaxation, and visualization). Balance handout provided to take home. Written material given at graduation. Flowsheet Row Cardiac Rehab from 05/19/2020 in [facility] Cardiac and Pulmonary Rehab  Date 05/19/20  Educator AS  Instruction Review Code 1- Verbalizes Understanding      Activity Barriers & Risk Stratification:  Activity Barriers & Cardiac Risk Stratification - 05/11/20 1158      Activity Barriers & Cardiac Risk Stratification   Activity Barriers Deconditioning;Muscular Weakness;Balance Concerns    Cardiac Risk Stratification High           6 Minute Walk:  6 Minute Walk    Row Name 05/11/20 1156         6 Minute Walk   Phase Initial     Distance 1435 feet     Walk Time 6 minutes     # of Rest Breaks 0     MPH 2.72     METS 3.22     RPE 15     Perceived Dyspnea  1     VO2 Peak 11.27     Symptoms Yes (comment)     Comments R leg calf pain and L thigh pain 4/10     Resting HR 76 bpm     Resting BP 132/70     Resting Oxygen Saturation  98 %     Exercise Oxygen Saturation  during 6 min walk 98 %     Max Ex. HR 106 bpm     Max Ex. BP 142/74     2 Minute Post BP 124/62            Oxygen Initial Assessment:   Oxygen Re-Evaluation:   Oxygen Discharge (Final Oxygen Re-Evaluation):   Initial Exercise Prescription:  Initial Exercise Prescription - 05/11/20 1100      Date of Initial Exercise RX and Referring Provider   Date 05/11/20    Referring Provider [name] MD      Treadmill   MPH 2.5    Grade 0.5  Minutes 15    METs 3.09      Recumbant Bike   Level 3    RPM 50    Watts 36    Minutes 15    METs 3      NuStep   Level 3    SPM 80    Minutes 15    METs 3      T5 Nustep   Level 3    SPM 80    Minutes 15    METs 3       Prescription Details   Frequency (times per week) 3    Duration Progress to 30 minutes of continuous aerobic without signs/symptoms of physical distress      Intensity   THRR 40-80% of Max Heartrate 105-134    Ratings of Perceived Exertion 11-13    Perceived Dyspnea 0-4      Progression   Progression Continue to progress workloads to maintain intensity without signs/symptoms of physical distress.      Resistance Training   Training Prescription Yes    Weight 4 lb    Reps 10-15           Perform Capillary Blood Glucose checks as needed.  Exercise Prescription Changes:  Exercise Prescription Changes    Row Name 05/11/20 1100 05/24/20 1500           Response to Exercise   Blood Pressure (Admit) 132/70 134/70      Blood Pressure (Exercise) 142/74 142/76      Blood Pressure (Exit) 124/60 124/62      Heart Rate (Admit) 76 bpm 63 bpm      Heart Rate (Exercise) 106 bpm 116 bpm      Heart Rate (Exit) 75 bpm 83 bpm      Oxygen Saturation (Admit) 98 % --      Oxygen Saturation (Exercise) 98 % --      Rating of Perceived Exertion (Exercise) 15 13      Perceived Dyspnea (Exercise) 1 --      Symptoms R calf pain, L thigh pain 4/10 none      Comments walk test results --      Duration -- Continue with 30 min of aerobic exercise without signs/symptoms of physical distress.      Intensity -- THRR unchanged             Progression   Progression -- Continue to progress workloads to maintain intensity without signs/symptoms of physical distress.      Average METs -- 3.81             Resistance Training   Training Prescription -- Yes      Weight -- 8 lb      Reps -- 10-15             Interval Training   Interval Training -- No             Treadmill   MPH -- 3      Grade -- 3      Minutes -- 15      METs -- 4.54             NuStep   Level -- 5      Minutes -- 15      METs -- 4.6             T5 Nustep   Level -- 5      Minutes -- 15  METs -- 3.1              Biostep-RELP   Level -- 3      Minutes -- 15      METs -- 3             Exercise Comments:  Exercise Comments    Row Name 05/12/20 0739           Exercise Comments First full day of exercise!  Patient was oriented to gym and equipment including functions, settings, policies, and procedures.  Patient's individual exercise prescription and treatment plan were reviewed.  All starting workloads were established based on the results of the 6 minute walk test done at initial orientation visit.  The plan for exercise progression was also introduced and progression will be customized based on patient's performance and goals.              Exercise Goals and Review:  Exercise Goals    Row Name 05/11/20 1454             Exercise Goals   Increase Physical Activity Yes       Intervention Provide advice, education, support and counseling about physical activity/exercise needs.;Develop an individualized exercise prescription for aerobic and resistive training based on initial evaluation findings, risk stratification, comorbidities and participant's personal goals.       Expected Outcomes Short Term: Attend rehab on a regular basis to increase amount of physical activity.;Long Term: Exercising regularly at least 3-5 days a week.;Long Term: Add in home exercise to make exercise part of routine and to increase amount of physical activity.       Increase Strength and Stamina Yes       Intervention Provide advice, education, support and counseling about physical activity/exercise needs.;Develop an individualized exercise prescription for aerobic and resistive training based on initial evaluation findings, risk stratification, comorbidities and participant's personal goals.       Expected Outcomes Short Term: Increase workloads from initial exercise prescription for resistance, speed, and METs.;Short Term: Perform resistance training exercises routinely during rehab and add in resistance  training at home;Long Term: Improve cardiorespiratory fitness, muscular endurance and strength as measured by increased METs and functional capacity (6MWT)       Able to understand and use rate of perceived exertion (RPE) scale Yes       Intervention Provide education and explanation on how to use RPE scale       Expected Outcomes Short Term: Able to use RPE daily in rehab to express subjective intensity level;Long Term:  Able to use RPE to guide intensity level when exercising independently       Able to understand and use Dyspnea scale Yes       Intervention Provide education and explanation on how to use Dyspnea scale       Expected Outcomes Short Term: Able to use Dyspnea scale daily in rehab to express subjective sense of shortness of breath during exertion;Long Term: Able to use Dyspnea scale to guide intensity level when exercising independently       Knowledge and understanding of Target Heart Rate Range (THRR) Yes       Intervention Provide education and explanation of THRR including how the numbers were predicted and where they are located for reference       Expected Outcomes Short Term: Able to state/look up THRR;Short Term: Able to use daily as guideline for intensity in rehab;Long Term: Able to use THRR to govern intensity when  exercising independently       Able to check pulse independently Yes       Intervention Provide education and demonstration on how to check pulse in carotid and radial arteries.;Review the importance of being able to check your own pulse for safety during independent exercise       Expected Outcomes Short Term: Able to explain why pulse checking is important during independent exercise;Long Term: Able to check pulse independently and accurately       Understanding of Exercise Prescription Yes       Intervention Provide education, explanation, and written materials on patient's individual exercise prescription       Expected Outcomes Short Term: Able to explain  program exercise prescription;Long Term: Able to explain home exercise prescription to exercise independently              Exercise Goals Re-Evaluation :  Exercise Goals Re-Evaluation    Row Name 05/12/20 0739 05/24/20 0744           Exercise Goal Re-Evaluation   Exercise Goals Review Increase Physical Activity;Able to understand and use rate of perceived exertion (RPE) scale;Knowledge and understanding of Target Heart Rate Range (THRR);Understanding of Exercise Prescription;Able to understand and use Dyspnea scale;Able to check pulse independently;Increase Strength and Stamina Increase Physical Activity;Able to understand and use rate of perceived exertion (RPE) scale;Knowledge and understanding of Target Heart Rate Range (THRR);Understanding of Exercise Prescription;Increase Strength and Stamina;Able to check pulse independently;Able to understand and use Dyspnea scale      Comments Reviewed RPE and dyspnea scales, THR and program prescription with pt today.  Pt voiced understanding and was given a copy of goals to take home. Patient has been gradually increasing workloads on machines in class to keep levels at a challenging intenstity. He works with varitiety of speed/elevation combinations on the TM. He stated that he feels he is getting stronger and doing will with his exercise. All vital signs during exercise have been in acceptable ranges.      Expected Outcomes Short: Use RPE daily to regulate intensity. Long: Follow program prescription in THR. Short: continue to attend cardiac rehab classes consistently and make gradual progression with exercise intensity. Long: become independent with execise progam.             Discharge Exercise Prescription (Final Exercise Prescription Changes):  Exercise Prescription Changes - 05/24/20 1500      Response to Exercise   Blood Pressure (Admit) 134/70    Blood Pressure (Exercise) 142/76    Blood Pressure (Exit) 124/62    Heart Rate (Admit) 63  bpm    Heart Rate (Exercise) 116 bpm    Heart Rate (Exit) 83 bpm    Rating of Perceived Exertion (Exercise) 13    Symptoms none    Duration Continue with 30 min of aerobic exercise without signs/symptoms of physical distress.    Intensity THRR unchanged      Progression   Progression Continue to progress workloads to maintain intensity without signs/symptoms of physical distress.    Average METs 3.81      Resistance Training   Training Prescription Yes    Weight 8 lb    Reps 10-15      Interval Training   Interval Training No      Treadmill   MPH 3    Grade 3    Minutes 15    METs 4.54      NuStep   Level 5    Minutes 15  METs 4.6      T5 Nustep   Level 5    Minutes 15    METs 3.1      Biostep-RELP   Level 3    Minutes 15    METs 3           Nutrition:  Target Goals: Understanding of nutrition guidelines, daily intake of sodium '1500mg'$ , cholesterol '200mg'$ , calories 30% from fat and 7% or less from saturated fats, daily to have 5 or more servings of fruits and vegetables.  Education: All About Nutrition: -Group instruction provided by verbal, written material, interactive activities, discussions, models, and posters to present general guidelines for heart healthy nutrition including fat, fiber, MyPlate, the role of sodium in heart healthy nutrition, utilization of the nutrition label, and utilization of this knowledge for meal planning. Follow up email sent as well. Written material given at graduation.   Biometrics:  Pre Biometrics - 05/11/20 1456      Pre Biometrics   Height 5' 11.1" (1.806 m)    Weight 205 lb 3.2 oz (93.1 kg)    BMI (Calculated) 28.54    Single Leg Stand 9.5 seconds            Nutrition Therapy Plan and Nutrition Goals:  Nutrition Therapy & Goals - 05/24/20 0834      Personal Nutrition Goals   Comments [name] would like to talk to RD regarding diet - going away for a couple of weeks. He is interested in intermittent fasting - 5pm  to 9am : discussed making sure to honor hunger and meet needs to reduce risk of muscle loss. partial meal recall --> B: 2 eggs with salsa L: [place] sandwich           Nutrition Assessments:  MEDIFICTS Score Key:  ?70 Need to make dietary changes   40-70 Heart Healthy Diet  ? 40 Therapeutic Level Cholesterol Diet  Flowsheet Row Cardiac Rehab from 05/11/2020 in [facility] Cardiac and Pulmonary Rehab  Picture Your Plate Total Score on Admission 53     Picture Your Plate Scores:  <95 Unhealthy dietary pattern with much room for improvement.  41-50 Dietary pattern unlikely to meet recommendations for good health and room for improvement.  51-60 More healthful dietary pattern, with some room for improvement.   >60 Healthy dietary pattern, although there may be some specific behaviors that could be improved.    Nutrition Goals Re-Evaluation:  Nutrition Goals Re-Evaluation    [place] Name 05/24/20 0749             Goals   Comment Has not yet met with program dietician. Appointment will be scheduled with program dietician in his upcoming visits.       Expected Outcome Short: meet with program dietician to set nutrician goals. Long: follow goals set by patient and program dieticia              Nutrition Goals Discharge (Final Nutrition Goals Re-Evaluation):  Nutrition Goals Re-Evaluation - 05/24/20 0749      Goals   Comment Has not yet met with program dietician. Appointment will be scheduled with program dietician in his upcoming visits.    Expected Outcome Short: meet with program dietician to set nutrician goals. Long: follow goals set by patient and program dieticia           Psychosocial: Target Goals: Acknowledge presence or absence of significant depression and/or stress, maximize coping skills, provide positive support system. Participant is able to verbalize types and  ability to use techniques and skills needed for reducing stress and depression.   Education: Stress,  Anxiety, and Depression - Group verbal and visual presentation to define topics covered.  Reviews how body is impacted by stress, anxiety, and depression.  Also discusses healthy ways to reduce stress and to treat/manage anxiety and depression.  Written material given at graduation.   Education: Sleep Hygiene -Provides group verbal and written instruction about how sleep can affect your health.  Define sleep hygiene, discuss sleep cycles and impact of sleep habits. Review good sleep hygiene tips.    Initial Review & Psychosocial Screening:  Initial Psych Review & Screening - 05/04/20 0849      Initial Review   Current issues with None Identified      Family Dynamics   Good Support System? Yes    Comments He can look to his wife, his friends [place] and his kids sometimes. His family cares for him but he would rather support them. Soham has a good outlook on his health.      Barriers   Psychosocial barriers to participate in program The patient should benefit from training in stress management and relaxation.;There are no identifiable barriers or psychosocial needs.      Screening Interventions   Interventions Encouraged to exercise;To provide support and resources with identified psychosocial needs;Provide feedback about the scores to participant    Expected Outcomes Short Term goal: Utilizing psychosocial counselor, staff and physician to assist with identification of specific Stressors or current issues interfering with healing process. Setting desired goal for each stressor or current issue identified.;Long Term Goal: Stressors or current issues are controlled or eliminated.;Short Term goal: Identification and review with participant of any Quality of Life or Depression concerns found by scoring the questionnaire.;Long Term goal: The participant improves quality of Life and PHQ9 Scores as seen by post scores and/or verbalization of changes           Quality of Life Scores:   Quality  of Life - 05/11/20 1456      Quality of Life   Select Quality of Life      Quality of Life Scores   Health/Function Pre 18.6 %    Socioeconomic Pre 23.44 %    Psych/Spiritual Pre 23.93 %    Family Pre 28.8 %    GLOBAL Pre 22.23 %          Scores of 19 and below usually indicate a poorer quality of life in these areas.  A difference of  2-3 points is a clinically meaningful difference.  A difference of 2-3 points in the total score of the Quality of Life Index has been associated with significant improvement in overall quality of life, self-image, physical symptoms, and general health in studies assessing change in quality of life.  PHQ-9: Recent Review Flowsheet Data    Depression screen [facility] 2/9 05/11/2020 09/10/2014   Decreased Interest 0 0   Down, Depressed, Hopeless 0 0   PHQ - 2 Score 0 0   Altered sleeping 3 0   Tired, decreased energy 1 0   Change in appetite 1 0   Feeling bad or failure about yourself  0 0   Trouble concentrating 0 0   Moving slowly or fidgety/restless 0 0   Suicidal thoughts 0 0   PHQ-9 Score 5 0   Difficult doing work/chores Somewhat difficult -     Interpretation of Total Score  Total Score Depression Severity:  1-4 = Minimal depression, 5-9 =  Mild depression, 10-14 = Moderate depression, 15-19 = Moderately severe depression, 20-27 = Severe depression   Psychosocial Evaluation and Intervention:  Psychosocial Evaluation - 05/04/20 0852      Psychosocial Evaluation & Interventions   Interventions Encouraged to exercise with the program and follow exercise prescription;Stress management education;Relaxation education    Comments He can look to his wife, his friends Sonia Side and his kids sometimes. His family cares for him but he would rather support them. Enis has a good outlook on his health.    Expected Outcomes Short: Exercise regularly to support mental health and notify staff of any changes. Long: maintain mental health and well being through  teaching of rehab or prescribed medications independently.    Continue Psychosocial Services  Follow up required by staff           Psychosocial Re-Evaluation:  Psychosocial Re-Evaluation    Donnelly Name 05/24/20 [phone]             Psychosocial Re-Evaluation   Comments Patient stated that he had no new stress concerns and is sleeping better currently then he was.       Expected Outcomes Short: continue to maintain healthy sleeping patterns. Long: Maintain good metal health with positive sleep and stress management habits.       Interventions Encouraged to attend Cardiac Rehabilitation for the exercise       Continue Psychosocial Services  Follow up required by staff              Psychosocial Discharge (Final Psychosocial Re-Evaluation):  Psychosocial Re-Evaluation - 05/24/20 0737      Psychosocial Re-Evaluation   Comments Patient stated that he had no new stress concerns and is sleeping better currently then he was.    Expected Outcomes Short: continue to maintain healthy sleeping patterns. Long: Maintain good metal health with positive sleep and stress management habits.    Interventions Encouraged to attend Cardiac Rehabilitation for the exercise    Continue Psychosocial Services  Follow up required by staff           Vocational Rehabilitation: Provide vocational rehab assistance to qualifying candidates.   Vocational Rehab Evaluation & Intervention:   Education: Education Goals: Education classes will be provided on a variety of topics geared toward better understanding of heart health and risk factor modification. Participant will state understanding/return demonstration of topics presented as noted by education test scores.  Learning Barriers/Preferences:  Learning Barriers/Preferences - 05/04/20 0848      Learning Barriers/Preferences   Learning Barriers None    Learning Preferences None           General Cardiac Education Topics:  AED/CPR: - Group verbal  and written instruction with the use of models to demonstrate the basic use of the AED with the basic ABC's of resuscitation.   Anatomy and Cardiac Procedures: - Group verbal and visual presentation and models provide information about basic cardiac anatomy and function. Reviews the testing methods done to diagnose heart disease and the outcomes of the test results. Describes the treatment choices: Medical Management, Angioplasty, or Coronary Bypass Surgery for treating various heart conditions including Myocardial Infarction, Angina, Valve Disease, and Cardiac Arrhythmias.  Written material given at graduation. Flowsheet Row Cardiac Rehab from 05/19/2020 in [facility] Cardiac and Pulmonary Rehab  Education need identified 05/11/20  Date 05/12/20  Educator SB  Instruction Review Code 1- Verbalizes Understanding      Medication Safety: - Group verbal and visual instruction to review commonly prescribed medications for heart  and lung disease. Reviews the medication, class of the drug, and side effects. Includes the steps to properly store meds and maintain the prescription regimen.  Written material given at graduation.   Intimacy: - Group verbal instruction through game format to discuss how heart and lung disease can affect sexual intimacy. Written material given at graduation.. Flowsheet Row Cardiac Rehab from 09/01/2014 in [facility] Cardiac and Pulmonary Rehab  Date 09/01/14  Educator MA  Instruction Review Code (retired) 2- meets goals/outcomes      Know Your Numbers and Heart Failure: - Group verbal and visual instruction to discuss disease risk factors for cardiac and pulmonary disease and treatment options.  Reviews associated critical values for Overweight/Obesity, Hypertension, Cholesterol, and Diabetes.  Discusses basics of heart failure: signs/symptoms and treatments.  Introduces Heart Failure Zone chart for action plan for heart failure.  Written material given at graduation.   Infection  Prevention: - Provides verbal and written material to individual with discussion of infection control including proper hand washing and proper equipment cleaning during exercise session. Flowsheet Row Cardiac Rehab from 05/19/2020 in [facility] Cardiac and Pulmonary Rehab  Date 05/04/20  Educator [facility]  Instruction Review Code 1- Verbalizes Understanding      Falls Prevention: - Provides verbal and written material to individual with discussion of falls prevention and safety. Flowsheet Row Cardiac Rehab from 05/19/2020 in [facility] Cardiac and Pulmonary Rehab  Date 05/04/20  Educator [facility]  Instruction Review Code 1- Verbalizes Understanding      Other: -Provides group and verbal instruction on various topics (see comments)   Knowledge Questionnaire Score:  Knowledge Questionnaire Score - 05/11/20 1458      Knowledge Questionnaire Score   Pre Score 24/26 Education Focus: PAD, Exercise           Core Components/Risk Factors/Patient Goals at Admission:  Personal Goals and Risk Factors at Admission - 05/11/20 1458      Core Components/Risk Factors/Patient Goals on Admission    Weight Management Yes;Weight Loss;Obesity    Intervention Weight Management: Develop a combined nutrition and exercise program designed to reach desired caloric intake, while maintaining appropriate intake of nutrient and fiber, sodium and fats, and appropriate energy expenditure required for the weight goal.;Weight Management: Provide education and appropriate resources to help participant work on and attain dietary goals.;Weight Management/Obesity: Establish reasonable short term and long term weight goals.;Obesity: Provide education and appropriate resources to help participant work on and attain dietary goals.    Admit Weight 205 lb 3.2 oz (93.1 kg)    Goal Weight: Short Term 200 lb (90.7 kg)    Goal Weight: Long Term 195 lb (88.5 kg)    Expected Outcomes Short Term: Continue to assess and modify interventions until short  term weight is achieved;Long Term: Adherence to nutrition and physical activity/exercise program aimed toward attainment of established weight goal;Weight Loss: Understanding of general recommendations for a balanced deficit meal plan, which promotes 1-2 lb weight loss per week and includes a negative energy balance of [phone] kcal/d;Understanding recommendations for meals to include 15-35% energy as protein, 25-35% energy from fat, 35-60% energy from carbohydrates, less than $RemoveB'200mg'IKrVQgVY$  of dietary cholesterol, 20-35 gm of total fiber daily;Understanding of distribution of calorie intake throughout the day with the consumption of 4-5 meals/snacks    Diabetes Yes    Intervention Provide education about signs/symptoms and action to take for hypo/hyperglycemia.;Provide education about proper nutrition, including hydration, and aerobic/resistive exercise prescription along with prescribed medications to achieve blood glucose in normal ranges: Fasting  glucose 65-99 mg/dL    Expected Outcomes Short Term: Participant verbalizes understanding of the signs/symptoms and immediate care of hyper/hypoglycemia, proper foot care and importance of medication, aerobic/resistive exercise and nutrition plan for blood glucose control.;Long Term: Attainment of HbA1C < 7%.    Hypertension Yes    Intervention Provide education on lifestyle modifcations including regular physical activity/exercise, weight management, moderate sodium restriction and increased consumption of fresh fruit, vegetables, and low fat dairy, alcohol moderation, and smoking cessation.;Monitor prescription use compliance.    Expected Outcomes Short Term: Continued assessment and intervention until BP is < 140/10mm HG in hypertensive participants. < 130/6mm HG in hypertensive participants with diabetes, heart failure or chronic kidney disease.;Long Term: Maintenance of blood pressure at goal levels.    Lipids Yes    Intervention Provide education and support for  participant on nutrition & aerobic/resistive exercise along with prescribed medications to achieve LDL '70mg'$ , HDL >$Remo'40mg'FKlbe$ .    Expected Outcomes Short Term: Participant states understanding of desired cholesterol values and is compliant with medications prescribed. Participant is following exercise prescription and nutrition guidelines.;Long Term: Cholesterol controlled with medications as prescribed, with individualized exercise RX and with personalized nutrition plan. Value goals: LDL < $Rem'70mg'pfvR$ , HDL > 40 mg.           Education:Diabetes - Individual verbal and written instruction to review signs/symptoms of diabetes, desired ranges of glucose level fasting, after meals and with exercise. Acknowledge that pre and post exercise glucose checks will be done for 3 sessions at entry of program. [place] from 05/19/2020 in [facility] Cardiac and Pulmonary Rehab  Date 05/04/20  Educator [facility]  Instruction Review Code 1- Verbalizes Understanding      Core Components/Risk Factors/Patient Goals Review:   Goals and Risk Factor Review    Row Name 05/24/20 0739             Core Components/Risk Factors/Patient Goals Review   Personal Goals Review Weight Management/Obesity;Hypertension;Lipids;Diabetes       Review Patient reports taking all medications as prescribed. He monitors blood sugars at home and reports numbers in acceptable ranges. His weight has increased slightly and he expressed interest in intermittent fasting. He was encouraged to be careful with his blood sugar levels and monitor them closely if he chooses to do this. He was not yet met with program dietician but will be scheduled for that in his upcoming visitis.       Expected Outcomes Short: continue to attend rehab consistently and take all medications. Long: control risk factors with heart healthy lifestyle choices.              Core Components/Risk Factors/Patient Goals at Discharge (Final Review):   Goals and Risk Factor  Review - 05/24/20 0739      Core Components/Risk Factors/Patient Goals Review   Personal Goals Review Weight Management/Obesity;Hypertension;Lipids;Diabetes    Review Patient reports taking all medications as prescribed. He monitors blood sugars at home and reports numbers in acceptable ranges. His weight has increased slightly and he expressed interest in intermittent fasting. He was encouraged to be careful with his blood sugar levels and monitor them closely if he chooses to do this. He was not yet met with program dietician but will be scheduled for that in his upcoming visitis.    Expected Outcomes Short: continue to attend rehab consistently and take all medications. Long: control risk factors with heart healthy lifestyle choices.           ITP Comments:  ITP  Comments    Row Name 05/04/20 [phone] 05/11/20 1154 05/12/20 0653 05/12/20 0739 06/09/20 0959   ITP Comments Virtual Visit completed. Patient informed on EP and RD appointment and 6 Minute walk test. Patient also informed of patient health questionnaires on My Chart. Patient Verbalizes understanding. Visit diagnosis can be found in [facility] 04/21/2020. Completed 6MWT and gym orientation. Initial ITP created and sent for review to Dr. Emily Filbert, Medical Director. 30 Day review completed. Medical Director ITP review done, changes made as directed, and signed approval by Medical Director.  New to program First full day of exercise!  Patient was oriented to gym and equipment including functions, settings, policies, and procedures.  Patient's individual exercise prescription and treatment plan were reviewed.  All starting workloads were established based on the results of the 6 minute walk test done at initial orientation visit.  The plan for exercise progression was also introduced and progression will be customized based on patient's performance and goals. 30 Day review completed. Medical Director ITP review done, changes made as directed, and signed  approval by Medical Director.          Comments:

## 2020-06-11 ENCOUNTER — Encounter: Payer: Medicare PPO | Admitting: *Deleted

## 2020-06-11 ENCOUNTER — Other Ambulatory Visit: Payer: Self-pay

## 2020-06-11 DIAGNOSIS — Z5189 Encounter for other specified aftercare: Secondary | ICD-10-CM | POA: Diagnosis not present

## 2020-06-11 DIAGNOSIS — I214 Non-ST elevation (NSTEMI) myocardial infarction: Secondary | ICD-10-CM

## 2020-06-11 NOTE — Progress Notes (Signed)
Daily Session Note  Patient Details  Name: Jake Castro MRN: [id number] Date of Birth: [birth year] Referring Provider:   Flowsheet Row Cardiac Rehab from 05/11/2020 in [facility] Cardiac and Pulmonary Rehab  Referring Provider [name] MD      Encounter Date: 06/11/2020  Check In:  Session Check In - 06/11/20 0842      Check-In   Supervising physician immediately available to respond to emergencies See telemetry face sheet for immediately available ER MD    Location [facility]-Cardiac & Pulmonary Rehab    Staff Present Heath Lark, RN, BSN, CCRP;Jessica [place], MA, RCEP, CCRP, CCET;Joseph [place] RCP,RRT,BSRT    Virtual Visit No    Medication changes reported     No    Fall or balance concerns reported    No    Warm-up and Cool-down Performed on first and last piece of equipment    Resistance Training Performed Yes    VAD Patient? No    PAD/SET Patient? No      Pain Assessment   Currently in Pain? No/denies              Social History   Tobacco Use  Smoking Status Former Smoker  . Packs/day: 0.50  . Years: 1.00  . Pack years: 0.50  . Types: Cigarettes  . Quit date: 05/04/1965  . Years since quitting: 55.1  Smokeless Tobacco Never Used  Tobacco Comment   smoked for a few months in his early 20's.    Goals Met:  Independence with exercise equipment Exercise tolerated well No report of cardiac concerns or symptoms  Goals Unmet:  Not Applicable  Comments: Pt able to follow exercise prescription today without complaint.  Will continue to monitor for progression.    Dr. Emily Filbert is Medical Director for [place] and [facility].

## 2020-06-14 ENCOUNTER — Encounter: Payer: Medicare PPO | Admitting: *Deleted

## 2020-06-14 ENCOUNTER — Other Ambulatory Visit: Payer: Self-pay

## 2020-06-14 DIAGNOSIS — Z5189 Encounter for other specified aftercare: Secondary | ICD-10-CM | POA: Diagnosis not present

## 2020-06-14 DIAGNOSIS — I214 Non-ST elevation (NSTEMI) myocardial infarction: Secondary | ICD-10-CM

## 2020-06-14 NOTE — Progress Notes (Signed)
Daily Session Note  Patient Details  Name: Jake Castro MRN: [id number] Date of Birth: 1950-04-18 Referring Provider:   Flowsheet Row Cardiac Rehab from 05/11/2020 in [facility] Cardiac and Pulmonary Rehab  Referring Provider [name] MD      Encounter Date: 06/14/2020  Check In:  Session Check In - 06/14/20 0843      Check-In   Supervising physician immediately available to respond to emergencies See telemetry face sheet for immediately available ER MD    Location [facility]-Cardiac & Pulmonary Rehab    Staff Present Justin Mend Jaci Carrel, BS, ACSM CEP, Exercise Physiologist;Jasma Seevers, RN, BSN, CCRP    Virtual Visit No    Medication changes reported     No    Fall or balance concerns reported    No    Warm-up and Cool-down Performed on first and last piece of equipment    Resistance Training Performed Yes    VAD Patient? No    PAD/SET Patient? No      Pain Assessment   Currently in Pain? No/denies              Social History   Tobacco Use  Smoking Status Former Smoker  . Packs/day: 0.50  . Years: 1.00  . Pack years: 0.50  . Types: Cigarettes  . Quit date: 05/04/1965  . Years since quitting: 55.1  Smokeless Tobacco Never Used  Tobacco Comment   smoked for a few months in his early 20's.    Goals Met:  Independence with exercise equipment Exercise tolerated well No report of cardiac concerns or symptoms  Goals Unmet:  Not Applicable  Comments: Pt able to follow exercise prescription today without complaint.  Will continue to monitor for progression.    Dr. Emily Filbert is Medical Director for [place] and [facility].

## 2020-06-16 ENCOUNTER — Other Ambulatory Visit: Payer: Self-pay

## 2020-06-16 DIAGNOSIS — Z5189 Encounter for other specified aftercare: Secondary | ICD-10-CM | POA: Diagnosis not present

## 2020-06-16 DIAGNOSIS — I214 Non-ST elevation (NSTEMI) myocardial infarction: Secondary | ICD-10-CM

## 2020-06-16 NOTE — Progress Notes (Signed)
Daily Session Note  Patient Details  Name: Jake Castro MRN: [id number] Date of Birth: 05-29-1950 Referring Provider:   Flowsheet Row Cardiac Rehab from 05/11/2020 in [facility] Cardiac and Pulmonary Rehab  Referring Provider [name] MD      Encounter Date: 06/16/2020  Check In:  Session Check In - 06/16/20 0805      Check-In   Supervising physician immediately available to respond to emergencies See telemetry face sheet for immediately available ER MD    Location [facility]-Cardiac & Pulmonary Rehab    Staff Present Birdie Sons, MPA, RN;Melissa Caiola RDN, Rowe Pavy, BA, ACSM CEP, Exercise Physiologist    Virtual Visit No    Medication changes reported     No    Fall or balance concerns reported    No    Warm-up and Cool-down Performed on first and last piece of equipment    Resistance Training Performed Yes    VAD Patient? No    PAD/SET Patient? No      Pain Assessment   Currently in Pain? No/denies              Social History   Tobacco Use  Smoking Status Former Smoker  . Packs/day: 0.50  . Years: 1.00  . Pack years: 0.50  . Types: Cigarettes  . Quit date: 05/04/1965  . Years since quitting: 55.1  Smokeless Tobacco Never Used  Tobacco Comment   smoked for a few months in his early 20's.    Goals Met:  Independence with exercise equipment Exercise tolerated well No report of cardiac concerns or symptoms Strength training completed today  Goals Unmet:  Not Applicable  Comments: Pt able to follow exercise prescription today without complaint.  Will continue to monitor for progression.    Dr. Emily Filbert is Medical Director for [place] and [facility].

## 2020-06-18 ENCOUNTER — Other Ambulatory Visit: Payer: Self-pay

## 2020-06-18 ENCOUNTER — Encounter: Payer: Medicare PPO | Attending: Cardiovascular Disease | Admitting: *Deleted

## 2020-06-18 DIAGNOSIS — I214 Non-ST elevation (NSTEMI) myocardial infarction: Secondary | ICD-10-CM | POA: Insufficient documentation

## 2020-06-18 NOTE — Progress Notes (Signed)
Daily Session Note  Patient Details  Name: Jake Castro MRN: [id number] Date of Birth: 22-Jul-1950 Referring Provider:   Flowsheet Row Cardiac Rehab from 05/11/2020 in [facility] Cardiac and Pulmonary Rehab  Referring Provider [name] MD      Encounter Date: 06/18/2020  Check In:  Session Check In - 06/18/20 0956      Check-In   Supervising physician immediately available to respond to emergencies See telemetry face sheet for immediately available ER MD    Location [facility]-Cardiac & Pulmonary Rehab    Staff Present Heath Lark, RN, BSN, CCRP;Jessica Manning, MA, RCEP, CCRP, CCET;Joseph Basile RCP,RRT,BSRT    Virtual Visit No    Medication changes reported     No    Fall or balance concerns reported    No    Warm-up and Cool-down Performed on first and last piece of equipment    Resistance Training Performed Yes    VAD Patient? No    PAD/SET Patient? No      Pain Assessment   Currently in Pain? No/denies              Social History   Tobacco Use  Smoking Status Former Smoker  . Packs/day: 0.50  . Years: 1.00  . Pack years: 0.50  . Types: Cigarettes  . Quit date: 05/04/1965  . Years since quitting: 55.1  Smokeless Tobacco Never Used  Tobacco Comment   smoked for a few months in his early 20's.    Goals Met:  Independence with exercise equipment Exercise tolerated well No report of cardiac concerns or symptoms  Goals Unmet:  Not Applicable  Comments: Pt able to follow exercise prescription today without complaint.  Will continue to monitor for progression.    Dr. Emily Filbert is Medical Director for [place] and [facility].

## 2020-06-21 ENCOUNTER — Other Ambulatory Visit: Payer: Self-pay

## 2020-06-21 ENCOUNTER — Encounter: Payer: Medicare PPO | Admitting: *Deleted

## 2020-06-21 DIAGNOSIS — I214 Non-ST elevation (NSTEMI) myocardial infarction: Secondary | ICD-10-CM

## 2020-06-21 NOTE — Progress Notes (Signed)
Daily Session Note  Patient Details  Name: Jake Castro MRN: [id number] Date of Birth: December 23, 1950 Referring Provider:   Flowsheet Row Cardiac Rehab from 05/11/2020 in [facility] Cardiac and Pulmonary Rehab  Referring Provider [name] MD      Encounter Date: 06/21/2020  Check In:  Session Check In - 06/21/20 0805      Check-In   Supervising physician immediately available to respond to emergencies See telemetry face sheet for immediately available ER MD    Location [facility]-Cardiac & Pulmonary Rehab    Staff Present Heath Lark, RN, BSN, CCRP;Joseph [place] RCP,RRT,BSRT;Kelly [place], [place], ACSM CEP, Exercise Physiologist    Virtual Visit No    Medication changes reported     No    Fall or balance concerns reported    No    Warm-up and Cool-down Performed on first and last piece of equipment    Resistance Training Performed Yes    VAD Patient? No    PAD/SET Patient? No      Pain Assessment   Currently in Pain? No/denies              Social History   Tobacco Use  Smoking Status Former Smoker  . Packs/day: 0.50  . Years: 1.00  . Pack years: 0.50  . Types: Cigarettes  . Quit date: 05/04/1965  . Years since quitting: 55.1  Smokeless Tobacco Never Used  Tobacco Comment   smoked for a few months in his early 20's.    Goals Met:  Independence with exercise equipment Exercise tolerated well No report of cardiac concerns or symptoms  Goals Unmet:  Not Applicable  Comments: Pt able to follow exercise prescription today without complaint.  Will continue to monitor for progression.    Dr. Emily Filbert is Medical Director for [place] and [facility].

## 2020-06-25 LAB — EXTERNAL GENERIC LAB PROCEDURE: COLOGUARD: NEGATIVE

## 2020-06-25 LAB — COLOGUARD: COLOGUARD: NEGATIVE

## 2020-06-28 ENCOUNTER — Encounter: Payer: Medicare PPO | Admitting: *Deleted

## 2020-06-30 ENCOUNTER — Encounter: Payer: Self-pay | Admitting: *Deleted

## 2020-06-30 DIAGNOSIS — I214 Non-ST elevation (NSTEMI) myocardial infarction: Secondary | ICD-10-CM

## 2020-07-07 ENCOUNTER — Encounter: Payer: Self-pay | Admitting: *Deleted

## 2020-07-07 DIAGNOSIS — I214 Non-ST elevation (NSTEMI) myocardial infarction: Secondary | ICD-10-CM

## 2020-07-07 NOTE — Progress Notes (Signed)
Cardiac Individual Treatment Plan  Patient Details  Name: Jake Castro MRN: [id number] Date of Birth: 09/29/1950 Referring Provider:   Flowsheet Row Cardiac Rehab from 05/11/2020 in [facility] Cardiac and Pulmonary Rehab  Referring Provider [name] MD      Initial Encounter Date:  Flowsheet Row Cardiac Rehab from 05/11/2020 in [facility] Cardiac and Pulmonary Rehab  Date 05/11/20      Visit Diagnosis: NSTEMI (non-ST elevated myocardial infarction) [facility])  Patient's Home Medications on Admission:  Current Outpatient Medications:  .  acetaminophen (TYLENOL) 325 MG tablet, Take 2 tablets (650 mg total) by mouth every 6 (six) hours as needed for mild pain (or Fever >/= 101). (Patient not taking: Reported on 05/04/2020), Disp: , Rfl:  .  Ascorbic Acid (VITAMIN C) 1000 MG tablet, Take 1,000 mg by mouth daily. (Patient not taking: No sig reported), Disp: , Rfl:  .  aspirin 325 MG tablet, Take by mouth., Disp: , Rfl:  .  aspirin 81 MG tablet, Take 81 mg by mouth daily. Am (Patient not taking: No sig reported), Disp: , Rfl:  .  Blood Glucose Monitoring Suppl (ONETOUCH VERIO FLEX SYSTEM) w/Device KIT, U UTD TO TEST BLOOD SUGAR, Disp: , Rfl:  .  carvedilol (COREG) 12.5 MG tablet, Take 12.5 mg by mouth 2 (two) times daily with a meal., Disp: , Rfl:  .  chlorhexidine (PERIDEX) 0.12 % solution, chlorhexidine gluconate 0.12 % mouthwash  RINSE AND SPIT WITH 1/2 OZ FOR 30 SECONDS BID FOR 2 WEEKS, Disp: , Rfl:  .  Cholecalciferol (VITAMIN D-3) 5000 UNITS TABS, Take 5,000 mg by mouth daily., Disp: , Rfl:  .  Exenatide ER 2 MG SRER, Inject into the skin once a week. (Patient not taking: Reported on 05/04/2020), Disp: , Rfl:  .  fenofibrate (TRICOR) 145 MG tablet, Take 145 mg by mouth daily., Disp: , Rfl:  .  ferrous sulfate 325 (65 FE) MG tablet, Take by mouth. (Patient not taking: Reported on 05/04/2020), Disp: , Rfl:  .  glucose blood (ONETOUCH ULTRA) test strip, U UTD TO TEST BLOOD SUGAR (Patient not taking:  Reported on 05/04/2020), Disp: , Rfl:  .  glucose blood (PRECISION QID TEST) test strip, U UTD TO TEST BLOOD SUGAR (Patient not taking: Reported on 05/04/2020), Disp: , Rfl:  .  isosorbide mononitrate (IMDUR) 30 MG 24 hr tablet, Take 1 tablet (30 mg total) by mouth daily., Disp: 30 tablet, Rfl: 0 .  KRILL OIL PO, Take 1,000 mg by mouth daily.  (Patient not taking: No sig reported), Disp: , Rfl:  .  l-methylfolate-B6-B12 (METANX) 3-35-2 MG TABS tablet, Take 1 tablet by mouth daily. am (Patient not taking: No sig reported), Disp: , Rfl:  .  lisinopril (PRINIVIL,ZESTRIL) 40 MG tablet, Take 40 mg by mouth daily. am, Disp: , Rfl:  .  metFORMIN (GLUCOPHAGE) 1000 MG tablet, Take 1,000 mg by mouth 2 (two) times daily with a meal. Am and bedtime, Disp: , Rfl:  .  Methylcobalamin 1 MG CHEW, Chew by mouth. (Patient not taking: No sig reported), Disp: , Rfl:  .  Multiple Vitamin (MULTIVITAMIN) capsule, Take 1 capsule by mouth daily. (Patient not taking: No sig reported), Disp: , Rfl:  .  Omega-3 Fatty Acids (FISH OIL) 1000 MG CAPS, omega-3 acid ethyl esters 1 gram capsule, Disp: , Rfl:  .  rosuvastatin (CRESTOR) 40 MG tablet, Take 1 tablet (40 mg total) by mouth daily., Disp: 30 tablet, Rfl: 0 .  Semaglutide,0.25 or 0.5MG /DOS, (OZEMPIC, 0.25 OR 0.5  MG/DOSE,) 2 MG/1.5ML SOPN, Ozempic 0.25 mg or 0.5 mg (2 mg/1.5 mL) subcutaneous pen injector, Disp: , Rfl:  .  Specialty Vitamins Products (PROSTATE PO), Take by mouth daily. B6 $RemoveB'10mg'WXShWIEB$  and zinc $Remove'30mg'FUplMSk$  (Patient not taking: No sig reported), Disp: , Rfl:  .  ticagrelor (BRILINTA) 90 MG TABS tablet, Take 1 tablet (90 mg total) by mouth 2 (two) times daily., Disp: 60 tablet, Rfl: 0 .  triamcinolone cream (KENALOG) 0.5 %, triamcinolone acetonide 0.5 % topical cream, Disp: , Rfl:  .  Ubiquinol 200 MG CAPS, Take by mouth daily. (Patient not taking: No sig reported), Disp: , Rfl:  .  VITAMIN K PO, Take 1 tablet by mouth daily., Disp: , Rfl:  No current facility-administered  medications for this visit.  Facility-Administered Medications Ordered in Other Visits:  .  sodium chloride flush (NS) 0.9 % injection 3 mL, 3 mL, Intravenous, Q12H, Dionisio [name], MD  Past Medical History: Past Medical History:  Diagnosis Date  . CAD (coronary artery disease)    a. 12/2006 NSTEMI/PCI: RCA 33m/d->mid vessel stented, unable to cross dist dzs w/ balloon;  b. 11/2013 NL MV;  c. 11/2013 Echo: EF 55%;  d. 12/2013 Cath: LM 95 ost, LAD 70p, 26m, LCX 30p/m, OM1 60, RCA 20p, 24m/2m, 95d.  . Carotid arterial disease ([place])    a. s/p LCEA  . Diabetes mellitus ([place])    type 2  . HTN (hypertension)   . Hyperlipidemia   . Hypertension   . Morbid obesity ([place])   . Myocardial infarction ([place])   . Neuromuscular disorder ([facility])    neuropathy left foot  . Neuropathy    LEFT FOOT  . Plantar fasciitis   . Pneumonia   . Wears dentures    full upper and lower partial    Tobacco Use: Social History   Tobacco Use  Smoking Status Former Smoker  . Packs/day: 0.50  . Years: 1.00  . Pack years: 0.50  . Types: Cigarettes  . Quit date: 05/04/1965  . Years since quitting: 55.2  Smokeless Tobacco Never Used  Tobacco Comment   smoked for a few months in his early 20's.    Labs: Recent Review Flowsheet Data    Labs for ITP Cardiac and Pulmonary Rehab Latest Ref Rng & Units 01/03/2014 01/03/2014 01/04/2014 04/21/2020 04/22/2020   Cholestrol 0 - 200 mg/dL - - - - 121   LDLCALC 0 - 99 mg/dL - - - - 46   HDL >40 mg/dL - - - - 46   Trlycerides <150 mg/dL - - - - 146   Hemoglobin A1c 4.8 - 5.6 % - - - 6.7(H) -   PHART 7.350 - 7.450 7.344(L) - - - -   PCO2ART 35.0 - 45.0 mmHg 52.4(H) - - - -   HCO3 20.0 - 28.0 mmol/L 28.5(H) - - 29.2(H) -   TCO2 0 - 100 mmol/L $RemoveBe'30 30 26 'TESdJoOLG$ - -   ACIDBASEDEF 0.0 - 2.0 mmol/L - - - - -   O2SAT % 95.0 - - 60.8 -       Exercise Target Goals: Exercise Program Goal: Individual exercise prescription set using results from initial 6 min walk test and THRR while  considering  patient's activity barriers and safety.   Exercise Prescription Goal: Initial exercise prescription builds to 30-45 minutes a day of aerobic activity, 2-3 days per week.  Home exercise guidelines will be given to patient during program as part of exercise prescription that the participant will acknowledge.  Education: Aerobic Exercise: - Group verbal and visual presentation on the components of exercise prescription. Introduces F.I.T.T principle from ACSM for exercise prescriptions.  Reviews F.I.T.T. principles of aerobic exercise including progression. Written material given at graduation. Flowsheet Row Cardiac Rehab from 06/16/2020 in [facility] Cardiac and Pulmonary Rehab  Education need identified 05/11/20      Education: Resistance Exercise: - Group verbal and visual presentation on the components of exercise prescription. Introduces F.I.T.T principle from ACSM for exercise prescriptions  Reviews F.I.T.T. principles of resistance exercise including progression. Written material given at graduation. Flowsheet Row Cardiac Rehab from 06/16/2020 in [facility] Cardiac and Pulmonary Rehab  Date 05/12/20  Educator [facility]  Instruction Review Code 1- Verbalizes Understanding       Education: Exercise & Equipment Safety: - Individual verbal instruction and demonstration of equipment use and safety with use of the equipment.   Education: Exercise Physiology & General Exercise Guidelines: - Group verbal and written instruction with models to review the exercise physiology of the cardiovascular system and associated critical values. Provides general exercise guidelines with specific guidelines to those with heart or lung disease.    Education: Flexibility, Balance, Mind/Body Relaxation: - Group verbal and visual presentation with interactive activity on the components of exercise prescription. Introduces F.I.T.T principle from ACSM for exercise prescriptions. Reviews F.I.T.T. principles of  flexibility and balance exercise training including progression. Also discusses the mind body connection.  Reviews various relaxation techniques to help reduce and manage stress (i.e. Deep breathing, progressive muscle relaxation, and visualization). Balance handout provided to take home. Written material given at graduation. Flowsheet Row Cardiac Rehab from 06/16/2020 in [facility] Cardiac and Pulmonary Rehab  Date 05/19/20  Educator AS  Instruction Review Code 1- Verbalizes Understanding      Activity Barriers & Risk Stratification:  Activity Barriers & Cardiac Risk Stratification - 05/11/20 1158      Activity Barriers & Cardiac Risk Stratification   Activity Barriers Deconditioning;Muscular Weakness;Balance Concerns    Cardiac Risk Stratification High           6 Minute Walk:  6 Minute Walk    Row Name 05/11/20 1156         6 Minute Walk   Phase Initial     Distance 1435 feet     Walk Time 6 minutes     # of Rest Breaks 0     MPH 2.72     METS 3.22     RPE 15     Perceived Dyspnea  1     VO2 Peak 11.27     Symptoms Yes (comment)     Comments R leg calf pain and L thigh pain 4/10     Resting HR 76 bpm     Resting BP 132/70     Resting Oxygen Saturation  98 %     Exercise Oxygen Saturation  during 6 min walk 98 %     Max Ex. HR 106 bpm     Max Ex. BP 142/74     2 Minute Post BP 124/62            Oxygen Initial Assessment:   Oxygen Re-Evaluation:   Oxygen Discharge (Final Oxygen Re-Evaluation):   Initial Exercise Prescription:  Initial Exercise Prescription - 05/11/20 1100      Date of Initial Exercise RX and Referring Provider   Date 05/11/20    Referring Provider [name] MD      Treadmill   MPH 2.5    Grade 0.5  Minutes 15    METs 3.09      Recumbant Bike   Level 3    RPM 50    Watts 36    Minutes 15    METs 3      NuStep   Level 3    SPM 80    Minutes 15    METs 3      T5 Nustep   Level 3    SPM 80    Minutes 15    METs 3       Prescription Details   Frequency (times per week) 3    Duration Progress to 30 minutes of continuous aerobic without signs/symptoms of physical distress      Intensity   THRR 40-80% of Max Heartrate 105-134    Ratings of Perceived Exertion 11-13    Perceived Dyspnea 0-4      Progression   Progression Continue to progress workloads to maintain intensity without signs/symptoms of physical distress.      Resistance Training   Training Prescription Yes    Weight 4 lb    Reps 10-15           Perform Capillary Blood Glucose checks as needed.  Exercise Prescription Changes:  Exercise Prescription Changes    Row Name 05/11/20 1100 05/24/20 1500 06/09/20 1400 06/22/20 1500       Response to Exercise   Blood Pressure (Admit) 132/70 134/70 118/60 126/70    Blood Pressure (Exercise) 142/74 142/76 148/60 174/68    Blood Pressure (Exit) 124/60 124/62 96/52 112/62    Heart Rate (Admit) 76 bpm 63 bpm 78 bpm 73 bpm    Heart Rate (Exercise) 106 bpm 116 bpm 129 bpm 133 bpm    Heart Rate (Exit) 75 bpm 83 bpm 83 bpm 108 bpm    Oxygen Saturation (Admit) 98 % -- -- --    Oxygen Saturation (Exercise) 98 % -- -- --    Rating of Perceived Exertion (Exercise) 15 13 14 14     Perceived Dyspnea (Exercise) 1 -- -- --    Symptoms R calf pain, L thigh pain 4/10 none none none    Comments walk test results -- -- --    Duration -- Continue with 30 min of aerobic exercise without signs/symptoms of physical distress. Continue with 30 min of aerobic exercise without signs/symptoms of physical distress. Continue with 30 min of aerobic exercise without signs/symptoms of physical distress.    Intensity -- THRR unchanged THRR unchanged THRR unchanged         Progression   Progression -- Continue to progress workloads to maintain intensity without signs/symptoms of physical distress. Continue to progress workloads to maintain intensity without signs/symptoms of physical distress. Continue to progress workloads  to maintain intensity without signs/symptoms of physical distress.    Average METs -- 3.81 4.2 4.87         Resistance Training   Training Prescription -- Yes Yes Yes    Weight -- 8 lb 8 lb 12 lb    Reps -- 10-15 10-15 10-15         Interval Training   Interval Training -- No No No         Treadmill   MPH -- 3 3 3.5    Grade -- 3 2 3     Minutes -- 15 15 15     METs -- 4.54 4.12 5.13         Recumbant Bike   Level -- -- --  4    Minutes -- -- -- 15         NuStep   Level -- 5 -- 8    Minutes -- 15 -- 15    METs -- 4.6 -- 4.5         T5 Nustep   Level -- 5 6 --    Minutes -- 15 15 --    METs -- 3.1 4.3 --         Biostep-RELP   Level -- 3 -- 7    Minutes -- 15 -- 15    METs -- 3 -- 5           Exercise Comments:  Exercise Comments    Row Name 05/12/20 0739           Exercise Comments First full day of exercise!  Patient was oriented to gym and equipment including functions, settings, policies, and procedures.  Patient's individual exercise prescription and treatment plan were reviewed.  All starting workloads were established based on the results of the 6 minute walk test done at initial orientation visit.  The plan for exercise progression was also introduced and progression will be customized based on patient's performance and goals.              Exercise Goals and Review:  Exercise Goals    Row Name 05/11/20 1454             Exercise Goals   Increase Physical Activity Yes       Intervention Provide advice, education, support and counseling about physical activity/exercise needs.;Develop an individualized exercise prescription for aerobic and resistive training based on initial evaluation findings, risk stratification, comorbidities and participant's personal goals.       Expected Outcomes Short Term: Attend rehab on a regular basis to increase amount of physical activity.;Long Term: Exercising regularly at least 3-5 days a week.;Long Term: Add in  home exercise to make exercise part of routine and to increase amount of physical activity.       Increase Strength and Stamina Yes       Intervention Provide advice, education, support and counseling about physical activity/exercise needs.;Develop an individualized exercise prescription for aerobic and resistive training based on initial evaluation findings, risk stratification, comorbidities and participant's personal goals.       Expected Outcomes Short Term: Increase workloads from initial exercise prescription for resistance, speed, and METs.;Short Term: Perform resistance training exercises routinely during rehab and add in resistance training at home;Long Term: Improve cardiorespiratory fitness, muscular endurance and strength as measured by increased METs and functional capacity (6MWT)       Able to understand and use rate of perceived exertion (RPE) scale Yes       Intervention Provide education and explanation on how to use RPE scale       Expected Outcomes Short Term: Able to use RPE daily in rehab to express subjective intensity level;Long Term:  Able to use RPE to guide intensity level when exercising independently       Able to understand and use Dyspnea scale Yes       Intervention Provide education and explanation on how to use Dyspnea scale       Expected Outcomes Short Term: Able to use Dyspnea scale daily in rehab to express subjective sense of shortness of breath during exertion;Long Term: Able to use Dyspnea scale to guide intensity level when exercising independently       Knowledge and understanding of Target  Heart Rate Range (THRR) Yes       Intervention Provide education and explanation of THRR including how the numbers were predicted and where they are located for reference       Expected Outcomes Short Term: Able to state/look up THRR;Short Term: Able to use daily as guideline for intensity in rehab;Long Term: Able to use THRR to govern intensity when exercising independently        Able to check pulse independently Yes       Intervention Provide education and demonstration on how to check pulse in carotid and radial arteries.;Review the importance of being able to check your own pulse for safety during independent exercise       Expected Outcomes Short Term: Able to explain why pulse checking is important during independent exercise;Long Term: Able to check pulse independently and accurately       Understanding of Exercise Prescription Yes       Intervention Provide education, explanation, and written materials on patient's individual exercise prescription       Expected Outcomes Short Term: Able to explain program exercise prescription;Long Term: Able to explain home exercise prescription to exercise independently              Exercise Goals Re-Evaluation :  Exercise Goals Re-Evaluation    Row Name 05/12/20 0739 05/24/20 0744 06/09/20 1407 06/22/20 1527 06/30/20 0747     Exercise Goal Re-Evaluation   Exercise Goals Review Increase Physical Activity;Able to understand and use rate of perceived exertion (RPE) scale;Knowledge and understanding of Target Heart Rate Range (THRR);Understanding of Exercise Prescription;Able to understand and use Dyspnea scale;Able to check pulse independently;Increase Strength and Stamina Increase Physical Activity;Able to understand and use rate of perceived exertion (RPE) scale;Knowledge and understanding of Target Heart Rate Range (THRR);Understanding of Exercise Prescription;Increase Strength and Stamina;Able to check pulse independently;Able to understand and use Dyspnea scale -- Increase Physical Activity;Increase Strength and Stamina;Understanding of Exercise Prescription --   Comments Reviewed RPE and dyspnea scales, THR and program prescription with pt today.  Pt voiced understanding and was given a copy of goals to take home. Patient has been gradually increasing workloads on machines in class to keep levels at a challenging  intenstity. He works with varitiety of speed/elevation combinations on the TM. He stated that he feels he is getting stronger and doing will with his exercise. All vital signs during exercise have been in acceptable ranges. Nadine Counts has just returned after vacation where he did a lot of walking. He did increase level on T5 today.  His average MET level was 4.2.  Staff will monitor progress. Nadine Counts is doing well in rehab.  He is now up to 3.5 mph on the treadmill at 3%.  He really enjoys pushing himself on the treadmill.  We will continue to monitor his progress. Out since last review   Expected Outcomes Short: Use RPE daily to regulate intensity. Long: Follow program prescription in THR. Short: continue to attend cardiac rehab classes consistently and make gradual progression with exercise intensity. Long: become independent with execise progam. Short:  get back to regular attendance Long:  improve overall stamina Short: Continue increase workloads Long; Continue to improve stamina --          Discharge Exercise Prescription (Final Exercise Prescription Changes):  Exercise Prescription Changes - 06/22/20 1500      Response to Exercise   Blood Pressure (Admit) 126/70    Blood Pressure (Exercise) 174/68    Blood Pressure (Exit) 112/62  Heart Rate (Admit) 73 bpm    Heart Rate (Exercise) 133 bpm    Heart Rate (Exit) 108 bpm    Rating of Perceived Exertion (Exercise) 14    Symptoms none    Duration Continue with 30 min of aerobic exercise without signs/symptoms of physical distress.    Intensity THRR unchanged      Progression   Progression Continue to progress workloads to maintain intensity without signs/symptoms of physical distress.    Average METs 4.87      Resistance Training   Training Prescription Yes    Weight 12 lb    Reps 10-15      Interval Training   Interval Training No      Treadmill   MPH 3.5    Grade 3    Minutes 15    METs 5.13      Recumbant Bike   Level 4    Minutes  15      NuStep   Level 8    Minutes 15    METs 4.5      Biostep-RELP   Level 7    Minutes 15    METs 5           Nutrition:  Target Goals: Understanding of nutrition guidelines, daily intake of sodium '1500mg'$ , cholesterol '200mg'$ , calories 30% from fat and 7% or less from saturated fats, daily to have 5 or more servings of fruits and vegetables.  Education: All About Nutrition: -Group instruction provided by verbal, written material, interactive activities, discussions, models, and posters to present general guidelines for heart healthy nutrition including fat, fiber, MyPlate, the role of sodium in heart healthy nutrition, utilization of the nutrition label, and utilization of this knowledge for meal planning. Follow up email sent as well. Written material given at graduation.   Biometrics:  Pre Biometrics - 05/11/20 1456      Pre Biometrics   Height 5' 11.1" (1.806 m)    Weight 205 lb 3.2 oz (93.1 kg)    BMI (Calculated) 28.54    Single Leg Stand 9.5 seconds            Nutrition Therapy Plan and Nutrition Goals:  Nutrition Therapy & Goals - 05/24/20 0834      Personal Nutrition Goals   Comments [name] would like to talk to RD regarding diet - going away for a couple of weeks. He is interested in intermittent fasting - 5pm to 9am : discussed making sure to honor hunger and meet needs to reduce risk of muscle loss. partial meal recall --> B: 2 eggs with salsa L: [place] sandwich           Nutrition Assessments:  MEDIFICTS Score Key:  ?70 Need to make dietary changes   40-70 Heart Healthy Diet  ? 40 Therapeutic Level Cholesterol Diet  Flowsheet Row Cardiac Rehab from 05/11/2020 in [facility] Cardiac and Pulmonary Rehab  Picture Your Plate Total Score on Admission 53     Picture Your Plate Scores:  <25 Unhealthy dietary pattern with much room for improvement.  41-50 Dietary pattern unlikely to meet recommendations for good health and room for improvement.  51-60  More healthful dietary pattern, with some room for improvement.   >60 Healthy dietary pattern, although there may be some specific behaviors that could be improved.    Nutrition Goals Re-Evaluation:  Nutrition Goals Re-Evaluation    [place] Name 05/24/20 0749             Goals  Comment Has not yet met with program dietician. Appointment will be scheduled with program dietician in his upcoming visits.       Expected Outcome Short: meet with program dietician to set nutrician goals. Long: follow goals set by patient and program dieticia              Nutrition Goals Discharge (Final Nutrition Goals Re-Evaluation):  Nutrition Goals Re-Evaluation - 05/24/20 0749      Goals   Comment Has not yet met with program dietician. Appointment will be scheduled with program dietician in his upcoming visits.    Expected Outcome Short: meet with program dietician to set nutrician goals. Long: follow goals set by patient and program dieticia           Psychosocial: Target Goals: Acknowledge presence or absence of significant depression and/or stress, maximize coping skills, provide positive support system. Participant is able to verbalize types and ability to use techniques and skills needed for reducing stress and depression.   Education: Stress, Anxiety, and Depression - Group verbal and visual presentation to define topics covered.  Reviews how body is impacted by stress, anxiety, and depression.  Also discusses healthy ways to reduce stress and to treat/manage anxiety and depression.  Written material given at graduation.   Education: Sleep Hygiene -Provides group verbal and written instruction about how sleep can affect your health.  Define sleep hygiene, discuss sleep cycles and impact of sleep habits. Review good sleep hygiene tips.    Initial Review & Psychosocial Screening:  Initial Psych Review & Screening - 05/04/20 0849      Initial Review   Current issues with None Identified       Family Dynamics   Good Support System? Yes    Comments He can look to his wife, his friends Sonia Side and his kids sometimes. His family cares for him but he would rather support them. Sherwin has a good outlook on his health.      Barriers   Psychosocial barriers to participate in program The patient should benefit from training in stress management and relaxation.;There are no identifiable barriers or psychosocial needs.      Screening Interventions   Interventions Encouraged to exercise;To provide support and resources with identified psychosocial needs;Provide feedback about the scores to participant    Expected Outcomes Short Term goal: Utilizing psychosocial counselor, staff and physician to assist with identification of specific Stressors or current issues interfering with healing process. Setting desired goal for each stressor or current issue identified.;Long Term Goal: Stressors or current issues are controlled or eliminated.;Short Term goal: Identification and review with participant of any Quality of Life or Depression concerns found by scoring the questionnaire.;Long Term goal: The participant improves quality of Life and PHQ9 Scores as seen by post scores and/or verbalization of changes           Quality of Life Scores:   Quality of Life - 05/11/20 1456      Quality of Life   Select Quality of Life      Quality of Life Scores   Health/Function Pre 18.6 %    Socioeconomic Pre 23.44 %    Psych/Spiritual Pre 23.93 %    Family Pre 28.8 %    GLOBAL Pre 22.23 %          Scores of 19 and below usually indicate a poorer quality of life in these areas.  A difference of  2-3 points is a clinically meaningful difference.  A difference of  2-3 points in the total score of the Quality of Life Index has been associated with significant improvement in overall quality of life, self-image, physical symptoms, and general health in studies assessing change in quality of  life.  PHQ-9: Recent Review Flowsheet Data    Depression screen [facility] 2/9 05/11/2020 09/10/2014   Decreased Interest 0 0   Down, Depressed, Hopeless 0 0   PHQ - 2 Score 0 0   Altered sleeping 3 0   Tired, decreased energy 1 0   Change in appetite 1 0   Feeling bad or failure about yourself  0 0   Trouble concentrating 0 0   Moving slowly or fidgety/restless 0 0   Suicidal thoughts 0 0   PHQ-9 Score 5 0   Difficult doing work/chores Somewhat difficult -     Interpretation of Total Score  Total Score Depression Severity:  1-4 = Minimal depression, 5-9 = Mild depression, 10-14 = Moderate depression, 15-19 = Moderately severe depression, 20-27 = Severe depression   Psychosocial Evaluation and Intervention:  Psychosocial Evaluation - 05/04/20 0852      Psychosocial Evaluation & Interventions   Interventions Encouraged to exercise with the program and follow exercise prescription;Stress management education;Relaxation education    Comments He can look to his wife, his friends Dorene Sorrow and his kids sometimes. His family cares for him but he would rather support them. Clerence has a good outlook on his health.    Expected Outcomes Short: Exercise regularly to support mental health and notify staff of any changes. Long: maintain mental health and well being through teaching of rehab or prescribed medications independently.    Continue Psychosocial Services  Follow up required by staff           Psychosocial Re-Evaluation:  Psychosocial Re-Evaluation    Row Name 05/24/20 [phone]             Psychosocial Re-Evaluation   Comments Patient stated that he had no new stress concerns and is sleeping better currently then he was.       Expected Outcomes Short: continue to maintain healthy sleeping patterns. Long: Maintain good metal health with positive sleep and stress management habits.       Interventions Encouraged to attend Cardiac Rehabilitation for the exercise       Continue Psychosocial  Services  Follow up required by staff              Psychosocial Discharge (Final Psychosocial Re-Evaluation):  Psychosocial Re-Evaluation - 05/24/20 0737      Psychosocial Re-Evaluation   Comments Patient stated that he had no new stress concerns and is sleeping better currently then he was.    Expected Outcomes Short: continue to maintain healthy sleeping patterns. Long: Maintain good metal health with positive sleep and stress management habits.    Interventions Encouraged to attend Cardiac Rehabilitation for the exercise    Continue Psychosocial Services  Follow up required by staff           Vocational Rehabilitation: Provide vocational rehab assistance to qualifying candidates.   Vocational Rehab Evaluation & Intervention:   Education: Education Goals: Education classes will be provided on a variety of topics geared toward better understanding of heart health and risk factor modification. Participant will state understanding/return demonstration of topics presented as noted by education test scores.  Learning Barriers/Preferences:  Learning Barriers/Preferences - 05/04/20 0848      Learning Barriers/Preferences   Learning Barriers None    Learning Preferences None  General Cardiac Education Topics:  AED/CPR: - Group verbal and written instruction with the use of models to demonstrate the basic use of the AED with the basic ABC's of resuscitation.   Anatomy and Cardiac Procedures: - Group verbal and visual presentation and models provide information about basic cardiac anatomy and function. Reviews the testing methods done to diagnose heart disease and the outcomes of the test results. Describes the treatment choices: Medical Management, Angioplasty, or Coronary Bypass Surgery for treating various heart conditions including Myocardial Infarction, Angina, Valve Disease, and Cardiac Arrhythmias.  Written material given at graduation. Flowsheet Row Cardiac  Rehab from 06/16/2020 in [facility] Cardiac and Pulmonary Rehab  Education need identified 05/11/20  Date 05/12/20  Educator SB  Instruction Review Code 1- Verbalizes Understanding      Medication Safety: - Group verbal and visual instruction to review commonly prescribed medications for heart and lung disease. Reviews the medication, class of the drug, and side effects. Includes the steps to properly store meds and maintain the prescription regimen.  Written material given at graduation.   Intimacy: - Group verbal instruction through game format to discuss how heart and lung disease can affect sexual intimacy. Written material given at graduation.. Flowsheet Row Cardiac Rehab from 09/01/2014 in [facility] Cardiac and Pul[facility] Rehab  Date 09/01/14  Educator MA  Instruction Review Code (retired) 2- meets goals/outcomes      Know Your Numbers and Heart Failure: - Group verbal and visual instruction to discuss disease risk factors for cardiac and pulmonary disease and treatment options.  Reviews associated critical values for Overweight/Obesity, Hypertension, Cholesterol, and Diabetes.  Discusses basics of heart failure: signs/symptoms and treatments.  Introduces Heart Failure Zone chart for action plan for heart failure.  Written material given at graduation.   Infection Prevention: - Provides verbal and written material to individual with discussion of infection control including proper hand washing and proper equipment cleaning during exercise session. Flowsheet Row Cardiac Rehab from 06/16/2020 in [facility] Cardiac and Pulmonary Rehab  Date 05/04/20  Educator [facility]  Instruction Review Code 1- Verbalizes Understanding      Falls Prevention: - Provides verbal and written material to individual with discussion of falls prevention and safety. Flowsheet Row Cardiac Rehab from 06/16/2020 in [facility] Cardiac and Pulmonary Rehab  Date 05/04/20  Educator [facility]  Instruction Review Code 1- Verbalizes Understanding       Other: -Provides group and verbal instruction on various topics (see comments)   Knowledge Questionnaire Score:  Knowledge Questionnaire Score - 05/11/20 1458      Knowledge Questionnaire Score   Pre Score 24/26 Education Focus: PAD, Exercise           Core Components/Risk Factors/Patient Goals at Admission:  Personal Goals and Risk Factors at Admission - 05/11/20 1458      Core Components/Risk Factors/Patient Goals on Admission    Weight Management Yes;Weight Loss;Obesity    Intervention Weight Management: Develop a combined nutrition and exercise program designed to reach desired caloric intake, while maintaining appropriate intake of nutrient and fiber, sodium and fats, and appropriate energy expenditure required for the weight goal.;Weight Management: Provide education and appropriate resources to help participant work on and attain dietary goals.;Weight Management/Obesity: Establish reasonable short term and long term weight goals.;Obesity: Provide education and appropriate resources to help participant work on and attain dietary goals.    Admit Weight 205 lb 3.2 oz (93.1 kg)    Goal Weight: Short Term 200 lb (90.7 kg)    Goal Weight: Long Term  195 lb (88.5 kg)    Expected Outcomes Short Term: Continue to assess and modify interventions until short term weight is achieved;Long Term: Adherence to nutrition and physical activity/exercise program aimed toward attainment of established weight goal;Weight Loss: Understanding of general recommendations for a balanced deficit meal plan, which promotes 1-2 lb weight loss per week and includes a negative energy balance of [phone] kcal/d;Understanding recommendations for meals to include 15-35% energy as protein, 25-35% energy from fat, 35-60% energy from carbohydrates, less than $RemoveB'200mg'bTlCZtlq$  of dietary cholesterol, 20-35 gm of total fiber daily;Understanding of distribution of calorie intake throughout the day with the consumption of 4-5  meals/snacks    Diabetes Yes    Intervention Provide education about signs/symptoms and action to take for hypo/hyperglycemia.;Provide education about proper nutrition, including hydration, and aerobic/resistive exercise prescription along with prescribed medications to achieve blood glucose in normal ranges: Fasting glucose 65-99 mg/dL    Expected Outcomes Short Term: Participant verbalizes understanding of the signs/symptoms and immediate care of hyper/hypoglycemia, proper foot care and importance of medication, aerobic/resistive exercise and nutrition plan for blood glucose control.;Long Term: Attainment of HbA1C < 7%.    Hypertension Yes    Intervention Provide education on lifestyle modifcations including regular physical activity/exercise, weight management, moderate sodium restriction and increased consumption of fresh fruit, vegetables, and low fat dairy, alcohol moderation, and smoking cessation.;Monitor prescription use compliance.    Expected Outcomes Short Term: Continued assessment and intervention until BP is < 140/66mm HG in hypertensive participants. < 130/72mm HG in hypertensive participants with diabetes, heart failure or chronic kidney disease.;Long Term: Maintenance of blood pressure at goal levels.    Lipids Yes    Intervention Provide education and support for participant on nutrition & aerobic/resistive exercise along with prescribed medications to achieve LDL '70mg'$ , HDL >$Remo'40mg'eIEtJ$ .    Expected Outcomes Short Term: Participant states understanding of desired cholesterol values and is compliant with medications prescribed. Participant is following exercise prescription and nutrition guidelines.;Long Term: Cholesterol controlled with medications as prescribed, with individualized exercise RX and with personalized nutrition plan. Value goals: LDL < $Rem'70mg'NZyU$ , HDL > 40 mg.           Education:Diabetes - Individual verbal and written instruction to review signs/symptoms of diabetes, desired  ranges of glucose level fasting, after meals and with exercise. Acknowledge that pre and post exercise glucose checks will be done for 3 sessions at entry of program. Kelly from 06/16/2020 in [name] Cardiac and Pulmonary Rehab  Date 05/04/20  Educator [facility]  Instruction Review Code 1- Verbalizes Understanding      Core Components/Risk Factors/Patient Goals Review:   Goals and Risk Factor Review    Row Name 05/24/20 0739             Core Components/Risk Factors/Patient Goals Review   Personal Goals Review Weight Management/Obesity;Hypertension;Lipids;Diabetes       Review Patient reports taking all medications as prescribed. He monitors blood sugars at home and reports numbers in acceptable ranges. His weight has increased slightly and he expressed interest in intermittent fasting. He was encouraged to be careful with his blood sugar levels and monitor them closely if he chooses to do this. He was not yet met with program dietician but will be scheduled for that in his upcoming visitis.       Expected Outcomes Short: continue to attend rehab consistently and take all medications. Long: control risk factors with heart healthy lifestyle choices.  Core Components/Risk Factors/Patient Goals at Discharge (Final Review):   Goals and Risk Factor Review - 05/24/20 0739      Core Components/Risk Factors/Patient Goals Review   Personal Goals Review Weight Management/Obesity;Hypertension;Lipids;Diabetes    Review Patient reports taking all medications as prescribed. He monitors blood sugars at home and reports numbers in acceptable ranges. His weight has increased slightly and he expressed interest in intermittent fasting. He was encouraged to be careful with his blood sugar levels and monitor them closely if he chooses to do this. He was not yet met with program dietician but will be scheduled for that in his upcoming visitis.    Expected Outcomes Short: continue to  attend rehab consistently and take all medications. Long: control risk factors with heart healthy lifestyle choices.           ITP Comments:  ITP Comments    Row Name 05/04/20 0854 05/11/20 1154 05/12/20 0653 05/12/20 0739 06/09/20 0959   ITP Comments Virtual Visit completed. Patient informed on EP and RD appointment and 6 Minute walk test. Patient also informed of patient health questionnaires on My Chart. Patient Verbalizes understanding. Visit diagnosis can be found in [facility] 04/21/2020. Completed 6MWT and gym orientation. Initial ITP created and sent for review to Dr. Emily Filbert, Medical Director. 30 Day review completed. Medical Director ITP review done, changes made as directed, and signed approval by Medical Director.  New to program First full day of exercise!  Patient was oriented to gym and equipment including functions, settings, policies, and procedures.  Patient's individual exercise prescription and treatment plan were reviewed.  All starting workloads were established based on the results of the 6 minute walk test done at initial orientation visit.  The plan for exercise progression was also introduced and progression will be customized based on patient's performance and goals. 30 Day review completed. Medical Director ITP review done, changes made as directed, and signed approval by Medical Director.   Row Name 06/30/20 [phone] 07/07/20 0614         ITP Comments Patient called out today.  He had been out previously as he was sick.  Now he is out of town taking care of his brother who is coming home from the hospital in [place].  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 30 Day review completed. Medical Director ITP review done, changes made as directed, and signed approval by Medical Director.             Comments: 30 Day review completed. Medical Director ITP review done, changes made as directed, and signed approval by Medical Director.

## 2020-07-19 ENCOUNTER — Telehealth: Payer: Self-pay

## 2020-07-19 ENCOUNTER — Encounter: Payer: Medicare PPO | Attending: Cardiovascular Disease

## 2020-07-19 DIAGNOSIS — I214 Non-ST elevation (NSTEMI) myocardial infarction: Secondary | ICD-10-CM | POA: Insufficient documentation

## 2020-07-19 NOTE — Telephone Encounter (Signed)
Called to check in to see how he was doing. He reports testing positive for COVID 07/09/20, but he feels better now and he will be back Friday - 14 days out from his positive test.

## 2020-07-21 ENCOUNTER — Encounter: Payer: Self-pay | Admitting: *Deleted

## 2020-07-21 DIAGNOSIS — I214 Non-ST elevation (NSTEMI) myocardial infarction: Secondary | ICD-10-CM

## 2020-07-23 ENCOUNTER — Encounter: Payer: Medicare PPO | Admitting: *Deleted

## 2020-07-23 ENCOUNTER — Other Ambulatory Visit: Payer: Self-pay

## 2020-07-23 DIAGNOSIS — I214 Non-ST elevation (NSTEMI) myocardial infarction: Secondary | ICD-10-CM | POA: Diagnosis present

## 2020-07-23 NOTE — Progress Notes (Signed)
Daily Session Note  Patient Details  Name: Jake Castro MRN: [id number] Date of Birth: 02-20-1951 Referring Provider:   Flowsheet Row Cardiac Rehab from 05/11/2020 in [facility] Cardiac and Pulmonary Rehab  Referring Provider [name] MD      Encounter Date: 07/23/2020  Check In:  Session Check In - 07/23/20 0837      Check-In   Supervising physician immediately available to respond to emergencies See telemetry face sheet for immediately available ER MD    Location [facility]-Cardiac & Pulmonary Rehab    Staff Present Heath Lark, RN, BSN, CCRP;Jessica [place], MA, RCEP, CCRP, CCET;Joseph Cosby RCP,RRT,BSRT    Virtual Visit No    Medication changes reported     No    Fall or balance concerns reported    No    Warm-up and Cool-down Performed on first and last piece of equipment    Resistance Training Performed Yes    VAD Patient? No    PAD/SET Patient? No      Pain Assessment   Currently in Pain? No/denies              Social History   Tobacco Use  Smoking Status Former Smoker  . Packs/day: 0.50  . Years: 1.00  . Pack years: 0.50  . Types: Cigarettes  . Quit date: 05/04/1965  . Years since quitting: 55.2  Smokeless Tobacco Never Used  Tobacco Comment   smoked for a few months in his early 20's.    Goals Met:  Independence with exercise equipment Exercise tolerated well No report of cardiac concerns or symptoms  Goals Unmet:  Not Applicable  Comments: Pt able to follow exercise prescription today without complaint.  Will continue to monitor for progression.    Dr. Emily Filbert is Medical Director for [place] and [facility].

## 2020-07-26 ENCOUNTER — Encounter: Payer: Medicare PPO | Admitting: *Deleted

## 2020-07-26 ENCOUNTER — Other Ambulatory Visit: Payer: Self-pay

## 2020-07-26 DIAGNOSIS — I214 Non-ST elevation (NSTEMI) myocardial infarction: Secondary | ICD-10-CM | POA: Diagnosis not present

## 2020-07-26 NOTE — Progress Notes (Signed)
Daily Session Note  Patient Details  Name: KEION NEELS MRN: [id number] Date of Birth: 1951-02-17 Referring Provider:   Flowsheet Row Cardiac Rehab from 05/11/2020 in [facility] Cardiac and Pulmonary Rehab  Referring Provider [name] MD      Encounter Date: 07/26/2020  Check In:  Session Check In - 07/26/20 0845      Check-In   Supervising physician immediately available to respond to emergencies See telemetry face sheet for immediately available ER MD    Location [facility]-Cardiac & Pulmonary Rehab    Staff Present Earlean Shawl, BS, ACSM CEP, Exercise Physiologist;Joseph Flavia Shipper;Heath Lark, RN, BSN, CCRP    Virtual Visit No    Medication changes reported     No    Fall or balance concerns reported    No    Warm-up and Cool-down Performed on first and last piece of equipment    Resistance Training Performed Yes    VAD Patient? No    PAD/SET Patient? No      Pain Assessment   Currently in Pain? No/denies              Social History   Tobacco Use  Smoking Status Former Smoker  . Packs/day: 0.50  . Years: 1.00  . Pack years: 0.50  . Types: Cigarettes  . Quit date: 05/04/1965  . Years since quitting: 55.2  Smokeless Tobacco Never Used  Tobacco Comment   smoked for a few months in his early 20's.    Goals Met:  Independence with exercise equipment Personal goals reviewed No report of cardiac concerns or symptoms  Goals Unmet:  Not Applicable  Comments: Pt able to follow exercise prescription today without complaint.  Will continue to monitor for progression.    Dr. Emily Filbert is Medical Director for [place] and [facility].

## 2020-07-28 ENCOUNTER — Other Ambulatory Visit: Payer: Self-pay

## 2020-07-28 DIAGNOSIS — I214 Non-ST elevation (NSTEMI) myocardial infarction: Secondary | ICD-10-CM | POA: Diagnosis not present

## 2020-07-28 NOTE — Progress Notes (Signed)
Daily Session Note  Patient Details  Name: Jake Castro MRN: [id number] Date of Birth: 1950-08-11 Referring Provider:   Flowsheet Row Cardiac Rehab from 05/11/2020 in [facility] Cardiac and Pulmonary Rehab  Referring Provider [name] MD      Encounter Date: 07/28/2020  Check In:  Session Check In - 07/28/20 0949      Check-In   Supervising physician immediately available to respond to emergencies See telemetry face sheet for immediately available ER MD    Location [facility]-Cardiac & Pulmonary Rehab    Staff Present Birdie Sons, MPA, Elveria Rising, BA, ACSM CEP, Exercise Physiologist;Joseph Tessie Fass RCP,RRT,BSRT    Virtual Visit No    Medication changes reported     No    Fall or balance concerns reported    No    Warm-up and Cool-down Performed on first and last piece of equipment    Resistance Training Performed Yes    VAD Patient? No    PAD/SET Patient? No      Pain Assessment   Currently in Pain? No/denies              Social History   Tobacco Use  Smoking Status Former Smoker  . Packs/day: 0.50  . Years: 1.00  . Pack years: 0.50  . Types: Cigarettes  . Quit date: 05/04/1965  . Years since quitting: 55.2  Smokeless Tobacco Never Used  Tobacco Comment   smoked for a few months in his early 20's.    Goals Met:  Independence with exercise equipment Exercise tolerated well Strength training completed today  Goals Unmet:  Not Applicable  Comments: Pt able to follow exercise prescription today. Patient complained of weight gain over last three months. Also experienced short period of chest discomfort of 2/10 pain. Discomfort passed and was able to continue exercising and attend education without incident. Patient's rhythm recordings and record of weights since beginning rehab were sent to Dr. Laurelyn Sickle office requesting a response from the doctor. Will continue to monitor for progression.    Dr. Emily Filbert is Medical Director for [name] and [facility].

## 2020-08-04 ENCOUNTER — Encounter: Payer: Self-pay | Admitting: *Deleted

## 2020-08-04 ENCOUNTER — Other Ambulatory Visit: Payer: Self-pay

## 2020-08-04 DIAGNOSIS — I214 Non-ST elevation (NSTEMI) myocardial infarction: Secondary | ICD-10-CM | POA: Diagnosis not present

## 2020-08-04 NOTE — Progress Notes (Signed)
Cardiac Individual Treatment Plan  Patient Details  Name: Jake Castro MRN: [id number] Date of Birth: June 14, 1950 Referring Provider:   Flowsheet Row Cardiac Rehab from 05/11/2020 in [facility] Cardiac and Pulmonary Rehab  Referring Provider [name] MD      Initial Encounter Date:  Flowsheet Row Cardiac Rehab from 05/11/2020 in [facility] Cardiac and Pulmonary Rehab  Date 05/11/20      Visit Diagnosis: NSTEMI (non-ST elevated myocardial infarction) [facility])  Patient's Home Medications on Admission:  Current Outpatient Medications:  .  acetaminophen (TYLENOL) 325 MG tablet, Take 2 tablets (650 mg total) by mouth every 6 (six) hours as needed for mild pain (or Fever >/= 101). (Patient not taking: Reported on 05/04/2020), Disp: , Rfl:  .  Ascorbic Acid (VITAMIN C) 1000 MG tablet, Take 1,000 mg by mouth daily. (Patient not taking: No sig reported), Disp: , Rfl:  .  aspirin 325 MG tablet, Take by mouth., Disp: , Rfl:  .  aspirin 81 MG tablet, Take 81 mg by mouth daily. Am (Patient not taking: No sig reported), Disp: , Rfl:  .  Blood Glucose Monitoring Suppl (ONETOUCH VERIO FLEX SYSTEM) w/Device KIT, U UTD TO TEST BLOOD SUGAR, Disp: , Rfl:  .  carvedilol (COREG) 12.5 MG tablet, Take 12.5 mg by mouth 2 (two) times daily with a meal., Disp: , Rfl:  .  chlorhexidine (PERIDEX) 0.12 % solution, chlorhexidine gluconate 0.12 % mouthwash  RINSE AND SPIT WITH 1/2 OZ FOR 30 SECONDS BID FOR 2 WEEKS, Disp: , Rfl:  .  Cholecalciferol (VITAMIN D-3) 5000 UNITS TABS, Take 5,000 mg by mouth daily., Disp: , Rfl:  .  Exenatide ER 2 MG SRER, Inject into the skin once a week. (Patient not taking: Reported on 05/04/2020), Disp: , Rfl:  .  fenofibrate (TRICOR) 145 MG tablet, Take 145 mg by mouth daily., Disp: , Rfl:  .  ferrous sulfate 325 (65 FE) MG tablet, Take by mouth. (Patient not taking: Reported on 05/04/2020), Disp: , Rfl:  .  glucose blood (ONETOUCH ULTRA) test strip, U UTD TO TEST BLOOD SUGAR (Patient not taking:  Reported on 05/04/2020), Disp: , Rfl:  .  glucose blood (PRECISION QID TEST) test strip, U UTD TO TEST BLOOD SUGAR (Patient not taking: Reported on 05/04/2020), Disp: , Rfl:  .  isosorbide mononitrate (IMDUR) 30 MG 24 hr tablet, Take 1 tablet (30 mg total) by mouth daily., Disp: 30 tablet, Rfl: 0 .  KRILL OIL PO, Take 1,000 mg by mouth daily.  (Patient not taking: No sig reported), Disp: , Rfl:  .  l-methylfolate-B6-B12 (METANX) 3-35-2 MG TABS tablet, Take 1 tablet by mouth daily. am (Patient not taking: No sig reported), Disp: , Rfl:  .  lisinopril (PRINIVIL,ZESTRIL) 40 MG tablet, Take 40 mg by mouth daily. am, Disp: , Rfl:  .  metFORMIN (GLUCOPHAGE) 1000 MG tablet, Take 1,000 mg by mouth 2 (two) times daily with a meal. Am and bedtime, Disp: , Rfl:  .  Methylcobalamin 1 MG CHEW, Chew by mouth. (Patient not taking: No sig reported), Disp: , Rfl:  .  Multiple Vitamin (MULTIVITAMIN) capsule, Take 1 capsule by mouth daily. (Patient not taking: No sig reported), Disp: , Rfl:  .  Omega-3 Fatty Acids (FISH OIL) 1000 MG CAPS, omega-3 acid ethyl esters 1 gram capsule, Disp: , Rfl:  .  rosuvastatin (CRESTOR) 40 MG tablet, Take 1 tablet (40 mg total) by mouth daily., Disp: 30 tablet, Rfl: 0 .  Semaglutide,0.25 or 0.5MG /DOS, (OZEMPIC, 0.25 OR 0.5  MG/DOSE,) 2 MG/1.5ML SOPN, Ozempic 0.25 mg or 0.5 mg (2 mg/1.5 mL) subcutaneous pen injector, Disp: , Rfl:  .  Specialty Vitamins Products (PROSTATE PO), Take by mouth daily. B6 $RemoveB'10mg'jKKazEjn$  and zinc $Remove'30mg'JsGKRFt$  (Patient not taking: No sig reported), Disp: , Rfl:  .  ticagrelor (BRILINTA) 90 MG TABS tablet, Take 1 tablet (90 mg total) by mouth 2 (two) times daily., Disp: 60 tablet, Rfl: 0 .  triamcinolone cream (KENALOG) 0.5 %, triamcinolone acetonide 0.5 % topical cream, Disp: , Rfl:  .  Ubiquinol 200 MG CAPS, Take by mouth daily. (Patient not taking: No sig reported), Disp: , Rfl:  .  VITAMIN K PO, Take 1 tablet by mouth daily., Disp: , Rfl:  No current facility-administered  medications for this visit.  Facility-Administered Medications Ordered in Other Visits:  .  sodium chloride flush (NS) 0.9 % injection 3 mL, 3 mL, Intravenous, Q12H, Dionisio [name], MD  Past Medical History: Past Medical History:  Diagnosis Date  . CAD (coronary artery disease)    a. 12/2006 NSTEMI/PCI: RCA 18m/d->mid vessel stented, unable to cross dist dzs w/ balloon;  b. 11/2013 NL MV;  c. 11/2013 Echo: EF 55%;  d. 12/2013 Cath: LM 95 ost, LAD 70p, 13m, LCX 30p/m, OM1 60, RCA 20p, 3m/74m, 95d.  . Carotid arterial disease ([place])    a. s/p LCEA  . Diabetes mellitus ([place])    type 2  . HTN (hypertension)   . Hyperlipidemia   . Hypertension   . Morbid obesity ([place])   . Myocardial infarction ([place])   . Neuromuscular disorder ([facility])    neuropathy left foot  . Neuropathy    LEFT FOOT  . Plantar fasciitis   . Pneumonia   . Wears dentures    full upper and lower partial    Tobacco Use: Social History   Tobacco Use  Smoking Status Former Smoker  . Packs/day: 0.50  . Years: 1.00  . Pack years: 0.50  . Types: Cigarettes  . Quit date: 05/04/1965  . Years since quitting: 55.2  Smokeless Tobacco Never Used  Tobacco Comment   smoked for a few months in his early 20's.    Labs: Recent Review Flowsheet Data    Labs for ITP Cardiac and Pulmonary Rehab Latest Ref Rng & Units 01/03/2014 01/03/2014 01/04/2014 04/21/2020 04/22/2020   Cholestrol 0 - 200 mg/dL - - - - 121   LDLCALC 0 - 99 mg/dL - - - - 46   HDL >40 mg/dL - - - - 46   Trlycerides <150 mg/dL - - - - 146   Hemoglobin A1c 4.8 - 5.6 % - - - 6.7(H) -   PHART 7.350 - 7.450 7.344(L) - - - -   PCO2ART 35.0 - 45.0 mmHg 52.4(H) - - - -   HCO3 20.0 - 28.0 mmol/L 28.5(H) - - 29.2(H) -   TCO2 0 - 100 mmol/L $RemoveBe'30 30 26 'kfSCGnFfO$ - -   ACIDBASEDEF 0.0 - 2.0 mmol/L - - - - -   O2SAT % 95.0 - - 60.8 -       Exercise Target Goals: Exercise Program Goal: Individual exercise prescription set using results from initial 6 min walk test and THRR while  considering  patient's activity barriers and safety.   Exercise Prescription Goal: Initial exercise prescription builds to 30-45 minutes a day of aerobic activity, 2-3 days per week.  Home exercise guidelines will be given to patient during program as part of exercise prescription that the participant will acknowledge.  Education: Aerobic Exercise: - Group verbal and visual presentation on the components of exercise prescription. Introduces F.I.T.T principle from ACSM for exercise prescriptions.  Reviews F.I.T.T. principles of aerobic exercise including progression. Written material given at graduation. Flowsheet Row Cardiac Rehab from 07/28/2020 in [facility] Cardiac and Pulmonary Rehab  Education need identified 05/11/20      Education: Resistance Exercise: - Group verbal and visual presentation on the components of exercise prescription. Introduces F.I.T.T principle from ACSM for exercise prescriptions  Reviews F.I.T.T. principles of resistance exercise including progression. Written material given at graduation. Flowsheet Row Cardiac Rehab from 07/28/2020 in [facility] Cardiac and Pulmonary Rehab  Date 05/12/20  Educator [facility]  Instruction Review Code 1- Verbalizes Understanding       Education: Exercise & Equipment Safety: - Individual verbal instruction and demonstration of equipment use and safety with use of the equipment.   Education: Exercise Physiology & General Exercise Guidelines: - Group verbal and written instruction with models to review the exercise physiology of the cardiovascular system and associated critical values. Provides general exercise guidelines with specific guidelines to those with heart or lung disease.    Education: Flexibility, Balance, Mind/Body Relaxation: - Group verbal and visual presentation with interactive activity on the components of exercise prescription. Introduces F.I.T.T principle from ACSM for exercise prescriptions. Reviews F.I.T.T. principles of  flexibility and balance exercise training including progression. Also discusses the mind body connection.  Reviews various relaxation techniques to help reduce and manage stress (i.e. Deep breathing, progressive muscle relaxation, and visualization). Balance handout provided to take home. Written material given at graduation. Flowsheet Row Cardiac Rehab from 07/28/2020 in [facility] Cardiac and Pulmonary Rehab  Date 05/19/20  Educator AS  Instruction Review Code 1- Verbalizes Understanding      Activity Barriers & Risk Stratification:  Activity Barriers & Cardiac Risk Stratification - 05/11/20 1158      Activity Barriers & Cardiac Risk Stratification   Activity Barriers Deconditioning;Muscular Weakness;Balance Concerns    Cardiac Risk Stratification High           6 Minute Walk:  6 Minute Walk    Row Name 05/11/20 1156         6 Minute Walk   Phase Initial     Distance 1435 feet     Walk Time 6 minutes     # of Rest Breaks 0     MPH 2.72     METS 3.22     RPE 15     Perceived Dyspnea  1     VO2 Peak 11.27     Symptoms Yes (comment)     Comments R leg calf pain and L thigh pain 4/10     Resting HR 76 bpm     Resting BP 132/70     Resting Oxygen Saturation  98 %     Exercise Oxygen Saturation  during 6 min walk 98 %     Max Ex. HR 106 bpm     Max Ex. BP 142/74     2 Minute Post BP 124/62            Oxygen Initial Assessment:   Oxygen Re-Evaluation:   Oxygen Discharge (Final Oxygen Re-Evaluation):   Initial Exercise Prescription:  Initial Exercise Prescription - 05/11/20 1100      Date of Initial Exercise RX and Referring Provider   Date 05/11/20    Referring Provider [name] MD      Treadmill   MPH 2.5    Grade 0.5  Minutes 15    METs 3.09      Recumbant Bike   Level 3    RPM 50    Watts 36    Minutes 15    METs 3      NuStep   Level 3    SPM 80    Minutes 15    METs 3      T5 Nustep   Level 3    SPM 80    Minutes 15    METs 3       Prescription Details   Frequency (times per week) 3    Duration Progress to 30 minutes of continuous aerobic without signs/symptoms of physical distress      Intensity   THRR 40-80% of Max Heartrate 105-134    Ratings of Perceived Exertion 11-13    Perceived Dyspnea 0-4      Progression   Progression Continue to progress workloads to maintain intensity without signs/symptoms of physical distress.      Resistance Training   Training Prescription Yes    Weight 4 lb    Reps 10-15           Perform Capillary Blood Glucose checks as needed.  Exercise Prescription Changes:  Exercise Prescription Changes    Row Name 05/11/20 1100 05/24/20 1500 06/09/20 1400 06/22/20 1500 08/02/20 0900     Response to Exercise   Blood Pressure (Admit) 132/70 134/70 118/60 126/70 122/58   Blood Pressure (Exercise) 142/74 142/76 148/60 174/68 138/60   Blood Pressure (Exit) 124/60 124/62 96/52 112/62 112/56   Heart Rate (Admit) 76 bpm 63 bpm 78 bpm 73 bpm 73 bpm   Heart Rate (Exercise) 106 bpm 116 bpm 129 bpm 133 bpm 112 bpm   Heart Rate (Exit) 75 bpm 83 bpm 83 bpm 108 bpm 85 bpm   Oxygen Saturation (Admit) 98 % -- -- -- --   Oxygen Saturation (Exercise) 98 % -- -- -- --   Rating of Perceived Exertion (Exercise) 15 13 14 14 15    Perceived Dyspnea (Exercise) 1 -- -- -- --   Symptoms R calf pain, L thigh pain 4/10 none none none none   Comments walk test results -- -- -- --   Duration -- Continue with 30 min of aerobic exercise without signs/symptoms of physical distress. Continue with 30 min of aerobic exercise without signs/symptoms of physical distress. Continue with 30 min of aerobic exercise without signs/symptoms of physical distress. Continue with 30 min of aerobic exercise without signs/symptoms of physical distress.   Intensity -- THRR unchanged THRR unchanged THRR unchanged THRR unchanged     Progression   Progression -- Continue to progress workloads to maintain intensity without  signs/symptoms of physical distress. Continue to progress workloads to maintain intensity without signs/symptoms of physical distress. Continue to progress workloads to maintain intensity without signs/symptoms of physical distress. Continue to progress workloads to maintain intensity without signs/symptoms of physical distress.   Average METs -- 3.81 4.2 4.87 4.6     Resistance Training   Training Prescription -- Yes Yes Yes Yes   Weight -- 8 lb 8 lb 12 lb 10 lb   Reps -- 10-15 10-15 10-15 10-15     Interval Training   Interval Training -- No No No No     Treadmill   MPH -- 3 3 3.5 --   Grade -- 3 2 3  --   Minutes -- 15 15 15  --   METs -- 4.54 4.12 5.13 --  Recumbant Bike   Level -- -- -- 4 --   Minutes -- -- -- 15 --     NuStep   Level -- 5 -- 8 --   Minutes -- 15 -- 15 --   METs -- 4.6 -- 4.5 --     REL-XR   Level -- -- -- -- 12   Minutes -- -- -- -- 15   METs -- -- -- -- 4.12     T5 Nustep   Level -- 5 6 -- --   Minutes -- 15 15 -- --   METs -- 3.1 4.3 -- --     Biostep-RELP   Level -- 3 -- 7 --   Minutes -- 15 -- 15 --   METs -- 3 -- 5 --          Exercise Comments:  Exercise Comments    Row Name 05/12/20 0739 07/28/20 1058         Exercise Comments First full day of exercise!  Patient was oriented to gym and equipment including functions, settings, policies, and procedures.  Patient's individual exercise prescription and treatment plan were reviewed.  All starting workloads were established based on the results of the 6 minute walk test done at initial orientation visit.  The plan for exercise progression was also introduced and progression will be customized based on patient's performance and goals. Pt able to follow exercise prescription today. Patient complained of weight gain over last three months. Also experienced short period of chest discomfort of 2/10 pain. Discomfort passed and was able to continue exercising and attend education without incident.  Patient's rhythm recordings and record of weights since beginning rehab were sent to Dr. Milta Deiters office requesting a response from the doctor. Will continue to monitor for progression.             Exercise Goals and Review:  Exercise Goals    Row Name 05/11/20 1454             Exercise Goals   Increase Physical Activity Yes       Intervention Provide advice, education, support and counseling about physical activity/exercise needs.;Develop an individualized exercise prescription for aerobic and resistive training based on initial evaluation findings, risk stratification, comorbidities and participant's personal goals.       Expected Outcomes Short Term: Attend rehab on a regular basis to increase amount of physical activity.;Long Term: Exercising regularly at least 3-5 days a week.;Long Term: Add in home exercise to make exercise part of routine and to increase amount of physical activity.       Increase Strength and Stamina Yes       Intervention Provide advice, education, support and counseling about physical activity/exercise needs.;Develop an individualized exercise prescription for aerobic and resistive training based on initial evaluation findings, risk stratification, comorbidities and participant's personal goals.       Expected Outcomes Short Term: Increase workloads from initial exercise prescription for resistance, speed, and METs.;Short Term: Perform resistance training exercises routinely during rehab and add in resistance training at home;Long Term: Improve cardiorespiratory fitness, muscular endurance and strength as measured by increased METs and functional capacity ( )       Able to understand and use rate of perceived exertion (RPE) scale Yes       Intervention Provide education and explanation on how to use RPE scale       Expected Outcomes Short Term: Able to use RPE daily in rehab to express subjective intensity level;Long Term:  Able to use RPE to guide intensity level  when exercising independently       Able to understand and use Dyspnea scale Yes       Intervention Provide education and explanation on how to use Dyspnea scale       Expected Outcomes Short Term: Able to use Dyspnea scale daily in rehab to express subjective sense of shortness of breath during exertion;Long Term: Able to use Dyspnea scale to guide intensity level when exercising independently       Knowledge and understanding of Target Heart Rate Range (THRR) Yes       Intervention Provide education and explanation of THRR including how the numbers were predicted and where they are located for reference       Expected Outcomes Short Term: Able to state/look up THRR;Short Term: Able to use daily as guideline for intensity in rehab;Long Term: Able to use THRR to govern intensity when exercising independently       Able to check pulse independently Yes       Intervention Provide education and demonstration on how to check pulse in carotid and radial arteries.;Review the importance of being able to check your own pulse for safety during independent exercise       Expected Outcomes Short Term: Able to explain why pulse checking is important during independent exercise;Long Term: Able to check pulse independently and accurately       Understanding of Exercise Prescription Yes       Intervention Provide education, explanation, and written materials on patient's individual exercise prescription       Expected Outcomes Short Term: Able to explain program exercise prescription;Long Term: Able to explain home exercise prescription to exercise independently              Exercise Goals Re-Evaluation :  Exercise Goals Re-Evaluation    Row Name 05/12/20 0739 05/24/20 0744 06/09/20 1407 06/22/20 1527 06/30/20 0747     Exercise Goal Re-Evaluation   Exercise Goals Review Increase Physical Activity;Able to understand and use rate of perceived exertion (RPE) scale;Knowledge and understanding of Target Heart  Rate Range (THRR);Understanding of Exercise Prescription;Able to understand and use Dyspnea scale;Able to check pulse independently;Increase Strength and Stamina Increase Physical Activity;Able to understand and use rate of perceived exertion (RPE) scale;Knowledge and understanding of Target Heart Rate Range (THRR);Understanding of Exercise Prescription;Increase Strength and Stamina;Able to check pulse independently;Able to understand and use Dyspnea scale -- Increase Physical Activity;Increase Strength and Stamina;Understanding of Exercise Prescription --   Comments Reviewed RPE and dyspnea scales, THR and program prescription with pt today.  Pt voiced understanding and was given a copy of goals to take home. Patient has been gradually increasing workloads on machines in class to keep levels at a challenging intenstity. He works with varitiety of speed/elevation combinations on the TM. He stated that he feels he is getting stronger and doing will with his exercise. All vital signs during exercise have been in acceptable ranges. [name] has just returned after vacation where he did a lot of walking. He did increase level on T5 today.  His average MET level was 4.2.  Staff will monitor progress. [name] is doing well in rehab.  He is now up to 3.5 mph on the treadmill at 3%.  He really enjoys pushing himself on the treadmill.  We will continue to monitor his progress. Out since last review   Expected Outcomes Short: Use RPE daily to regulate intensity. Long: Follow program prescription in THR. Short:  continue to attend cardiac rehab classes consistently and make gradual progression with exercise intensity. Long: become independent with execise progam. Short:  get back to regular attendance Long:  improve overall stamina Short: Continue increase workloads Long; Continue to improve stamina --   Row Name 07/21/20 0901 07/26/20 0804 08/02/20 0943         Exercise Goal Re-Evaluation   Exercise Goals Review -- Increase  Physical Activity;Increase Strength and Stamina;Understanding of Exercise Prescription Increase Physical Activity;Increase Strength and Stamina     Comments Out since last review.  Last day attended 4/4 due to family and then COVID + [name] is back in rehab starting back last Friday.  He continues to stay active at home.  He goes to the dog park daily with his dog and walks while they play.  He does 6 laps with calesthenics.  He is feeling better overall and rebuilding back to his pre COVID stamina.  Prior to COVID, his stamina was doing much better. [name] is getting back to regular attendance.  He uses 10 lb weights for strength work.  Staff will monitor progress.     Expected Outcomes -- Short: Continue to return to rehab regularly Long: Continue to improve stamina. Short: get back to consistent attendance Long:  build overall stamina and MET level            Discharge Exercise Prescription (Final Exercise Prescription Changes):  Exercise Prescription Changes - 08/02/20 0900      Response to Exercise   Blood Pressure (Admit) 122/58    Blood Pressure (Exercise) 138/60    Blood Pressure (Exit) 112/56    Heart Rate (Admit) 73 bpm    Heart Rate (Exercise) 112 bpm    Heart Rate (Exit) 85 bpm    Rating of Perceived Exertion (Exercise) 15    Symptoms none    Duration Continue with 30 min of aerobic exercise without signs/symptoms of physical distress.    Intensity THRR unchanged      Progression   Progression Continue to progress workloads to maintain intensity without signs/symptoms of physical distress.    Average METs 4.6      Resistance Training   Training Prescription Yes    Weight 10 lb    Reps 10-15      Interval Training   Interval Training No      REL-XR   Level 12    Minutes 15    METs 4.12           Nutrition:  Target Goals: Understanding of nutrition guidelines, daily intake of sodium '1500mg'$ , cholesterol '200mg'$ , calories 30% from fat and 7% or less from saturated fats,  daily to have 5 or more servings of fruits and vegetables.  Education: All About Nutrition: -Group instruction provided by verbal, written material, interactive activities, discussions, models, and posters to present general guidelines for heart healthy nutrition including fat, fiber, MyPlate, the role of sodium in heart healthy nutrition, utilization of the nutrition label, and utilization of this knowledge for meal planning. Follow up email sent as well. Written material given at graduation. Flowsheet Row Cardiac Rehab from 07/28/2020 in [facility] Cardiac and Pulmonary Rehab  Date 07/28/20  Educator [facility]  Instruction Review Code 1- Verbalizes Understanding      Biometrics:  Pre Biometrics - 05/11/20 1456      Pre Biometrics   Height 5' 11.1" (1.806 m)    Weight 205 lb 3.2 oz (93.1 kg)    BMI (Calculated) 28.54    Single  Leg Stand 9.5 seconds            Nutrition Therapy Plan and Nutrition Goals:  Nutrition Therapy & Goals - 05/24/20 0834      Personal Nutrition Goals   Comments [name] would like to talk to RD regarding diet - going away for a couple of weeks. He is interested in intermittent fasting - 5pm to 9am : discussed making sure to honor hunger and meet needs to reduce risk of muscle loss. partial meal recall --> B: 2 eggs with salsa L: [place] sandwich           Nutrition Assessments:  MEDIFICTS Score Key:  ?70 Need to make dietary changes   40-70 Heart Healthy Diet  ? 40 Therapeutic Level Cholesterol Diet  Flowsheet Row Cardiac Rehab from 05/11/2020 in [facility] Cardiac and Pulmonary Rehab  Picture Your Plate Total Score on Admission 53     Picture Your Plate Scores:  <15 Unhealthy dietary pattern with much room for improvement.  41-50 Dietary pattern unlikely to meet recommendations for good health and room for improvement.  51-60 More healthful dietary pattern, with some room for improvement.   >60 Healthy dietary pattern, although there may be some specific  behaviors that could be improved.    Nutrition Goals Re-Evaluation:  Nutrition Goals Re-Evaluation    Row Name 05/24/20 0749 07/26/20 0810           Goals   Nutrition Goal -- Meet with dietician and work on weight loss      Comment Has not yet met with program dietician. Appointment will be scheduled with program dietician in his upcoming visits. [name] still has not met with program dieticain as he was out most of last month with family and COVID.  He has gained almost 14 lbs in the last month while he was out.  He seems to be missing his hunger cues as he states that he will eat a meal and then about 10-15 min later will eat again.  We talked about trying to curb his appetite with gum or mint after a meal.  Also talked about listening to his hunger cures and not just eating constantly.  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      Expected Outcome Short: meet with program dietician to set nutrician goals. Long: follow goals set by patient and program dieticia Short: Meet with dietician Long: Work on hunger cues             Nutrition Goals Discharge (Final Nutrition Goals Re-Evaluation):  Nutrition Goals Re-Evaluation - 07/26/20 0810      Goals   Nutrition Goal Meet with dietician and work on weight loss    Comment [name] still has not met with program dieticain as he was out most of last month with family and COVID.  He has gained almost 14 lbs in the last month while he was out.  He seems to be missing his hunger cues as he states that he will eat a meal and then about 10-15 min later will eat again.  We talked about trying to curb his appetite with gum or mint after a meal.  Also talked about listening to his hunger cures and not just eating constantly.  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    Expected Outcome Short: Meet with dietician Long: Work on hunger cues  Psychosocial: Target Goals: Acknowledge  presence or absence of significant depression and/or stress, maximize coping skills, provide positive support system. Participant is able to verbalize types and ability to use techniques and skills needed for reducing stress and depression.   Education: Stress, Anxiety, and Depression - Group verbal and visual presentation to define topics covered.  Reviews how body is impacted by stress, anxiety, and depression.  Also discusses healthy ways to reduce stress and to treat/manage anxiety and depression.  Written material given at graduation.   Education: Sleep Hygiene -Provides group verbal and written instruction about how sleep can affect your health.  Define sleep hygiene, discuss sleep cycles and impact of sleep habits. Review good sleep hygiene tips.    Initial Review & Psychosocial Screening:  Initial Psych Review & Screening - 05/04/20 0849      Initial Review   Current issues with None Identified      Family Dynamics   Good Support System? Yes    Comments He can look to his wife, his friends Dorene Sorrow and his kids sometimes. His family cares for him but he would rather support them. Bodhi has a good outlook on his health.      Barriers   Psychosocial barriers to participate in program The patient should benefit from training in stress management and relaxation.;There are no identifiable barriers or psychosocial needs.      Screening Interventions   Interventions Encouraged to exercise;To provide support and resources with identified psychosocial needs;Provide feedback about the scores to participant    Expected Outcomes Short Term goal: Utilizing psychosocial counselor, staff and physician to assist with identification of specific Stressors or current issues interfering with healing process. Setting desired goal for each stressor or current issue identified.;Long Term Goal: Stressors or current issues are controlled or eliminated.;Short Term goal: Identification and review with  participant of any Quality of Life or Depression concerns found by scoring the questionnaire.;Long Term goal: The participant improves quality of Life and PHQ9 Scores as seen by post scores and/or verbalization of changes           Quality of Life Scores:   Quality of Life - 05/11/20 1456      Quality of Life   Select Quality of Life      Quality of Life Scores   Health/Function Pre 18.6 %    Socioeconomic Pre 23.44 %    Psych/Spiritual Pre 23.93 %    Family Pre 28.8 %    GLOBAL Pre 22.23 %          Scores of 19 and below usually indicate a poorer quality of life in these areas.  A difference of  2-3 points is a clinically meaningful difference.  A difference of 2-3 points in the total score of the Quality of Life Index has been associated with significant improvement in overall quality of life, self-image, physical symptoms, and general health in studies assessing change in quality of life.  PHQ-9: Recent Review Flowsheet Data    Depression screen [facility] 2/9 05/11/2020 09/10/2014   Decreased Interest 0 0   Down, Depressed, Hopeless 0 0   PHQ - 2 Score 0 0   Altered sleeping 3 0   Tired, decreased energy 1 0   Change in appetite 1 0   Feeling bad or failure about yourself  0 0   Trouble concentrating 0 0   Moving slowly or fidgety/restless 0 0   Suicidal thoughts 0 0   PHQ-9 Score 5 0  Difficult doing work/chores Somewhat difficult -     Interpretation of Total Score  Total Score Depression Severity:  1-4 = Minimal depression, 5-9 = Mild depression, 10-14 = Moderate depression, 15-19 = Moderately severe depression, 20-27 = Severe depression   Psychosocial Evaluation and Intervention:  Psychosocial Evaluation - 05/04/20 0852      Psychosocial Evaluation & Interventions   Interventions Encouraged to exercise with the program and follow exercise prescription;Stress management education;Relaxation education    Comments He can look to his wife, his friends Sonia Side and his kids  sometimes. His family cares for him but he would rather support them. Cristo has a good outlook on his health.    Expected Outcomes Short: Exercise regularly to support mental health and notify staff of any changes. Long: maintain mental health and well being through teaching of rehab or prescribed medications independently.    Continue Psychosocial Services  Follow up required by staff           Psychosocial Re-Evaluation:  Psychosocial Re-Evaluation    [place] Name 05/24/20 0737 07/26/20 0807           Psychosocial Re-Evaluation   Current issues with -- Current Stress Concerns      Comments Patient stated that he had no new stress concerns and is sleeping better currently then he was. [name] is doing well mentally. 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  He is also finishing up classes this week and looking forward to his summer break.  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  He feels that he is doing well with it.  He sleeps well most nights.  He is concerned over his weight gain and headaches he has been having since COVID. He was encouraged to talk to doctor about both of these.      Expected Outcomes Short: continue to maintain healthy sleeping patterns. Long: Maintain good metal health with positive sleep and stress management habits. Short; Talk to doctor about weight and headaches Long: Continue to work on book proposal      Interventions Encouraged to attend Cardiac Rehabilitation for the exercise Encouraged to attend Cardiac Rehabilitation for the exercise      Continue Psychosocial Services  Follow up required by staff Follow up required by staff             Psychosocial Discharge (Final Psychosocial Re-Evaluation):  Psychosocial Re-Evaluation - 07/26/20 0807      Psychosocial Re-Evaluation   Current issues with Current Stress Concerns    Comments [name] is doing well mentally. He is very stressed while working on a book  proposal, but also very excited about it.  He is also finishing up classes this week and looking forward to his summer break.  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  He feels that he is doing well with it.  He sleeps well most nights.  He is concerned over his weight gain and headaches he has been having since COVID. He was encouraged to talk to doctor about both of these.    Expected Outcomes Short; Talk to doctor about weight and headaches Long: Continue to work on book proposal    Interventions Encouraged to attend Cardiac Rehabilitation for the exercise    Continue Psychosocial Services  Follow up required by staff           Vocational Rehabilitation: Provide vocational rehab assistance to  qualifying candidates.   Vocational Rehab Evaluation & Intervention:   Education: Education Goals: Education classes will be provided on a variety of topics geared toward better understanding of heart health and risk factor modification. Participant will state understanding/return demonstration of topics presented as noted by education test scores.  Learning Barriers/Preferences:  Learning Barriers/Preferences - 05/04/20 0848      Learning Barriers/Preferences   Learning Barriers None    Learning Preferences None           General Cardiac Education Topics:  AED/CPR: - Group verbal and written instruction with the use of models to demonstrate the basic use of the AED with the basic ABC's of resuscitation.   Anatomy and Cardiac Procedures: - Group verbal and visual presentation and models provide information about basic cardiac anatomy and function. Reviews the testing methods done to diagnose heart disease and the outcomes of the test results. Describes the treatment choices: Medical Management, Angioplasty, or Coronary Bypass Surgery for treating various heart conditions including Myocardial Infarction, Angina, Valve Disease, and Cardiac Arrhythmias.  Written material given at  graduation. Flowsheet Row Cardiac Rehab from 07/28/2020 in [facility] Cardiac and Pulmonary Rehab  Education need identified 05/11/20  Date 05/12/20  Educator SB  Instruction Review Code 1- Verbalizes Understanding      Medication Safety: - Group verbal and visual instruction to review commonly prescribed medications for heart and lung disease. Reviews the medication, class of the drug, and side effects. Includes the steps to properly store meds and maintain the prescription regimen.  Written material given at graduation.   Intimacy: - Group verbal instruction through game format to discuss how heart and lung disease can affect sexual intimacy. Written material given at graduation.. Flowsheet Row Cardiac Rehab from 09/01/2014 in [facility] Cardiac and Pulmonary Rehab  Date 09/01/14  Educator MA  Instruction Review Code (retired) 2- meets goals/outcomes      Know Your Numbers and Heart Failure: - Group verbal and visual instruction to discuss disease risk factors for cardiac and pulmonary disease and treatment options.  Reviews associated critical values for Overweight/Obesity, Hypertension, Cholesterol, and Diabetes.  Discusses basics of heart failure: signs/symptoms and treatments.  Introduces Heart Failure Zone chart for action plan for heart failure.  Written material given at graduation.   Infection Prevention: - Provides verbal and written material to individual with discussion of infection control including proper hand washing and proper equipment cleaning during exercise session. Flowsheet Row Cardiac Rehab from 07/28/2020 in [facility] Cardiac and Pulmonary Rehab  Date 05/04/20  Educator [facility]  Instruction Review Code 1- Verbalizes Understanding      Falls Prevention: - Provides verbal and written material to individual with discussion of falls prevention and safety. Flowsheet Row Cardiac Rehab from 07/28/2020 in [facility] Cardiac and Pulmonary Rehab  Date 05/04/20  Educator [facility]  Instruction Review  Code 1- Verbalizes Understanding      Other: -Provides group and verbal instruction on various topics (see comments)   Knowledge Questionnaire Score:  Knowledge Questionnaire Score - 05/11/20 1458      Knowledge Questionnaire Score   Pre Score 24/26 Education Focus: PAD, Exercise           Core Components/Risk Factors/Patient Goals at Admission:  Personal Goals and Risk Factors at Admission - 05/11/20 1458      Core Components/Risk Factors/Patient Goals on Admission    Weight Management Yes;Weight Loss;Obesity    Intervention Weight Management: Develop a combined nutrition and exercise program designed to reach desired caloric intake, while maintaining  appropriate intake of nutrient and fiber, sodium and fats, and appropriate energy expenditure required for the weight goal.;Weight Management: Provide education and appropriate resources to help participant work on and attain dietary goals.;Weight Management/Obesity: Establish reasonable short term and long term weight goals.;Obesity: Provide education and appropriate resources to help participant work on and attain dietary goals.    Admit Weight 205 lb 3.2 oz (93.1 kg)    Goal Weight: Short Term 200 lb (90.7 kg)    Goal Weight: Long Term 195 lb (88.5 kg)    Expected Outcomes Short Term: Continue to assess and modify interventions until short term weight is achieved;Long Term: Adherence to nutrition and physical activity/exercise program aimed toward attainment of established weight goal;Weight Loss: Understanding of general recommendations for a balanced deficit meal plan, which promotes 1-2 lb weight loss per week and includes a negative energy balance of [phone] kcal/d;Understanding recommendations for meals to include 15-35% energy as protein, 25-35% energy from fat, 35-60% energy from carbohydrates, less than $RemoveB'200mg'VPmHdymH$  of dietary cholesterol, 20-35 gm of total fiber daily;Understanding of distribution of calorie intake throughout the day  with the consumption of 4-5 meals/snacks    Diabetes Yes    Intervention Provide education about signs/symptoms and action to take for hypo/hyperglycemia.;Provide education about proper nutrition, including hydration, and aerobic/resistive exercise prescription along with prescribed medications to achieve blood glucose in normal ranges: Fasting glucose 65-99 mg/dL    Expected Outcomes Short Term: Participant verbalizes understanding of the signs/symptoms and immediate care of hyper/hypoglycemia, proper foot care and importance of medication, aerobic/resistive exercise and nutrition plan for blood glucose control.;Long Term: Attainment of HbA1C < 7%.    Hypertension Yes    Intervention Provide education on lifestyle modifcations including regular physical activity/exercise, weight management, moderate sodium restriction and increased consumption of fresh fruit, vegetables, and low fat dairy, alcohol moderation, and smoking cessation.;Monitor prescription use compliance.    Expected Outcomes Short Term: Continued assessment and intervention until BP is < 140/49mm HG in hypertensive participants. < 130/14mm HG in hypertensive participants with diabetes, heart failure or chronic kidney disease.;Long Term: Maintenance of blood pressure at goal levels.    Lipids Yes    Intervention Provide education and support for participant on nutrition & aerobic/resistive exercise along with prescribed medications to achieve LDL '70mg'$ , HDL >$Remo'40mg'qNOdh$ .    Expected Outcomes Short Term: Participant states understanding of desired cholesterol values and is compliant with medications prescribed. Participant is following exercise prescription and nutrition guidelines.;Long Term: Cholesterol controlled with medications as prescribed, with individualized exercise RX and with personalized nutrition plan. Value goals: LDL < $Rem'70mg'tADT$ , HDL > 40 mg.           Education:Diabetes - Individual verbal and written instruction to review  signs/symptoms of diabetes, desired ranges of glucose level fasting, after meals and with exercise. Acknowledge that pre and post exercise glucose checks will be done for 3 sessions at entry of program. [place] from 07/28/2020 in [facility] Cardiac and Pulmonary Rehab  Date 05/04/20  Educator [facility]  Instruction Review Code 1- Verbalizes Understanding      Core Components/Risk Factors/Patient Goals Review:   Goals and Risk Factor Review    Row Name 05/24/20 0739 07/26/20 0813           Core Components/Risk Factors/Patient Goals Review   Personal Goals Review Weight Management/Obesity;Hypertension;Lipids;Diabetes Weight Management/Obesity;Hypertension;Lipids;Diabetes      Review Patient reports taking all medications as prescribed. He monitors blood sugars at home and reports numbers in acceptable ranges. His  weight has increased slightly and he expressed interest in intermittent fasting. He was encouraged to be careful with his blood sugar levels and monitor them closely if he chooses to do this. He was not yet met with program dietician but will be scheduled for that in his upcoming visitis. Bob's weight has greatly increased over the last month, almost 14 lbs.  He is eating more and not following hunger cues then feels distended and overly full after eating two meals close together.  We talked about using gum and/or mints to help as well.  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  His sugars are doing okay as far as he feels.  His blood pressures have been good in class but he does not check them at home frequently.  He has been complaining of frequent headaches since COVID, but not check BP.  He was encouraged to check his BP and to contact his doctor about headaches and weight gain.  He does not think weight is fluid as he has been eating.  He does not seem to be getting enough water either as he never has been good about drinking enough.      Expected Outcomes Short: continue  to attend rehab consistently and take all medications. Long: control risk factors with heart healthy lifestyle choices. Short: Call doctor about headaches and weight gain Long: Continue to monitor his risk factors.             Core Components/Risk Factors/Patient Goals at Discharge (Final Review):   Goals and Risk Factor Review - 07/26/20 0813      Core Components/Risk Factors/Patient Goals Review   Personal Goals Review Weight Management/Obesity;Hypertension;Lipids;Diabetes    Review Bob's weight has greatly increased over the last month, almost 14 lbs.  He is eating more and not following hunger cues then feels distended and overly full after eating two meals close together.  We talked about using gum and/or mints to help as well.  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  His sugars are doing okay as far as he feels.  His blood pressures have been good in class but he does not check them at home frequently.  He has been complaining of frequent headaches since COVID, but not check BP.  He was encouraged to check his BP and to contact his doctor about headaches and weight gain.  He does not think weight is fluid as he has been eating.  He does not seem to be getting enough water either as he never has been good about drinking enough.    Expected Outcomes Short: Call doctor about headaches and weight gain Long: Continue to monitor his risk factors.           ITP Comments:  ITP Comments    Row Name 05/04/20 0854 05/11/20 1154 05/12/20 0653 05/12/20 0739 06/09/20 0959   ITP Comments Virtual Visit completed. Patient informed on EP and RD appointment and 6 Minute walk test. Patient also informed of patient health questionnaires on My Chart. Patient Verbalizes understanding. Visit diagnosis can be found in [facility] 04/21/2020. Completed 6MWT and gym orientation. Initial ITP created and sent for review to Dr. Emily Filbert, Medical Director. 30 Day review completed. Medical Director ITP review done,  changes made as directed, and signed approval by Medical Director.  New to program First full day of exercise!  Patient was oriented to gym and equipment including functions, settings, policies, and procedures.  Patient's individual exercise  prescription and treatment plan were reviewed.  All starting workloads were established based on the results of the 6 minute walk test done at initial orientation visit.  The plan for exercise progression was also introduced and progression will be customized based on patient's performance and goals. 30 Day review completed. Medical Director ITP review done, changes made as directed, and signed approval by Medical Director.   Row Name 06/30/20 [id number] 07/07/20 5916 07/07/20 0752 07/21/20 0901 07/23/20 0900   ITP Comments Patient called out today.  He had been out previously as he was sick.  Now he is out of town taking care of his brother who is coming home from the hospital in [place].  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 30 Day review completed. Medical Director ITP review done, changes made as directed, and signed approval by Medical Director. [name] has not attended since last review due to illness and taking care of family member. Out due to COVID return on 5/6 [name] returned today after being out with COVID.  He was encouraged to builld back slowly.  He noted increased fatigue today.  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   [place] Name 07/28/20 1058 08/04/20 0739         ITP Comments Pt able to follow exercise prescription today. Patient complained of weight gain over last three months. Also experienced short period of chest discomfort of 2/10 pain. Discomfort passed and was able to continue exercising and attend education without incident. Patient's rhythm recordings and record of weights since beginning rehab were sent to Dr. Laurelyn Sickle office requesting a response from the doctor. Will continue to  monitor for progression. 30 Day review completed. Medical Director ITP review done, changes made as directed, and signed approval by Medical Director.             Comments:

## 2020-08-04 NOTE — Progress Notes (Signed)
Daily Session Note  Patient Details  Name: Jake Castro MRN: [id number] Date of Birth: 07-25-1950 Referring Provider:   Flowsheet Row Cardiac Rehab from 05/11/2020 in [facility] Cardiac and Pulmonary Rehab  Referring Provider [name] MD      Encounter Date: 08/04/2020  Check In:  Session Check In - 08/04/20 0952      Check-In   Supervising physician immediately available to respond to emergencies See telemetry face sheet for immediately available ER MD    Location [facility]-Cardiac & Pulmonary Rehab    Staff Present Birdie Sons, MPA, Elveria Rising, BA, ACSM CEP, Exercise Physiologist;Joseph Tessie Fass RCP,RRT,BSRT    Virtual Visit No    Medication changes reported     No    Fall or balance concerns reported    No    Warm-up and Cool-down Performed on first and last piece of equipment    Resistance Training Performed Yes    VAD Patient? No    PAD/SET Patient? No      Pain Assessment   Currently in Pain? No/denies              Social History   Tobacco Use  Smoking Status Former Smoker  . Packs/day: 0.50  . Years: 1.00  . Pack years: 0.50  . Types: Cigarettes  . Quit date: 05/04/1965  . Years since quitting: 55.2  Smokeless Tobacco Never Used  Tobacco Comment   smoked for a few months in his early 20's.    Goals Met:  Independence with exercise equipment Exercise tolerated well No report of cardiac concerns or symptoms Strength training completed today  Goals Unmet:  Not Applicable  Comments: Pt able to follow exercise prescription today without complaint.  Will continue to monitor for progression.    Dr. Emily Filbert is Medical Director for [facility].

## 2020-08-06 ENCOUNTER — Encounter: Payer: Medicare PPO | Admitting: *Deleted

## 2020-08-06 ENCOUNTER — Other Ambulatory Visit: Payer: Self-pay

## 2020-08-06 DIAGNOSIS — I214 Non-ST elevation (NSTEMI) myocardial infarction: Secondary | ICD-10-CM | POA: Diagnosis not present

## 2020-08-06 NOTE — Progress Notes (Signed)
Daily Session Note  Patient Details  Name: Jake Castro MRN: [id number] Date of Birth: 11/24/1950 Referring Provider:   Flowsheet Row Cardiac Rehab from 05/11/2020 in [facility] Re[facility]  Referring Provider Khan, Shaukat MD      Encounter Date: 08/06/2020  Check In:  Session Check In - 08/06/20 0958      Check-In   Supervising physician immediately available to respond to emergencies See telemetry face sheet for immediately available ER MD    Location [facility]    Staff Present Meredith Craven, RN BSN;Joseph Hood RCP,RRT,BSRT;Jessica Hawkins, MA, RCEP, CCRP, CCET    Virtual Visit No    Medication changes reported     No    Fall or balance concerns reported    No    Warm-up and Cool-down Performed on first and last piece of equipment    Resistance Training Performed Yes    VAD Patient? No    PAD/SET Patient? No      Pain Assessment   Currently in Pain? No/denies              Social History   Tobacco Use  Smoking Status Former Smoker  . Packs/day: 0.50  . Years: 1.00  . Pack years: 0.50  . Types: Cigarettes  . Quit date: 05/04/1965  . Years since quitting: 55.2  Smokeless Tobacco Never Used  Tobacco Comment   smoked for a few months in his early 20's.    Goals Met:  Independence with exercise equipment Exercise tolerated well No report of cardiac concerns or symptoms Strength training completed today  Goals Unmet:  Not Applicable  Comments: Pt able to follow exercise prescription today without complaint.  Will continue to monitor for progression.    Dr. Mark Miller is Medical Director for [facility] and [facility]. 

## 2020-08-09 ENCOUNTER — Encounter: Payer: Medicare PPO | Admitting: *Deleted

## 2020-08-09 ENCOUNTER — Other Ambulatory Visit: Payer: Self-pay

## 2020-08-09 DIAGNOSIS — I214 Non-ST elevation (NSTEMI) myocardial infarction: Secondary | ICD-10-CM | POA: Diagnosis not present

## 2020-08-09 NOTE — Progress Notes (Signed)
Daily Session Note  Patient Details  Name: ZAYVEN POWE MRN: [id number] Date of Birth: 09-02-1950 Referring Provider:   Flowsheet Row Cardiac Rehab from 05/11/2020 in [facility] Cardiac and Pulmonary Rehab  Referring Provider [name] MD      Encounter Date: 08/09/2020  Check In:      Social History   Tobacco Use  Smoking Status Former Smoker  . Packs/day: 0.50  . Years: 1.00  . Pack years: 0.50  . Types: Cigarettes  . Quit date: 05/04/1965  . Years since quitting: 55.3  Smokeless Tobacco Never Used  Tobacco Comment   smoked for a few months in his early 20's.    Goals Met:  Independence with exercise equipment Exercise tolerated well No report of cardiac concerns or symptoms  Goals Unmet:  Not Applicable  Comments: Pt able to follow exercise prescription today without complaint.  Will continue to monitor for progression.    Dr. Emily Filbert is Medical Director for [place].  Dr. Ottie Glazier is Medical Director for [facility].

## 2020-08-10 DIAGNOSIS — I214 Non-ST elevation (NSTEMI) myocardial infarction: Secondary | ICD-10-CM

## 2020-08-10 NOTE — Progress Notes (Signed)
Unable to complete initial consultation for nutrition as patient was absent for various reasons vacations and family emergency as well as COVID, patient has also missed multiple nutrition appointments - will continue to attempt to complete.

## 2020-08-13 ENCOUNTER — Encounter: Payer: Medicare PPO | Admitting: *Deleted

## 2020-08-13 ENCOUNTER — Other Ambulatory Visit: Payer: Self-pay

## 2020-08-13 DIAGNOSIS — I214 Non-ST elevation (NSTEMI) myocardial infarction: Secondary | ICD-10-CM | POA: Diagnosis not present

## 2020-08-13 NOTE — Progress Notes (Signed)
Daily Session Note  Patient Details  Name: ENDRE COUTTS MRN: [id number] Date of Birth: December 11, 1950 Referring Provider:   Flowsheet Row Cardiac Rehab from 05/11/2020 in [facility] Cardiac and Pulmonary Rehab  Referring Provider [name] MD      Encounter Date: 08/13/2020  Check In:  Session Check In - 08/13/20 1003      Check-In   Supervising physician immediately available to respond to emergencies See telemetry face sheet for immediately available ER MD    Location [facility]    Staff Present Renita Papa, RN BSN;Jessica [place], MA, RCEP, CCRP, Marylynn Pearson, MS, ASCM CEP, Exercise Physiologist    Virtual Visit No    Medication changes reported     No    Fall or balance concerns reported    No    Warm-up and Cool-down Performed on first and last piece of equipment    Resistance Training Performed Yes    VAD Patient? No    PAD/SET Patient? No      Pain Assessment   Currently in Pain? No/denies              Social History   Tobacco Use  Smoking Status Former Smoker  . Packs/day: 0.50  . Years: 1.00  . Pack years: 0.50  . Types: Cigarettes  . Quit date: 05/04/1965  . Years since quitting: 55.3  Smokeless Tobacco Never Used  Tobacco Comment   smoked for a few months in his early 20's.    Goals Met:  Independence with exercise equipment Exercise tolerated well No report of cardiac concerns or symptoms Strength training completed today  Goals Unmet:  Not Applicable  Comments: Pt able to follow exercise prescription today without complaint.  Will continue to monitor for progression.    Dr. Emily Filbert is Medical Director for [place].  Dr. Ottie Glazier is Medical Director for [facility] Rehabilitation.

## 2020-08-18 ENCOUNTER — Other Ambulatory Visit: Payer: Self-pay

## 2020-08-18 ENCOUNTER — Encounter: Payer: Medicare PPO | Attending: Cardiovascular Disease | Admitting: *Deleted

## 2020-08-18 DIAGNOSIS — I214 Non-ST elevation (NSTEMI) myocardial infarction: Secondary | ICD-10-CM | POA: Insufficient documentation

## 2020-08-18 NOTE — Progress Notes (Signed)
Daily Session Note  Patient Details  Name: Jake Castro MRN: [id number] Date of Birth: October 18, 1950 Referring Provider:   Flowsheet Row Cardiac Rehab from 05/11/2020 in [facility] Cardiac and Pulmonary Rehab  Referring Provider [name] MD      Encounter Date: 08/18/2020  Check In:  Session Check In - 08/18/20 1104      Check-In   Supervising physician immediately available to respond to emergencies See telemetry face sheet for immediately available ER MD    Location [facility]    Staff Present Renita Papa, RN BSN;Jessica [place], MA, RCEP, CCRP, CCET;Amanda Sommer, BA, ACSM CEP, Exercise Physiologist;Melissa Caiola RDN, LDN    Virtual Visit No    Medication changes reported     No    Fall or balance concerns reported    No    Warm-up and Cool-down Performed on first and last piece of equipment    Resistance Training Performed Yes    VAD Patient? No    PAD/SET Patient? No      Pain Assessment   Currently in Pain? No/denies              Social History   Tobacco Use  Smoking Status Former Smoker  . Packs/day: 0.50  . Years: 1.00  . Pack years: 0.50  . Types: Cigarettes  . Quit date: 05/04/1965  . Years since quitting: 55.3  Smokeless Tobacco Never Used  Tobacco Comment   smoked for a few months in his early 20's.    Goals Met:  Independence with exercise equipment Exercise tolerated well No report of cardiac concerns or symptoms Strength training completed today  Goals Unmet:  Not Applicable  Comments: Pt able to follow exercise prescription today without complaint.  Will continue to monitor for progression.    Dr. Emily Filbert is Medical Director for [place].  Dr. Ottie Glazier is Medical Director for [facility] Pulmonary Rehabilitation.

## 2020-08-20 ENCOUNTER — Other Ambulatory Visit: Payer: Self-pay

## 2020-08-20 ENCOUNTER — Encounter: Payer: Medicare PPO | Admitting: *Deleted

## 2020-08-20 DIAGNOSIS — I214 Non-ST elevation (NSTEMI) myocardial infarction: Secondary | ICD-10-CM

## 2020-08-20 NOTE — Progress Notes (Signed)
Daily Session Note  Patient Details  Name: Jake Castro MRN: [id number] Date of Birth: 02/06/1951 Referring Provider:   Flowsheet Row Cardiac Rehab from 05/11/2020 in [facility] Cardiac and Pulmonary Rehab  Referring Provider Jake Laming MD      Encounter Date: 08/20/2020  Check In:  Session Check In - 08/20/20 1014      Check-In   Supervising physician immediately available to respond to emergencies See telemetry face sheet for immediately available ER MD    Location [facility]    Staff Present Jake Lark, RN, BSN, CCRP;Jake Willard, MA, RCEP, CCRP, CCET;Jake Castro RCP,RRT,BSRT    Virtual Visit No    Medication changes reported     No    Fall or balance concerns reported    No    Warm-up and Cool-down Performed on first and last piece of equipment    Resistance Training Performed Yes    VAD Patient? No    PAD/SET Patient? No      Pain Assessment   Currently in Pain? No/denies              Social History   Tobacco Use  Smoking Status Former Smoker  . Packs/day: 0.50  . Years: 1.00  . Pack years: 0.50  . Types: Cigarettes  . Quit date: 05/04/1965  . Years since quitting: 55.3  Smokeless Tobacco Never Used  Tobacco Comment   smoked for a few months in his early 20's.    Goals Met:  Independence with exercise equipment Exercise tolerated well No report of cardiac concerns or symptoms  Goals Unmet:  Not Applicable  Comments: Pt able to follow exercise prescription today without complaint.  Will continue to monitor for progression.    Dr. Emily Castro is Medical Director for [name].  Dr. Ottie Castro is Medical Director for [facility] Pulmonary Rehabilitation.

## 2020-08-23 ENCOUNTER — Encounter: Payer: Medicare PPO | Admitting: *Deleted

## 2020-08-23 ENCOUNTER — Other Ambulatory Visit: Payer: Self-pay

## 2020-08-23 DIAGNOSIS — I214 Non-ST elevation (NSTEMI) myocardial infarction: Secondary | ICD-10-CM

## 2020-08-23 NOTE — Progress Notes (Signed)
Daily Session Note  Patient Details  Name: Jake Castro MRN: [id number] Date of Birth: [birth year] Referring Provider:   Flowsheet Row Cardiac Rehab from 05/11/2020 in [facility] Cardiac and Pulmonary Rehab  Referring Provider [name] MD      Encounter Date: 08/23/2020  Check In:  Session Check In - 08/23/20 1000      Check-In   Supervising physician immediately available to respond to emergencies See telemetry face sheet for immediately available ER MD    Location [facility]    Staff Present Heath Lark, RN, BSN, CCRP;Joseph Hood RCP,RRT,BSRT;Kelly [place], [place], ACSM CEP, Exercise Physiologist    Virtual Visit No    Medication changes reported     No    Fall or balance concerns reported    No    Warm-up and Cool-down Performed on first and last piece of equipment    Resistance Training Performed Yes    VAD Patient? No    PAD/SET Patient? No      Pain Assessment   Currently in Pain? No/denies              Social History   Tobacco Use  Smoking Status Former Smoker  . Packs/day: 0.50  . Years: 1.00  . Pack years: 0.50  . Types: Cigarettes  . Quit date: 05/04/1965  . Years since quitting: 55.3  Smokeless Tobacco Never Used  Tobacco Comment   smoked for a few months in his early 20's.    Goals Met:  Independence with exercise equipment Exercise tolerated well Personal goals reviewed  Goals Unmet:  Not Applicable  Comments: Pt able to follow exercise prescription today without complaint.  Will continue to monitor for progression.    Dr. Emily Filbert is Medical Director for [place].  Dr. Ottie Glazier is Medical Director for [facility] Pulmonary Rehabilitation.

## 2020-08-23 NOTE — Progress Notes (Signed)
Completed initial nutrition consultation.   

## 2020-08-27 ENCOUNTER — Other Ambulatory Visit: Payer: Self-pay

## 2020-08-27 DIAGNOSIS — I214 Non-ST elevation (NSTEMI) myocardial infarction: Secondary | ICD-10-CM

## 2020-08-27 NOTE — Progress Notes (Signed)
Daily Session Note  Patient Details  Name: DASHAWN BARTNICK MRN: [id number] Date of Birth: 08/19/1950 Referring Provider:   Flowsheet Row Cardiac Rehab from 05/11/2020 in [facility] Cardiac and Pulmonary Rehab  Referring Provider [name] MD       Encounter Date: 08/27/2020  Check In:  Session Check In - 08/27/20 0952       Check-In   Supervising physician immediately available to respond to emergencies See telemetry face sheet for immediately available ER MD    Location [facility]    Staff Present Justin Mend RCP,RRT,BSRT;Vida Rigger RN, BSN;Jessica Luan [place], MA, RCEP, CCRP, CCET    Virtual Visit No    Medication changes reported     No    Fall or balance concerns reported    No    Warm-up and Cool-down Performed on first and last piece of equipment    Resistance Training Performed Yes    VAD Patient? No    PAD/SET Patient? No      Pain Assessment   Currently in Pain? No/denies                Social History   Tobacco Use  Smoking Status Former   Packs/day: 0.50   Years: 1.00   Pack years: 0.50   Types: Cigarettes   Quit date: 05/04/1965   Years since quitting: 55.3  Smokeless Tobacco Never  Tobacco Comments   smoked for a few months in his early 20's.    Goals Met:  Independence with exercise equipment Exercise tolerated well No report of cardiac concerns or symptoms Strength training completed today  Goals Unmet:  Not Applicable  Comments: Pt able to follow exercise prescription today without complaint.  Will continue to monitor for progression.   Dr. Emily Filbert is Medical Director for [place].  Dr. Ottie Glazier is Medical Director for [facility] Pulmonary Rehabilitation.

## 2020-09-01 ENCOUNTER — Encounter: Payer: Self-pay | Admitting: *Deleted

## 2020-09-01 ENCOUNTER — Other Ambulatory Visit: Payer: Self-pay

## 2020-09-01 DIAGNOSIS — I214 Non-ST elevation (NSTEMI) myocardial infarction: Secondary | ICD-10-CM

## 2020-09-01 NOTE — Progress Notes (Signed)
Daily Session Note  Patient Details  Name: Jake Castro MRN: [id number] Date of Birth: Jun 20, 1950 Referring Provider:   Flowsheet Row Cardiac Rehab from 05/11/2020 in [facility] Cardiac and Pulmonary Rehab  Referring Provider [name] MD       Encounter Date: 09/01/2020  Check In:  Session Check In - 09/01/20 0951       Check-In   Supervising physician immediately available to respond to emergencies See telemetry face sheet for immediately available ER MD    Location [facility]    Staff Present Birdie Sons, MPA, RN;Melissa Caiola RDN, LDN;Joseph Tessie Fass RCP,RRT,BSRT    Virtual Visit No    Medication changes reported     No    Fall or balance concerns reported    No    Warm-up and Cool-down Performed on first and last piece of equipment    Resistance Training Performed Yes    VAD Patient? No    PAD/SET Patient? No      Pain Assessment   Currently in Pain? No/denies                Social History   Tobacco Use  Smoking Status Former   Packs/day: 0.50   Years: 1.00   Pack years: 0.50   Types: Cigarettes   Quit date: 05/04/1965   Years since quitting: 55.3  Smokeless Tobacco Never  Tobacco Comments   smoked for a few months in his early 20's.    Goals Met:  Independence with exercise equipment Exercise tolerated well No report of cardiac concerns or symptoms Strength training completed today  Goals Unmet:  Not Applicable  Comments: Pt able to follow exercise prescription today without complaint.  Will continue to monitor for progression.    Dr. Emily Filbert is Medical Director for [facility].  Dr. Ottie Glazier is Medical Director for [facility] Pulmonary Rehabilitation.

## 2020-09-01 NOTE — Progress Notes (Signed)
Cardiac Individual Treatment Plan  Patient Details  Name: Jake Castro MRN: [id number] Date of Birth: [birth year] Referring Provider:   Flowsheet Row Cardiac Rehab from 05/11/2020 in [facility] Cardiac and Pulmonary Rehab  Referring Provider [name] MD       Initial Encounter Date:  Flowsheet Row Cardiac Rehab from 05/11/2020 in [facility] Cardiac and Pulmonary Rehab  Date 05/11/20       Visit Diagnosis: NSTEMI (non-ST elevated myocardial infarction) [facility])  Patient's Home Medications on Admission:  Current Outpatient Medications:    acetaminophen (TYLENOL) 325 MG tablet, Take 2 tablets (650 mg total) by mouth every 6 (six) hours as needed for mild pain (or Fever >/= 101). (Patient not taking: Reported on 05/04/2020), Disp: , Rfl:    Ascorbic Acid (VITAMIN C) 1000 MG tablet, Take 1,000 mg by mouth daily. (Patient not taking: No sig reported), Disp: , Rfl:    aspirin 325 MG tablet, Take by mouth., Disp: , Rfl:    aspirin 81 MG tablet, Take 81 mg by mouth daily. Am (Patient not taking: No sig reported), Disp: , Rfl:    Blood Glucose Monitoring Suppl (ONETOUCH VERIO FLEX SYSTEM) w/Device KIT, U UTD TO TEST BLOOD SUGAR, Disp: , Rfl:    carvedilol (COREG) 12.5 MG tablet, Take 12.5 mg by mouth 2 (two) times daily with a meal., Disp: , Rfl:    chlorhexidine (PERIDEX) 0.12 % solution, chlorhexidine gluconate 0.12 % mouthwash  RINSE AND SPIT WITH 1/2 OZ FOR 30 SECONDS BID FOR 2 WEEKS, Disp: , Rfl:    Cholecalciferol (VITAMIN D-3) 5000 UNITS TABS, Take 5,000 mg by mouth daily., Disp: , Rfl:    Exenatide ER 2 MG SRER, Inject into the skin once a week. (Patient not taking: Reported on 05/04/2020), Disp: , Rfl:    fenofibrate (TRICOR) 145 MG tablet, Take 145 mg by mouth daily., Disp: , Rfl:    ferrous sulfate 325 (65 FE) MG tablet, Take by mouth. (Patient not taking: Reported on 05/04/2020), Disp: , Rfl:    glucose blood (ONETOUCH ULTRA) test strip, U UTD TO TEST BLOOD SUGAR (Patient not taking: Reported  on 05/04/2020), Disp: , Rfl:    glucose blood (PRECISION QID TEST) test strip, U UTD TO TEST BLOOD SUGAR (Patient not taking: Reported on 05/04/2020), Disp: , Rfl:    isosorbide mononitrate (IMDUR) 30 MG 24 hr tablet, Take 1 tablet (30 mg total) by mouth daily., Disp: 30 tablet, Rfl: 0   KRILL OIL PO, Take 1,000 mg by mouth daily.  (Patient not taking: No sig reported), Disp: , Rfl:    l-methylfolate-B6-B12 (METANX) 3-35-2 MG TABS tablet, Take 1 tablet by mouth daily. am (Patient not taking: No sig reported), Disp: , Rfl:    lisinopril (PRINIVIL,ZESTRIL) 40 MG tablet, Take 40 mg by mouth daily. am, Disp: , Rfl:    metFORMIN (GLUCOPHAGE) 1000 MG tablet, Take 1,000 mg by mouth 2 (two) times daily with a meal. Am and bedtime, Disp: , Rfl:    Methylcobalamin 1 MG CHEW, Chew by mouth. (Patient not taking: No sig reported), Disp: , Rfl:    Multiple Vitamin (MULTIVITAMIN) capsule, Take 1 capsule by mouth daily. (Patient not taking: No sig reported), Disp: , Rfl:    Omega-3 Fatty Acids (FISH OIL) 1000 MG CAPS, omega-3 acid ethyl esters 1 gram capsule, Disp: , Rfl:    rosuvastatin (CRESTOR) 40 MG tablet, Take 1 tablet (40 mg total) by mouth daily., Disp: 30 tablet, Rfl: 0   Semaglutide,0.25 or 0.5MG /DOS, (OZEMPIC, 0.25  OR 0.5 MG/DOSE,) 2 MG/1.5ML SOPN, Ozempic 0.25 mg or 0.5 mg (2 mg/1.5 mL) subcutaneous pen injector, Disp: , Rfl:    Specialty Vitamins Products (PROSTATE PO), Take by mouth daily. B6 9m and zinc 38m(Patient not taking: No sig reported), Disp: , Rfl:    ticagrelor (BRILINTA) 90 MG TABS tablet, Take 1 tablet (90 mg total) by mouth 2 (two) times daily., Disp: 60 tablet, Rfl: 0   triamcinolone cream (KENALOG) 0.5 %, triamcinolone acetonide 0.5 % topical cream, Disp: , Rfl:    Ubiquinol 200 MG CAPS, Take by mouth daily. (Patient not taking: No sig reported), Disp: , Rfl:    VITAMIN K PO, Take 1 tablet by mouth daily., Disp: , Rfl:  No current facility-administered medications for this  visit.  Facility-Administered Medications Ordered in Other Visits:    sodium chloride flush (NS) 0.9 % injection 3 mL, 3 mL, Intravenous, Q12H, KhDionisio DavidMD  Past Medical History: Past Medical History:  Diagnosis Date   CAD (coronary artery disease)    a. 12/2006 NSTEMI/PCI: RCA 9578m>mid vessel stented, unable to cross dist dzs w/ balloon;  b. 11/2013 NL MV;  c. 11/2013 Echo: EF 55%;  d. 12/2013 Cath: LM 95 ost, LAD 70p, 2m88mX 30p/m, OM1 60, RCA 20p, 15m/78m 95d.   Carotid arterial disease (HCC)    a. s/p LCEA   Diabetes mellitus (HCC) [place]   HTN (hypertension)    Hyperlipidemia    Hypertension    Morbid obesity (HCC) [place]   Neuropathy    LEFT FOOT   Plantar fasciitis    Pneumonia    Wears dentures    full upper and lower partial    Tobacco Use: Social History   Tobacco Use  Smoking Status Former   Packs/day: 0.50   Years: 1.00   Pack years: 0.50   Types: Cigarettes   Quit date: 05/04/1965   Years since quitting: 55.3  Smokeless Tobacco Never  Tobacco Comments   smoked for a few months in his early 20's.    Labs: Recent Review Flowsheet Data     Labs for ITP Cardiac and Pulmonary Rehab Latest Ref Rng & Units 01/03/2014 01/03/2014 01/04/2014 04/21/2020 04/22/2020   Cholestrol 0 - 200 mg/dL - - - - 121   LDLCALC 0 - 99 mg/dL - - - - 46   HDL >40 mg/dL - - - - 46   Trlycerides <150 mg/dL - - - - 146   Hemoglobin A1c 4.8 - 5.6 % - - - 6.7(H) -   PHART 7.350 - 7.450 7.344(L) - - - -   PCO2ART 35.0 - 45.0 mmHg 52.4(H) - - - -   HCO3 20.0 - 28.0 mmol/L 28.5(H) - - 29.2(H) -   TCO2 0 - 100 mmol/L _0 - -   ACIDBASEDEF 0.0 - 2.0 mmol/L - - - - -   O2SAT % 95.0 - - 60.8 -        Exercise Target Goals: Exercise Program Goal: Individual exercise prescription set using results from initial 6 min walk test and THRR while considering  patient's activity barriers and  safety.   Exercise Prescription Goal: Initial exercise prescription builds to 30-45 minutes a day of aerobic activity, 2-3 days per week.  Home exercise guidelines will be given to patient during program as part of exercise prescription that the participant  will acknowledge.   Education: Aerobic Exercise: - Group verbal and visual presentation on the components of exercise prescription. Introduces F.I.T.T principle from ACSM for exercise prescriptions.  Reviews F.I.T.T. principles of aerobic exercise including progression. Written material given at graduation. Flowsheet Row Cardiac Rehab from 08/04/2020 in [facility] Cardiac and Pulmonary Rehab  Education need identified 05/11/20       Education: Resistance Exercise: - Group verbal and visual presentation on the components of exercise prescription. Introduces F.I.T.T principle from ACSM for exercise prescriptions  Reviews F.I.T.T. principles of resistance exercise including progression. Written material given at graduation. Flowsheet Row Cardiac Rehab from 08/04/2020 in [facility] Cardiac and Pulmonary Rehab  Date 05/12/20  Educator [facility]  Instruction Review Code 1- Verbalizes Understanding        Education: Exercise & Equipment Safety: - Individual verbal instruction and demonstration of equipment use and safety with use of the equipment.   Education: Exercise Physiology & General Exercise Guidelines: - Group verbal and written instruction with models to review the exercise physiology of the cardiovascular system and associated critical values. Provides general exercise guidelines with specific guidelines to those with heart or lung disease.    Education: Flexibility, Balance, Mind/Body Relaxation: - Group verbal and visual presentation with interactive activity on the components of exercise prescription. Introduces F.I.T.T principle from ACSM for exercise prescriptions. Reviews F.I.T.T. principles of flexibility and balance exercise training  including progression. Also discusses the mind body connection.  Reviews various relaxation techniques to help reduce and manage stress (i.e. Deep breathing, progressive muscle relaxation, and visualization). Balance handout provided to take home. Written material given at graduation. Flowsheet Row Cardiac Rehab from 08/04/2020 in [facility] Cardiac and Pulmonary Rehab  Date 05/19/20  Educator AS  Instruction Review Code 1- Verbalizes Understanding       Activity Barriers & Risk Stratification:  Activity Barriers & Cardiac Risk Stratification - 05/11/20 1158       Activity Barriers & Cardiac Risk Stratification   Activity Barriers Deconditioning;Muscular Weakness;Balance Concerns    Cardiac Risk Stratification High             6 Minute Walk:  6 Minute Walk     Row Name 05/11/20 1156         6 Minute Walk   Phase Initial     Distance 1435 feet     Walk Time 6 minutes     # of Rest Breaks 0     MPH 2.72     METS 3.22     RPE 15     Perceived Dyspnea  1     VO2 Peak 11.27     Symptoms Yes (comment)     Comments R leg calf pain and L thigh pain 4/10     Resting HR 76 bpm     Resting BP 132/70     Resting Oxygen Saturation  98 %     Exercise Oxygen Saturation  during 6 min walk 98 %     Max Ex. HR 106 bpm     Max Ex. BP 142/74     2 Minute Post BP 124/62              Oxygen Initial Assessment:   Oxygen Re-Evaluation:   Oxygen Discharge (Final Oxygen Re-Evaluation):   Initial Exercise Prescription:  Initial Exercise Prescription - 05/11/20 1100       Date of Initial Exercise RX and Referring Provider   Date 05/11/20    Referring Provider [name] MD  Treadmill   MPH 2.5    Grade 0.5    Minutes 15    METs 3.09      Recumbant Bike   Level 3    RPM 50    Watts 36    Minutes 15    METs 3      NuStep   Level 3    SPM 80    Minutes 15    METs 3      T5 Nustep   Level 3    SPM 80    Minutes 15    METs 3      Prescription Details    Frequency (times per week) 3    Duration Progress to 30 minutes of continuous aerobic without signs/symptoms of physical distress      Intensity   THRR 40-80% of Max Heartrate 105-134    Ratings of Perceived Exertion 11-13    Perceived Dyspnea 0-4      Progression   Progression Continue to progress workloads to maintain intensity without signs/symptoms of physical distress.      Resistance Training   Training Prescription Yes    Weight 4 lb    Reps 10-15             Perform Capillary Blood Glucose checks as needed.  Exercise Prescription Changes:   Exercise Prescription Changes     Row Name 05/11/20 1100 05/24/20 1500 06/09/20 1400 06/22/20 1500 08/02/20 0900     Response to Exercise   Blood Pressure (Admit) 132/70 134/70 118/60 126/70 122/58   Blood Pressure (Exercise) 142/74 142/76 148/60 174/68 138/60   Blood Pressure (Exit) 124/60 124/62 96/52 112/62 112/56   Heart Rate (Admit) 76 bpm 63 bpm 78 bpm 73 bpm 73 bpm   Heart Rate (Exercise) 106 bpm 116 bpm 129 bpm 133 bpm 112 bpm   Heart Rate (Exit) 75 bpm 83 bpm 83 bpm 108 bpm 85 bpm   Oxygen Saturation (Admit) 98 % -- -- -- --   Oxygen Saturation (Exercise) 98 % -- -- -- --   Rating of Perceived Exertion (Exercise) _0 Perceived Dyspnea (Exercise) 1 -- -- -- --   Symptoms R calf pain, L thigh pain 4/10 none none none none   Comments walk test results -- -- -- --   Duration -- Continue with 30 min of aerobic exercise without signs/symptoms of physical distress. Continue with 30 min of aerobic exercise without signs/symptoms of physical distress. Continue with 30 min of aerobic exercise without signs/symptoms of physical distress. Continue with 30 min of aerobic exercise without signs/symptoms of physical distress.   Intensity -- THRR unchanged THRR unchanged THRR unchanged THRR unchanged     Progression   Progression -- Continue to progress workloads to maintain intensity without signs/symptoms of  physical distress. Continue to progress workloads to maintain intensity without signs/symptoms of physical distress. Continue to progress workloads to maintain intensity without signs/symptoms of physical distress. Continue to progress workloads to maintain intensity without signs/symptoms of physical distress.   Average METs -- 3.81 4.2 4.87 4.6     Resistance Training   Training Prescription -- Yes Yes Yes Yes   Weight -- 8 lb 8 lb 12 lb 10 lb   Reps -- 10-15 10-15 10-15 10-15     Interval Training   Interval Training -- No No No No     Treadmill   MPH -- 3 3 3.5 --   Grade -- _1 --  Minutes -- _0 --   METs -- 4.54 4.12 5.13 --     Recumbant Bike   Level -- -- -- 4 --   Minutes -- -- -- 15 --     NuStep   Level -- 5 -- 8 --   Minutes -- 15 -- 15 --   METs -- 4.6 -- 4.5 --     REL-XR   Level -- -- -- -- 12   Minutes -- -- -- -- 15   METs -- -- -- -- 4.12     T5 Nustep   Level -- 5 6 -- --   Minutes -- 15 15 -- --   METs -- 3.1 4.3 -- --     Biostep-RELP   Level -- 3 -- 7 --   Minutes -- 15 -- 15 --   METs -- 3 -- 5 --    Row Name 08/17/20 1400 08/30/20 1100           Response to Exercise   Blood Pressure (Admit) 132/70 104/62      Blood Pressure (Exercise) 186/70 154/94      Blood Pressure (Exit) 124/56 124/62      Heart Rate (Admit) 110 bpm 73 bpm      Heart Rate (Exercise) 112 bpm 96 bpm      Heart Rate (Exit) 78 bpm 94 bpm      Rating of Perceived Exertion (Exercise) 14 15      Symptoms none none      Duration Continue with 30 min of aerobic exercise without signs/symptoms of physical distress. Continue with 30 min of aerobic exercise without signs/symptoms of physical distress.      Intensity THRR unchanged THRR unchanged             Progression      Progression Continue to progress workloads to maintain intensity without signs/symptoms of physical distress. Continue to progress workloads to maintain intensity without signs/symptoms of  physical distress.      Average METs 2.45 4.3             Resistance Training      Training Prescription Yes Yes      Weight 10 lb 10 lb      Reps 10-15 10-15             Interval Training      Interval Training Yes Yes      Comments stair steps stair steps             Treadmill      MPH 3 3      Grade 2 2      Minutes 15 15      METs 4.12 4.12             NuStep      Level 8 --      Minutes 15 --             REL-XR      Level 4 6      Minutes 15 15      METs 1.8 4.5             T5 Nustep      Level 6 --      Minutes 15 --      METs 3.9 --             Home Exercise Plan      Plans to continue exercise at --  Home (comment)      Frequency -- Add 3 additional days to program exercise sessions.      Initial Home Exercises Provided -- 08/18/20              Exercise Comments:   Exercise Comments     Row Name 05/12/20 0739 07/28/20 1058         Exercise Comments First full day of exercise!  Patient was oriented to gym and equipment including functions, settings, policies, and procedures.  Patient's individual exercise prescription and treatment plan were reviewed.  All starting workloads were established based on the results of the 6 minute walk test done at initial orientation visit.  The plan for exercise progression was also introduced and progression will be customized based on patient's performance and goals. Pt able to follow exercise prescription today. Patient complained of weight gain over last three months. Also experienced short period of chest discomfort of 2/10 pain. Discomfort passed and was able to continue exercising and attend education without incident. Patient's rhythm recordings and record of weights since beginning rehab were sent to Dr. Laurelyn Sickle office requesting a response from the doctor. Will continue to monitor for progression.               Exercise Goals and Review:   Exercise Goals     Row Name 05/11/20 1454              Exercise Goals   Increase Physical Activity Yes       Intervention Provide advice, education, support and counseling about physical activity/exercise needs.;Develop an individualized exercise prescription for aerobic and resistive training based on initial evaluation findings, risk stratification, comorbidities and participant's personal goals.       Expected Outcomes Short Term: Attend rehab on a regular basis to increase amount of physical activity.;Long Term: Exercising regularly at least 3-5 days a week.;Long Term: Add in home exercise to make exercise part of routine and to increase amount of physical activity.       Increase Strength and Stamina Yes       Intervention Provide advice, education, support and counseling about physical activity/exercise needs.;Develop an individualized exercise prescription for aerobic and resistive training based on initial evaluation findings, risk stratification, comorbidities and participant's personal goals.       Expected Outcomes Short Term: Increase workloads from initial exercise prescription for resistance, speed, and METs.;Short Term: Perform resistance training exercises routinely during rehab and add in resistance training at home;Long Term: Improve cardiorespiratory fitness, muscular endurance and strength as measured by increased METs and functional capacity (6MWT)       Able to understand and use rate of perceived exertion (RPE) scale Yes       Intervention Provide education and explanation on how to use RPE scale       Expected Outcomes Short Term: Able to use RPE daily in rehab to express subjective intensity level;Long Term:  Able to use RPE to guide intensity level when exercising independently       Able to understand and use Dyspnea scale Yes       Intervention Provide education and explanation on how to use Dyspnea scale       Expected Outcomes Short Term: Able to use Dyspnea scale daily in rehab to express subjective sense of shortness of breath  during exertion;Long Term: Able to use Dyspnea scale to guide intensity level when exercising independently       Knowledge and understanding of Target Heart Rate  Range (THRR) Yes       Intervention Provide education and explanation of THRR including how the numbers were predicted and where they are located for reference       Expected Outcomes Short Term: Able to state/look up THRR;Short Term: Able to use daily as guideline for intensity in rehab;Long Term: Able to use THRR to govern intensity when exercising independently       Able to check pulse independently Yes       Intervention Provide education and demonstration on how to check pulse in carotid and radial arteries.;Review the importance of being able to check your own pulse for safety during independent exercise       Expected Outcomes Short Term: Able to explain why pulse checking is important during independent exercise;Long Term: Able to check pulse independently and accurately       Understanding of Exercise Prescription Yes       Intervention Provide education, explanation, and written materials on patient's individual exercise prescription       Expected Outcomes Short Term: Able to explain program exercise prescription;Long Term: Able to explain home exercise prescription to exercise independently                Exercise Goals Re-Evaluation :  Exercise Goals Re-Evaluation     Row Name 05/12/20 0739 05/24/20 0744 06/09/20 1407 06/22/20 1527 06/30/20 0747     Exercise Goal Re-Evaluation   Exercise Goals Review Increase Physical Activity;Able to understand and use rate of perceived exertion (RPE) scale;Knowledge and understanding of Target Heart Rate Range (THRR);Understanding of Exercise Prescription;Able to understand and use Dyspnea scale;Able to check pulse independently;Increase Strength and Stamina Increase Physical Activity;Able to understand and use rate of perceived exertion (RPE) scale;Knowledge and understanding of  Target Heart Rate Range (THRR);Understanding of Exercise Prescription;Increase Strength and Stamina;Able to check pulse independently;Able to understand and use Dyspnea scale -- Increase Physical Activity;Increase Strength and Stamina;Understanding of Exercise Prescription --   Comments Reviewed RPE and dyspnea scales, THR and program prescription with pt today.  Pt voiced understanding and was given a copy of goals to take home. Patient has been gradually increasing workloads on machines in class to keep levels at a challenging intenstity. He works with varitiety of speed/elevation combinations on the TM. He stated that he feels he is getting stronger and doing will with his exercise. All vital signs during exercise have been in acceptable ranges. [name] has just returned after vacation where he did a lot of walking. He did increase level on T5 today.  His average MET level was 4.2.  Staff will monitor progress. [name] is doing well in rehab.  He is now up to 3.5 mph on the treadmill at 3%.  He really enjoys pushing himself on the treadmill.  We will continue to monitor his progress. Out since last review   Expected Outcomes Short: Use RPE daily to regulate intensity. Long: Follow program prescription in THR. Short: continue to attend cardiac rehab classes consistently and make gradual progression with exercise intensity. Long: become independent with execise progam. Short:  get back to regular attendance Long:  improve overall stamina Short: Continue increase workloads Long; Continue to improve stamina --    Row Name 07/21/20 0901 07/26/20 0804 08/02/20 0943 08/17/20 1455 08/18/20 1116     Exercise Goal Re-Evaluation   Exercise Goals Review -- Increase Physical Activity;Increase Strength and Stamina;Understanding of Exercise Prescription Increase Physical Activity;Increase Strength and Stamina Increase Physical Activity;Increase Strength and Stamina;Understanding of Exercise Prescription  Increase Physical  Activity;Increase Strength and Stamina   Comments Out since last review.  Last day attended 4/4 due to family and then COVID + [name] is back in rehab starting back last Friday.  He continues to stay active at home.  He goes to the dog park daily with his dog and walks while they play.  He does 6 laps with calesthenics.  He is feeling better overall and rebuilding back to his pre COVID stamina.  Prior to COVID, his stamina was doing much better. [name] is getting back to regular attendance.  He uses 10 lb weights for strength work.  Staff will monitor progress. [name] is doing well in rehab.  He likes to alternate upper body and lower body on the NuStep.  He has also done some stair step intervals, but not consistent with his intervals.  We will continue to monitor his progress. Reviewed home exercise with pt today.  Pt plans to walk and do circuit training for exercise.  Reviewed THR, pulse, RPE, sign and symptoms, pulse oximetery and when to call 911 or MD.  Also discussed weather considerations and indoor options.  Pt voiced understanding.   Expected Outcomes -- Short: Continue to return to rehab regularly Long: Continue to improve stamina. Short: get back to consistent attendance Long:  build overall stamina and MET level Short: Use intervals and reports METs Long: Conitnue to improve stamina. Short: monitor HR when exercising Long: maintain consistent exercise    Row Name 08/23/20 0727 08/30/20 1141           Exercise Goal Re-Evaluation   Exercise Goals Review Increase Physical Activity;Increase Strength and Stamina Increase Physical Activity;Increase Strength and Stamina      Comments Patient reports that he is progressing with his exercise intensities. He feels he is physically able to do everything he wants to do in his daily life and does not have any limitations. He does report exercising at home by walking on days that he does not come to rehab. He is also working with some interval training during his  rehab sessions. [name] is up to level 6 on XR.  He uses 10 lb weights for strength work.  He will have post 6MWT soon and we expect him to improve      Expected Outcomes Short: continue to be consistent with attendence to rehab and increase workout intenstities as tolerated. Long: become independent with exercise program and when he has more time start going to gym at his place of employment. Short:improve post 6MWT  Long:  complete HT program               Discharge Exercise Prescription (Final Exercise Prescription Changes):  Exercise Prescription Changes - 08/30/20 1100       Response to Exercise   Blood Pressure (Admit) 104/62    Blood Pressure (Exercise) 154/94    Blood Pressure (Exit) 124/62    Heart Rate (Admit) 73 bpm    Heart Rate (Exercise) 96 bpm    Heart Rate (Exit) 94 bpm    Rating of Perceived Exertion (Exercise) 15    Symptoms none    Duration Continue with 30 min of aerobic exercise without signs/symptoms of physical distress.    Intensity THRR unchanged      Progression   Progression Continue to progress workloads to maintain intensity without signs/symptoms of physical distress.    Average METs 4.3      Resistance Training   Training Prescription Yes    Weight  10 lb    Reps 10-15      Interval Training   Interval Training Yes    Comments stair steps      Treadmill   MPH 3    Grade 2    Minutes 15    METs 4.12      REL-XR   Level 6    Minutes 15    METs 4.5      Home Exercise Plan   Plans to continue exercise at Home (comment)    Frequency Add 3 additional days to program exercise sessions.    Initial Home Exercises Provided 08/18/20             Nutrition:  Target Goals: Understanding of nutrition guidelines, daily intake of sodium <158m, cholesterol <2040m calories 30% from fat and 7% or less from saturated fats, daily to have 5 or more servings of fruits and vegetables.  Education: All About Nutrition: -Group instruction provided by  verbal, written material, interactive activities, discussions, models, and posters to present general guidelines for heart healthy nutrition including fat, fiber, MyPlate, the role of sodium in heart healthy nutrition, utilization of the nutrition label, and utilization of this knowledge for meal planning. Follow up email sent as well. Written material given at graduation. [facility]  Date 07/28/20  [facility] Review Code 1- Verbalizes Understanding       Biometrics:  Pre Biometrics - 05/11/20 1456       Pre Biometrics   Height 5' 11.1" (1.806 m)    Weight 205 lb 3.2 oz (93.1 kg)    BMI (Calculated) 28.54    Single Leg Stand 9.5 seconds              Nutrition Therapy Plan and Nutrition Goals:  Nutrition Therapy & Goals - 08/23/20 1100       Nutrition Therapy   Diet Heart healthy, low Na, diabetes friendly eating    Drug/Food Interactions Statins/Certain Fruits    Protein (specify units) 80-85g    Fiber 30 grams    Whole Grain Foods 3 servings    Saturated Fats 12 max. grams    Fruits and Vegetables 8 servings/day    Sodium 1.5 grams      Personal Nutrition Goals   Nutrition Goal ST: honor hunger, eating consistently with consistent CHOs and following MyPlate, add fruit, spinach, and nut butter to protein shake to make it more of a meal.  LT: maintain sustainable eating pattern    Comments BG varies between 90-100-120-150. BoMikki Santeeeports not eating much at all for some weeks now. 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 He reports having lost a lot of weight before rehab getiing under 200lbs (198lbs) he is now 240lbs. This could be rebound weight we see from dieting, but the causes could be multifactoral. Re-addressed his BMR 1846kcal ([place] energy needs. Discussed honoring hunger and hunger  cues. He had no questions regaridng general heart healthy eating, reviewed diabetes friendly eating and eating for a healhty microbiome (his request). He wanted to know more about plant based eating - discussed nutrition considerations. He likes eating carrots with poppyseed dressing when not hungry, but wanting to eat.      Intervention Plan   Intervention Prescribe, educate and counsel regarding individualized specific dietary modifications aiming towards targeted core components such as  weight, hypertension, lipid management, diabetes, heart failure and other comorbidities.;Nutrition handout(s) given to patient.    Expected Outcomes Short Term Goal: Understand basic principles of dietary content, such as calories, fat, sodium, cholesterol and nutrients.;Short Term Goal: A plan has been developed with personal nutrition goals set during dietitian appointment.;Long Term Goal: Adherence to prescribed nutrition plan.             Nutrition Assessments:  MEDIFICTS Score Key: ?70 Need to make dietary changes  40-70 Heart Healthy Diet ? 40 Therapeutic Level Cholesterol Diet  Flowsheet Row Cardiac Rehab from 05/11/2020 [facility] Cardiac [facility] Pulmonary Rehab  Picture Your Plate Total Score on Admission 53      Picture Your Plate Scores: <52 Unhealthy dietary pattern with much room for improvement. 41-50 Dietary pattern unlikely to meet recommendations for good health and room for improvement. 51-60 More healthful dietary pattern, with some room for improvement.  >60 Healthy dietary pattern, although there may be some specific behaviors that could be improved.    Nutrition Goals Re-Evaluation:  Nutrition Goals Re-Evaluation     Row Name 05/24/20 0749 07/26/20 0810           Goals   Nutrition Goal -- Meet with dietician and work on weight loss      Comment Has not yet met with program dietician. Appointment will be scheduled with program dietician in his upcoming visits. [name] still has not  met with program dieticain as he was out most of last month with family and COVID.  He has gained almost 14 lbs in the last month while he was out.  He seems to be missing his hunger cues as he states that he will eat a meal and then about 10-15 min later will eat again.  We talked about trying to curb his appetite with gum or mint after a meal.  Also talked about listening to his hunger cures and not just eating constantly.  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      Expected Outcome Short: meet with program dietician to set nutrician goals. Long: follow goals set by patient and program dieticia Short: Meet with dietician Long: Work on hunger cues               Nutrition Goals Discharge (Final Nutrition Goals Re-Evaluation):  Nutrition Goals Re-Evaluation - 07/26/20 0810       Goals   Nutrition Goal Meet with dietician and work on weight loss    Comment [name] still has not met with program dieticain as he was out most of last month with family and COVID.  He has gained almost 14 lbs in the last month while he was out.  He seems to be missing his hunger cues as he states that he will eat a meal and then about 10-15 min later will eat again.  We talked about trying to curb his appetite with gum or mint after a meal.  Also talked about listening to his hunger cures and not just eating constantly.  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    Expected Outcome Short: Meet with dietician Long: Work on hunger cues             Psychosocial: Target Goals: Acknowledge presence or absence of significant depression and/or stress, maximize coping skills, provide positive support system. Participant is able to verbalize types and ability to use techniques and  skills needed for reducing stress and depression.   Education: Stress, Anxiety, and Depression - Group verbal and visual presentation to define topics covered.  Reviews how body is  impacted by stress, anxiety, and depression.  Also discusses healthy ways to reduce stress and to treat/manage anxiety and depression.  Written material given at graduation.   Education: Sleep Hygiene -Provides group verbal and written instruction about how sleep can affect your health.  Define sleep hygiene, discuss sleep cycles and impact of sleep habits. Review good sleep hygiene tips.    Initial Review & Psychosocial Screening:  Initial Psych Review & Screening - 05/04/20 0849       Initial Review   Current issues with None Identified      Family Dynamics   Good Support System? Yes    Comments He can look to his wife, his friends Sonia Side and his kids sometimes. His family cares for him but he would rather support them. Avantae has a good outlook on his health.      Barriers   Psychosocial barriers to participate in program The patient should benefit from training in stress management and relaxation.;There are no identifiable barriers or psychosocial needs.      Screening Interventions   Interventions Encouraged to exercise;To provide support and resources with identified psychosocial needs;Provide feedback about the scores to participant    Expected Outcomes Short Term goal: Utilizing psychosocial counselor, staff and physician to assist with identification of specific Stressors or current issues interfering with healing process. Setting desired goal for each stressor or current issue identified.;Long Term Goal: Stressors or current issues are controlled or eliminated.;Short Term goal: Identification and review with participant of any Quality of Life or Depression concerns found by scoring the questionnaire.;Long Term goal: The participant improves quality of Life and PHQ9 Scores as seen by post scores and/or verbalization of changes             Quality of Life Scores:   Quality of Life - 05/11/20 1456       Quality of Life   Select Quality of Life      Quality of Life Scores    Health/Function Pre 18.6 %    Socioeconomic Pre 23.44 %    Psych/Spiritual Pre 23.93 %    Family Pre 28.8 %    GLOBAL Pre 22.23 %            Scores of 19 and below usually indicate a poorer quality of life in these areas.  A difference of  2-3 points is a clinically meaningful difference.  A difference of 2-3 points in the total score of the Quality of Life Index has been associated with significant improvement in overall quality of life, self-image, physical symptoms, and general health in studies assessing change in quality of life.  PHQ-9: Recent Review Flowsheet Data     Depression screen [facility] 2/9 05/11/2020 09/10/2014   Decreased Interest 0 0   Down, Depressed, Hopeless 0 0   PHQ - 2 Score 0 0   Altered sleeping 3 0   Tired, decreased energy 1 0   Change in appetite 1 0   Feeling bad or failure about yourself  0 0   Trouble concentrating 0 0   Moving slowly or fidgety/restless 0 0   Suicidal thoughts 0 0   PHQ-9 Score 5 0   Difficult doing work/chores Somewhat difficult -      Interpretation of Total Score  Total Score Depression Severity:  1-4 =  Minimal depression, 5-9 = Mild depression, 10-14 = Moderate depression, 15-19 = Moderately severe depression, 20-27 = Severe depression   Psychosocial Evaluation and Intervention:  Psychosocial Evaluation - 05/04/20 0852       Psychosocial Evaluation & Interventions   Interventions Encouraged to exercise with the program and follow exercise prescription;Stress management education;Relaxation education    Comments He can look to his wife, his friends Sonia Side and his kids sometimes. His family cares for him but he would rather support them. [place] has a good outlook on his health.    Expected Outcomes Short: Exercise regularly to support mental health and notify staff of any changes. Long: maintain mental health and well being through teaching of rehab or prescribed medications independently.    Continue Psychosocial Services   Follow up required by staff             Psychosocial Re-Evaluation:  Psychosocial Re-Evaluation     [place] Name 05/24/20 [phone] 07/26/20 0807 08/23/20 0736         Psychosocial Re-Evaluation   Current issues with -- Current Stress Concerns Current Stress Concerns     Comments Patient stated that he had no new stress concerns and is sleeping better currently then he was. [name] is doing well mentally. 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  He is also finishing up classes this week and looking forward to his summer break.  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  He feels that he is doing well with it.  He sleeps well most nights.  He is concerned over his weight gain and headaches he has been having since COVID. He was encouraged to talk to doctor about both of these. [name] reports no new sleep or stress concerns. He does have a big project at work that he is working on and is very involved, but feels like he sleeps well and is able to handle the stress in his life. He does have a doctor  appointment today to discuss his weight concerns.     Expected Outcomes Short: continue to maintain healthy sleeping patterns. Long: Maintain good metal health with positive sleep and stress management habits. Short; Talk to doctor about weight and headaches Long: Continue to work on book proposal Short; Talk to doctor about weight and headaches Long: Continue to work on book proposal     Interventions Encouraged to attend Cardiac Rehabilitation for the exercise Encouraged to attend Cardiac Rehabilitation for the exercise Encouraged to attend Cardiac Rehabilitation for the exercise     Continue Psychosocial Services  Follow up required by staff Follow up required by staff Follow up required by staff              Psychosocial Discharge (Final Psychosocial Re-Evaluation):  Psychosocial Re-Evaluation - 08/23/20 0736       Psychosocial Re-Evaluation    Current issues with Current Stress Concerns    Comments [name] reports no new sleep or stress concerns. He does have a big project at work that he is working on and is very involved, but feels like he sleeps well and is able to handle the stress in his life. He does have a doctor  appointment today to discuss his weight concerns.    Expected Outcomes Short; Talk to doctor about weight and headaches Long: Continue to work on book proposal    Interventions Encouraged to attend Cardiac Rehabilitation for the exercise  Continue Psychosocial Services  Follow up required by staff             Vocational Rehabilitation: Provide vocational rehab assistance to qualifying candidates.   Vocational Rehab Evaluation & Intervention:   Education: Education Goals: Education classes will be provided on a variety of topics geared toward better understanding of heart health and risk factor modification. Participant will state understanding/return demonstration of topics presented as noted by education test scores.  Learning Barriers/Preferences:  Learning Barriers/Preferences - 05/04/20 0848       Learning Barriers/Preferences   Learning Barriers None    Learning Preferences None             General Cardiac Education Topics:  AED/CPR: - Group verbal and written instruction with the use of models to demonstrate the basic use of the AED with the basic ABC's of resuscitation.   Anatomy and Cardiac Procedures: - Group verbal and visual presentation and models provide information about basic cardiac anatomy and function. Reviews the testing methods done to diagnose heart disease and the outcomes of the test results. Describes the treatment choices: Medical Management, Angioplasty, or Coronary Bypass Surgery for treating various heart conditions including Myocardial Infarction, Angina, Valve Disease, and Cardiac Arrhythmias.  Written material given at graduation. Flowsheet Row Cardiac Rehab from  08/04/2020 in [facility] Cardiac and Pulmonary Rehab  Education need identified 05/11/20  Date 05/12/20  Educator SB  Instruction Review Code 1- Verbalizes Understanding       Medication Safety: - Group verbal and visual instruction to review commonly prescribed medications for heart and lung disease. Reviews the medication, class of the drug, and side effects. Includes the steps to properly store meds and maintain the prescription regimen.  Written material given at graduation. Flowsheet Row Cardiac Rehab from 08/04/2020 in [facility] Cardiac and Pulmonary Rehab  Date 08/04/20  Educator SB  Instruction Review Code 1- Verbalizes Understanding       Intimacy: - Group verbal instruction through game format to discuss how heart and lung disease can affect sexual intimacy. Written material given at graduation.. Flowsheet Row Cardiac Rehab from 09/01/2014 in [facility] Cardiac and Pulmonary Rehab  Date 09/01/14  Educator MA  Instruction Review Code (retired) 2- meets goals/outcomes       Know Your Numbers and Heart Failure: - Group verbal and visual instruction to discuss disease risk factors for cardiac and pulmonary disease and treatment options.  Reviews associated critical values for Overweight/Obesity, Hypertension, Cholesterol, and Diabetes.  Discusses basics of heart failure: signs/symptoms and treatments.  Introduces Heart Failure Zone chart for action plan for heart failure.  Written material given at graduation.   Infection Prevention: - Provides verbal and written material to individual with discussion of infection control including proper hand washing and proper equipment cleaning during exercise session. Flowsheet Row Cardiac Rehab from 08/04/2020 in [facility] Cardiac and Pulmonary Rehab  Date 05/04/20  Educator [facility]  Instruction Review Code 1- Verbalizes Understanding       Falls Prevention: - Provides verbal and written material to individual with discussion of falls prevention and  safety. Flowsheet Row Cardiac Rehab from 08/04/2020 in [facility] Cardiac and Pulmonary Rehab  Date 05/04/20  Educator [facility]  Instruction Review Code 1- Verbalizes Understanding       Other: -Provides group and verbal instruction on various topics (see comments)   Knowledge Questionnaire Score:  Knowledge Questionnaire Score - 05/11/20 1458       Knowledge Questionnaire Score   Pre Score 24/26 Education Focus: PAD, Exercise  Core Components/Risk Factors/Patient Goals at Admission:  Personal Goals and Risk Factors at Admission - 05/11/20 1458       Core Components/Risk Factors/Patient Goals on Admission    Weight Management Yes;Weight Loss;Obesity    Intervention Weight Management: Develop a combined nutrition and exercise program designed to reach desired caloric intake, while maintaining appropriate intake of nutrient and fiber, sodium and fats, and appropriate energy expenditure required for the weight goal.;Weight Management: Provide education and appropriate resources to help participant work on and attain dietary goals.;Weight Management/Obesity: Establish reasonable short term and long term weight goals.;Obesity: Provide education and appropriate resources to help participant work on and attain dietary goals.    Admit Weight 205 lb 3.2 oz (93.1 kg)    Goal Weight: Short Term 200 lb (90.7 kg)    Goal Weight: Long Term 195 lb (88.5 kg)    Expected Outcomes Short Term: Continue to assess and modify interventions until short term weight is achieved;Long Term: Adherence to nutrition and physical activity/exercise program aimed toward attainment of established weight goal;Weight Loss: Understanding of general recommendations for a balanced deficit meal plan, which promotes 1-2 lb weight loss per week and includes a negative energy balance of [phone] kcal/d;Understanding recommendations for meals to include 15-35% energy as protein, 25-35% energy from fat, 35-60% energy from  carbohydrates, less than 210m of dietary cholesterol, 20-35 gm of total fiber daily;Understanding of distribution of calorie intake throughout the day with the consumption of 4-5 meals/snacks    Diabetes Yes    Intervention Provide education about signs/symptoms and action to take for hypo/hyperglycemia.;Provide education about proper nutrition, including hydration, and aerobic/resistive exercise prescription along with prescribed medications to achieve blood glucose in normal ranges: Fasting glucose 65-99 mg/dL    Expected Outcomes Short Term: Participant verbalizes understanding of the signs/symptoms and immediate care of hyper/hypoglycemia, proper foot care and importance of medication, aerobic/resistive exercise and nutrition plan for blood glucose control.;Long Term: Attainment of HbA1C < 7%.    Hypertension Yes    Intervention Provide education on lifestyle modifcations including regular physical activity/exercise, weight management, moderate sodium restriction and increased consumption of fresh fruit, vegetables, and low fat dairy, alcohol moderation, and smoking cessation.;Monitor prescription use compliance.    Expected Outcomes Short Term: Continued assessment and intervention until BP is < 140/917mHG in hypertensive participants. < 130/8036mG in hypertensive participants with diabetes, heart failure or chronic kidney disease.;Long Term: Maintenance of blood pressure at goal levels.    Lipids Yes    Intervention Provide education and support for participant on nutrition & aerobic/resistive exercise along with prescribed medications to achieve LDL <86m30mDL >40mg64m Expected Outcomes Short Term: Participant states understanding of desired cholesterol values and is compliant with medications prescribed. Participant is following exercise prescription and nutrition guidelines.;Long Term: Cholesterol controlled with medications as prescribed, with individualized exercise RX and with personalized  nutrition plan. Value goals: LDL < 86mg,13m > 40 mg.             Education:Diabetes - Individual verbal and written instruction to review signs/symptoms of diabetes, desired ranges of glucose level fasting, after meals and with exercise. Acknowledge that pre and post exercise glucose checks will be done for 3 sessions at entry of program. [facility]  Date 05/04/20  Educator JH  In[facility] All[facility] Hospitalruction Review Code 1- Verbalizes Understanding       Core Components/Risk Factors/Patient Goals Review:   Goals and Risk Factor  Review     Row Name 05/24/20 0739 07/26/20 0813 08/23/20 0732         Core Components/Risk Factors/Patient Goals Review   Personal Goals Review Weight Management/Obesity;Hypertension;Lipids;Diabetes Weight Management/Obesity;Hypertension;Lipids;Diabetes Weight Management/Obesity;Hypertension;Lipids;Diabetes     Review Patient reports taking all medications as prescribed. He monitors blood sugars at home and reports numbers in acceptable ranges. His weight has increased slightly and he expressed interest in intermittent fasting. He was encouraged to be careful with his blood sugar levels and monitor them closely if he chooses to do this. He was not yet met with program dietician but will be scheduled for that in his upcoming visitis. Bob's weight has greatly increased over the last month, almost 14 lbs.  He is eating more and not following hunger cues then feels distended and overly full after eating two meals close together.  We talked about using gum and/or mints to help as well.  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  His sugars are doing okay as far as he feels.  His blood pressures have been good in class but he does not check them at home frequently.  He has been complaining of frequent headaches since COVID, but not check BP.  He was encouraged to check his BP and to contact his doctor about  headaches and weight gain.  He does not think weight is fluid as he has been eating.  He does not seem to be getting enough water either as he never has been good about drinking enough. [name] reports taking all medications as prescribed. He reports that his weight has steadied over the past few days but he is still concerned about the weight he has gained. He has a doctor's appointment today and is planning to talk to his doctor about his concern.     Expected Outcomes Short: continue to attend rehab consistently and take all medications. Long: control risk factors with heart healthy lifestyle choices. Short: Call doctor about headaches and weight gain Long: Continue to monitor his risk factors. Short: Meet with doctor and discuss weight gain concerns. Long: continue to montior risk factors.              Core Components/Risk Factors/Patient Goals at Discharge (Final Review):   Goals and Risk Factor Review - 08/23/20 0732       Core Components/Risk Factors/Patient Goals Review   Personal Goals Review Weight Management/Obesity;Hypertension;Lipids;Diabetes    Review [name] reports taking all medications as prescribed. He reports that his weight has steadied over the past few days but he is still concerned about the weight he has gained. He has a doctor's appointment today and is planning to talk to his doctor about his concern.    Expected Outcomes Short: Meet with doctor and discuss weight gain concerns. Long: continue to montior risk factors.             ITP Comments:  ITP Comments     Row Name 05/04/20 0854 05/11/20 1154 05/12/20 0653 05/12/20 0739 06/09/20 0959   ITP Comments Virtual Visit completed. Patient informed on EP and RD appointment and 6 Minute walk test. Patient also informed of patient health questionnaires on My Chart. Patient Verbalizes understanding. Visit diagnosis can be found in [facility] 04/21/2020. Completed 6MWT and gym orientation. Initial ITP created and sent for review to Dr.  Emily Filbert, Medical Director. 30 Day review completed. Medical Director ITP review done, changes made as directed, and signed approval by Medical Director.  New to  program First full day of exercise!  Patient was oriented to gym and equipment including functions, settings, policies, and procedures.  Patient's individual exercise prescription and treatment plan were reviewed.  All starting workloads were established based on the results of the 6 minute walk test done at initial orientation visit.  The plan for exercise progression was also introduced and progression will be customized based on patient's performance and goals. 30 Day review completed. Medical Director ITP review done, changes made as directed, and signed approval by Medical Director.    Row Name 06/30/20 [phone] 07/07/20 4562 07/07/20 0752 07/21/20 0901 07/23/20 0900   ITP Comments Patient called out today.  He had been out previously as he was sick.  Now he is out of town taking care of his brother who is coming home from the hospital in [place].  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 30 Day review completed. Medical Director ITP review done, changes made as directed, and signed approval by Medical Director. [name] has not attended since last review due to illness and taking care of family member. Out due to COVID return on 5/6 [name] returned today after being out with COVID.  He was encouraged to builld back slowly.  He noted increased fatigue today.  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    [place] Name 07/28/20 1058 08/04/20 0739 08/10/20 1038 08/23/20 1100 09/01/20 0839   ITP Comments Pt able to follow exercise prescription today. Patient complained of weight gain over last three months. Also experienced short period of chest discomfort of 2/10 pain. Discomfort passed and was able to continue exercising and attend education without incident. Patient's rhythm recordings and  record of weights since beginning rehab were sent to Dr. Laurelyn Sickle office requesting a response from the doctor. Will continue to monitor for progression. 30 Day review completed. Medical Director ITP review done, changes made as directed, and signed approval by Medical Director. Unable to complete initial consultation for nutrition as patient was absent for various reasons vacations and family emergency as well as COVID, patient has also missed multiple nutrition appointments - will continue to attempt to complete. Completed initial nutrition consultation 30 Day review completed. Medical Director ITP review done, changes made as directed, and signed approval by Medical Director.            Comments:

## 2020-09-03 ENCOUNTER — Other Ambulatory Visit: Payer: Self-pay

## 2020-09-03 ENCOUNTER — Encounter: Payer: Medicare PPO | Admitting: *Deleted

## 2020-09-03 DIAGNOSIS — I214 Non-ST elevation (NSTEMI) myocardial infarction: Secondary | ICD-10-CM

## 2020-09-03 NOTE — Progress Notes (Signed)
Daily Session Note  Patient Details  Name: AMBER GUTHRIDGE MRN: [id number] Date of Birth: 1950-06-19 Referring Provider:   Flowsheet Row Cardiac Rehab from 05/11/2020 in [facility] Cardiac and Pulmonary Rehab  Referring Provider [name] MD       Encounter Date: 09/03/2020  Check In:  Session Check In - 09/03/20 0943       Check-In   Supervising physician immediately available to respond to emergencies See telemetry face sheet for immediately available ER MD    Location [facility]-Cardiac & Pulmonary Rehab    Staff Present Renita Papa, RN BSN;Joseph Hood RCP,RRT,BSRT;Melissa [place] RDN, LDN    Virtual Visit No    Medication changes reported     No    Fall or balance concerns reported    No    Warm-up and Cool-down Performed on first and last piece of equipment    Resistance Training Performed Yes    VAD Patient? No    PAD/SET Patient? No      Pain Assessment   Currently in Pain? No/denies                Social History   Tobacco Use  Smoking Status Former   Packs/day: 0.50   Years: 1.00   Pack years: 0.50   Types: Cigarettes   Quit date: 05/04/1965   Years since quitting: 55.3  Smokeless Tobacco Never  Tobacco Comments   smoked for a few months in his early 20's.    Goals Met:  Independence with exercise equipment Exercise tolerated well No report of cardiac concerns or symptoms Strength training completed today  Goals Unmet:  Not Applicable  Comments: Pt able to follow exercise prescription today without complaint.  Will continue to monitor for progression.    Dr. Emily Filbert is Medical Director for [place].  Dr. Ottie Glazier is Medical Director for [facility] Pulmonary Rehabilitation.

## 2020-09-06 ENCOUNTER — Encounter: Payer: Medicare PPO | Admitting: *Deleted

## 2020-09-06 ENCOUNTER — Other Ambulatory Visit: Payer: Self-pay

## 2020-09-06 DIAGNOSIS — I214 Non-ST elevation (NSTEMI) myocardial infarction: Secondary | ICD-10-CM | POA: Diagnosis not present

## 2020-09-06 NOTE — Progress Notes (Signed)
Daily Session Note  Patient Details  Name: Jake Castro MRN: [id number] Date of Birth: 05/18/1950 Referring Provider:   Flowsheet Row Cardiac Rehab from 05/11/2020 in [facility] Cardiac and Pulmonary Rehab  Referring Provider Khan, Shaukat MD       Encounter Date: 09/06/2020  Check In:  Session Check In - 09/06/20 0818       Check-In   Supervising physician immediately available to respond to emergencies See telemetry face sheet for immediately available ER MD    Location [facility]-Cardiac & Pulmonary Rehab    Staff Present Kelly Hayes, BS, ACSM CEP, Exercise Physiologist;Joseph Hood RCP,RRT,BSRT;Susanne Bice, RN, BSN, CCRP    Virtual Visit No    Medication changes reported     No    Fall or balance concerns reported    No    Warm-up and Cool-down Performed on first and last piece of equipment    Resistance Training Performed Yes    VAD Patient? No    PAD/SET Patient? No      Pain Assessment   Currently in Pain? No/denies                Social History   Tobacco Use  Smoking Status Former   Packs/day: 0.50   Years: 1.00   Pack years: 0.50   Types: Cigarettes   Quit date: 05/04/1965   Years since quitting: 55.3  Smokeless Tobacco Never  Tobacco Comments   smoked for a few months in his early 20's.    Goals Met:  Independence with exercise equipment Exercise tolerated well No report of cardiac concerns or symptoms  Goals Unmet:  Not Applicable  Comments: Pt able to follow exercise prescription today without complaint.  Will continue to monitor for progression.    Dr. Mark Miller is Medical Director for [facility].  Dr. Fuad Aleskerov is Medical Director for [facility]monary [facility]. 

## 2020-09-08 ENCOUNTER — Other Ambulatory Visit: Payer: Self-pay

## 2020-09-08 DIAGNOSIS — I214 Non-ST elevation (NSTEMI) myocardial infarction: Secondary | ICD-10-CM

## 2020-09-08 NOTE — Progress Notes (Signed)
Daily Session Note  Patient Details  Name: Jake Castro MRN: [id number] Date of Birth: September 13, 1950 Referring Provider:   Flowsheet Row Cardiac Rehab from 05/11/2020 in [facility] Cardiac and Pulmonary Rehab  Referring Provider [name] MD       Encounter Date: 09/08/2020  Check In:  Session Check In - 09/08/20 0950       Check-In   Supervising physician immediately available to respond to emergencies See telemetry face sheet for immediately available ER MD    Location [facility]-Cardiac & Pulmonary Rehab    Staff Present Birdie Sons, MPA, Elveria Rising, BA, ACSM CEP, Exercise Physiologist;Joseph Tessie Fass RCP,RRT,BSRT    Virtual Visit No    Medication changes reported     No    Fall or balance concerns reported    No    Warm-up and Cool-down Performed on first and last piece of equipment    Resistance Training Performed Yes    VAD Patient? No    PAD/SET Patient? No      Pain Assessment   Currently in Pain? No/denies                Social History   Tobacco Use  Smoking Status Former   Packs/day: 0.50   Years: 1.00   Pack years: 0.50   Types: Cigarettes   Quit date: 05/04/1965   Years since quitting: 55.3  Smokeless Tobacco Never  Tobacco Comments   smoked for a few months in his early 20's.    Goals Met:  Independence with exercise equipment No report of cardiac concerns or symptoms  Goals Unmet:  Not Applicable  Comments: Pt able to follow exercise prescription today, stretched and cooled down.  Will continue to monitor for progression.    Dr. Emily Filbert is Medical Director for [place].  Dr. Ottie Glazier is Medical Director for [facility] Pulmonary Rehabilitation.

## 2020-09-09 NOTE — Patient Instructions (Signed)
Discharge Patient Instructions  Patient Details  Name: Jake Castro MRN: [id number] Date of Birth: 11/05/1950 Referring Provider:  Khan, Shaukat A, MD   Number of Visits: 36  Reason for Discharge:  Patient reached a stable level of exercise. Patient independent in their exercise. Patient has met program and personal goals.  Smoking History:  Social History   Tobacco Use  Smoking Status Former   Packs/day: 0.50   Years: 1.00   Pack years: 0.50   Types: Cigarettes   Quit date: 05/04/1965   Years since quitting: 55.3  Smokeless Tobacco Never  Tobacco Comments   smoked for a few months in his early 20's.    Diagnosis:  NSTEMI (non-ST elevated myocardial infarction) (HCC)  Initial Exercise Prescription:  Initial Exercise Prescription - 05/11/20 1100       Date of Initial Exercise RX and Referring Provider   Date 05/11/20    Referring Provider Khan, Shaukat MD      Treadmill   MPH 2.5    Grade 0.5    Minutes 15    METs 3.09      Recumbant Bike   Level 3    RPM 50    Watts 36    Minutes 15    METs 3      NuStep   Level 3    SPM 80    Minutes 15    METs 3      T5 Nustep   Level 3    SPM 80    Minutes 15    METs 3      Prescription Details   Frequency (times per week) 3    Duration Progress to 30 minutes of continuous aerobic without signs/symptoms of physical distress      Intensity   THRR 40-80% of Max Heartrate 105-134    Ratings of Perceived Exertion 11-13    Perceived Dyspnea 0-4      Progression   Progression Continue to progress workloads to maintain intensity without signs/symptoms of physical distress.      Resistance Training   Training Prescription Yes    Weight 4 lb    Reps 10-15             Discharge Exercise Prescription (Final Exercise Prescription Changes):  Exercise Prescription Changes - 08/30/20 1100       Response to Exercise   Blood Pressure (Admit) 104/62    Blood Pressure (Exercise) 154/94    Blood  Pressure (Exit) 124/62    Heart Rate (Admit) 73 bpm    Heart Rate (Exercise) 96 bpm    Heart Rate (Exit) 94 bpm    Rating of Perceived Exertion (Exercise) 15    Symptoms none    Duration Continue with 30 min of aerobic exercise without signs/symptoms of physical distress.    Intensity THRR unchanged      Progression   Progression Continue to progress workloads to maintain intensity without signs/symptoms of physical distress.    Average METs 4.3      Resistance Training   Training Prescription Yes    Weight 10 lb    Reps 10-15      Interval Training   Interval Training Yes    Comments stair steps      Treadmill   MPH 3    Grade 2    Minutes 15    METs 4.12      REL-XR   Level 6    Minutes 15    METs 4.5        Home Exercise Plan   Plans to continue exercise at Home (comment)    Frequency Add 3 additional days to program exercise sessions.    Initial Home Exercises Provided 08/18/20             Functional Capacity:  6 Minute Walk     Row Name 05/11/20 1156 09/01/20 1141       6 Minute Walk   Phase Initial Discharge    Distance 1435 feet 1765 feet    Distance % Change -- 22.9 %    Distance Feet Change -- 330 ft    Walk Time 6 minutes 6 minutes    # of Rest Breaks 0 0    MPH 2.72 3.34    METS 3.22 4    RPE 15 14    Perceived Dyspnea  1 --    VO2 Peak 11.27 14    Symptoms Yes (comment) No    Comments R leg calf pain and L thigh pain 4/10 --    Resting HR 76 bpm 73 bpm    Resting BP 132/70 120/60    Resting Oxygen Saturation  98 % 97 %    Exercise Oxygen Saturation  during 6 min walk 98 % 97 %    Max Ex. HR 106 bpm 110 bpm    Max Ex. BP 142/74 198/60    2 Minute Post BP 124/62 --             Nutrition:  Nutrition Therapy & Goals - 08/23/20 1100       Nutrition Therapy   Diet Heart healthy, low Na, diabetes friendly eating    Drug/Food Interactions Statins/Certain Fruits    Protein (specify units) 80-85g    Fiber 30 grams    Whole Grain  Foods 3 servings    Saturated Fats 12 max. grams    Fruits and Vegetables 8 servings/day    Sodium 1.5 grams      Personal Nutrition Goals   Nutrition Goal ST: honor hunger, eating consistently with consistent CHOs and following MyPlate, add fruit, spinach, and nut butter to protein shake to make it more of a meal.  LT: maintain sustainable eating pattern    Comments BG varies between 90-100-120-150. Bob reports not eating much at all for some weeks now. 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 He reports having lost a lot of weight before rehab getiing under 200lbs (198lbs) he is now 240lbs. This could be rebound weight we see from dieting, but the causes could be multifactoral. Re-addressed his BMR 1846kcal ([place] [address] Jeor) and energy needs. Discussed honoring hunger and hunger cues. He had no questions regaridng general heart healthy eating, reviewed diabetes friendly eating and eating for a healhty microbiome (his request). He wanted to know more about plant based eating - discussed nutrition considerations. He likes eating carrots with poppyseed dressing when not hungry, but wanting to eat.      Intervention Plan   Intervention Prescribe, educate and counsel regarding individualized specific dietary modifications aiming towards targeted core components such as weight, hypertension, lipid management, diabetes, heart failure and other comorbidities.;Nutrition handout(s) given to patient.    Expected Outcomes Short Term Goal: Understand basic principles of dietary content, such as calories, fat, sodium, cholesterol and nutrients.;Short Term Goal: A plan has been developed with personal nutrition goals set during dietitian appointment.;Long Term Goal: Adherence to prescribed nutrition plan.             Goals reviewed with patient; copy given to patient.

## 2020-09-13 ENCOUNTER — Other Ambulatory Visit: Payer: Self-pay

## 2020-09-13 ENCOUNTER — Encounter: Payer: Medicare PPO | Admitting: *Deleted

## 2020-09-13 DIAGNOSIS — I214 Non-ST elevation (NSTEMI) myocardial infarction: Secondary | ICD-10-CM | POA: Diagnosis not present

## 2020-09-13 NOTE — Progress Notes (Signed)
Discharge Progress Report  Patient Details  Name: Jake Castro MRN: [id number] Date of Birth: 1950-07-22 Referring Provider:   Flowsheet Row Cardiac Rehab from 05/11/2020 in [facility] Cardiac and Pulmonary Rehab  Referring Provider Adrian Blackwater MD        Number of Visits: 35  Reason for Discharge:  Patient reached a stable level of exercise. Patient independent in their exercise.  Smoking History:  Social History   Tobacco Use  Smoking Status Former   Packs/day: 0.50   Years: 1.00   Pack years: 0.50   Types: Cigarettes   Quit date: 05/04/1965   Years since quitting: 55.4  Smokeless Tobacco Never  Tobacco Comments   smoked for a few months in his early 20's.    Diagnosis:  NSTEMI (non-ST elevated myocardial infarction) [facility])   Initial Exercise Prescription:  Initial Exercise Prescription - 05/11/20 1100       Date of Initial Exercise RX and Referring Provider   Date 05/11/20    Referring Provider Adrian Blackwater MD      Treadmill   MPH 2.5    Grade 0.5    Minutes 15    METs 3.09      Recumbant Bike   Level 3    RPM 50    Watts 36    Minutes 15    METs 3      NuStep   Level 3    SPM 80    Minutes 15    METs 3      T5 Nustep   Level 3    SPM 80    Minutes 15    METs 3      Prescription Details   Frequency (times per week) 3    Duration Progress to 30 minutes of continuous aerobic without signs/symptoms of physical distress      Intensity   THRR 40-80% of Max Heartrate 105-134    Ratings of Perceived Exertion 11-13    Perceived Dyspnea 0-4      Progression   Progression Continue to progress workloads to maintain intensity without signs/symptoms of physical distress.      Resistance Training   Training Prescription Yes    Weight 4 lb    Reps 10-15             Discharge Exercise Prescription (Final Exercise Prescription Changes):  Exercise Prescription Changes - 08/30/20 1100       Response to Exercise   Blood Pressure (Admit)  104/62    Blood Pressure (Exercise) 154/94    Blood Pressure (Exit) 124/62    Heart Rate (Admit) 73 bpm    Heart Rate (Exercise) 96 bpm    Heart Rate (Exit) 94 bpm    Rating of Perceived Exertion (Exercise) 15    Symptoms none    Duration Continue with 30 min of aerobic exercise without signs/symptoms of physical distress.    Intensity THRR unchanged      Progression   Progression Continue to progress workloads to maintain intensity without signs/symptoms of physical distress.    Average METs 4.3      Resistance Training   Training Prescription Yes    Weight 10 lb    Reps 10-15      Interval Training   Interval Training Yes    Comments stair steps      Treadmill   MPH 3    Grade 2    Minutes 15    METs 4.12      REL-XR  Level 6    Minutes 15    METs 4.5      Home Exercise Plan   Plans to continue exercise at Home (comment)    Frequency Add 3 additional days to program exercise sessions.    Initial Home Exercises Provided 08/18/20             Functional Capacity:  6 Minute Walk     Row Name 05/11/20 1156 09/01/20 1141       6 Minute Walk   Phase Initial Discharge    Distance 1435 feet 1765 feet    Distance % Change -- 22.9 %    Distance Feet Change -- 330 ft    Walk Time 6 minutes 6 minutes    # of Rest Breaks 0 0    MPH 2.72 3.34    METS 3.22 4    RPE 15 14    Perceived Dyspnea  1 --    VO2 Peak 11.27 14    Symptoms Yes (comment) No    Comments R leg calf pain and L thigh pain 4/10 --    Resting HR 76 bpm 73 bpm    Resting BP 132/70 120/60    Resting Oxygen Saturation  98 % 97 %    Exercise Oxygen Saturation  during 6 min walk 98 % 97 %    Max Ex. HR 106 bpm 110 bpm    Max Ex. BP 142/74 198/60    2 Minute Post BP 124/62 --              Nutrition & Weight - Outcomes:  Pre Biometrics - 05/11/20 1456       Pre Biometrics   Height 5' 11.1" (1.806 m)    Weight 205 lb 3.2 oz (93.1 kg)    BMI (Calculated) 28.54    Single Leg Stand 9.5  seconds           Post weight 235.9   Nutrition:  Nutrition Therapy & Goals - 08/23/20 1100       Nutrition Therapy   Diet Heart healthy, low Na, diabetes friendly eating    Drug/Food Interactions Statins/Certain Fruits    Protein (specify units) 80-85g    Fiber 30 grams    Whole Grain Foods 3 servings    Saturated Fats 12 max. grams    Fruits and Vegetables 8 servings/day    Sodium 1.5 grams      Personal Nutrition Goals   Nutrition Goal ST: honor hunger, eating consistently with consistent CHOs and following MyPlate, add fruit, spinach, and nut butter to protein shake to make it more of a meal.  LT: maintain sustainable eating pattern    Comments BG varies between 90-100-120-150. Nadine Counts reports not eating much at all for some weeks now. 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 He reports having lost a lot of weight before rehab getiing under 200lbs (198lbs) he is now 240lbs. This could be rebound weight we see from dieting, but the causes could be multifactoral. Re-addressed his BMR 1846kcal ([address] Jeor) and energy needs. Discussed honoring hunger and hunger cues. He had no questions regaridng general heart healthy eating, reviewed diabetes friendly eating and eating for a healhty microbiome (his request). He wanted to know more about plant based eating - discussed nutrition considerations. He likes eating carrots with poppyseed dressing when not hungry, but wanting to eat.  Intervention Plan   Intervention Prescribe, educate and counsel regarding individualized specific dietary modifications aiming towards targeted core components such as weight, hypertension, lipid management, diabetes, heart failure and other comorbidities.;Nutrition handout(s) given to patient.    Expected Outcomes Short Term Goal: Understand basic principles of dietary content, such as calories, fat, sodium,  cholesterol and nutrients.;Short Term Goal: A plan has been developed with personal nutrition goals set during dietitian appointment.;Long Term Goal: Adherence to prescribed nutrition plan.            Goals reviewed with patient; copy given to patient.

## 2020-09-13 NOTE — Progress Notes (Signed)
Daily Session Note  Patient Details  Name: Jake Castro MRN: [id number] Date of Birth: April 02, 1950 Referring Provider:   Flowsheet Row Cardiac Rehab from 05/11/2020 in [facility] Cardiac and Pulmonary Rehab  Referring Provider [name] MD       Encounter Date: 09/13/2020  Check In:  Session Check In - 09/13/20 0806       Check-In   Supervising physician immediately available to respond to emergencies See telemetry face sheet for immediately available ER MD    Location [facility]-Cardiac & Pulmonary Rehab    Staff Present Earlean Shawl, BS, ACSM CEP, Exercise Physiologist;Joseph Flavia Shipper;Heath Lark, RN, BSN, CCRP    Virtual Visit No    Medication changes reported     No    Fall or balance concerns reported    No    Warm-up and Cool-down Performed on first and last piece of equipment    Resistance Training Performed Yes    VAD Patient? No    PAD/SET Patient? No      Pain Assessment   Currently in Pain? No/denies                Social History   Tobacco Use  Smoking Status Former   Packs/day: 0.50   Years: 1.00   Pack years: 0.50   Types: Cigarettes   Quit date: 05/04/1965   Years since quitting: 55.4  Smokeless Tobacco Never  Tobacco Comments   smoked for a few months in his early 20's.    Goals Met:  Independence with exercise equipment Exercise tolerated well Personal goals reviewed No report of cardiac concerns or symptoms  Goals Unmet:  Not Applicable  Comments:  [name] graduated today from  rehab with 35 sessions completed.  Details of the patient's exercise prescription and what He needs to do in order to continue the prescription and progress were discussed with patient.  Patient was given a copy of prescription and goals.  Patient verbalized understanding.  [name] plans to continue to exercise by Home exercises and has [employer] .    Dr. Emily Filbert is Medical Director for [place].  Dr. Ottie Glazier is Medical Director  for [facility] Pulmonary Rehabilitation.

## 2020-09-13 NOTE — Progress Notes (Signed)
Cardiac Individual Treatment Plan  Patient Details  Name: Jake Castro MRN: [id number] Date of Birth: 1951-01-01 Referring Provider:   Flowsheet Row Cardiac Rehab from 05/11/2020 in [facility] Cardiac and Pulmonary Rehab  Referring Provider [name] MD       Initial Encounter Date:  Flowsheet Row Cardiac Rehab from 05/11/2020 in [facility] Cardiac and Pulmonary Rehab  Date 05/11/20       Visit Diagnosis: NSTEMI (non-ST elevated myocardial infarction) [facility])  Patient's Home Medications on Admission:  Current Outpatient Medications:    acetaminophen (TYLENOL) 325 MG tablet, Take 2 tablets (650 mg total) by mouth every 6 (six) hours as needed for mild pain (or Fever >/= 101). (Patient not taking: Reported on 05/04/2020), Disp: , Rfl:    Ascorbic Acid (VITAMIN C) 1000 MG tablet, Take 1,000 mg by mouth daily. (Patient not taking: No sig reported), Disp: , Rfl:    aspirin 325 MG tablet, Take by mouth., Disp: , Rfl:    aspirin 81 MG tablet, Take 81 mg by mouth daily. Am (Patient not taking: No sig reported), Disp: , Rfl:    Blood Glucose Monitoring Suppl (ONETOUCH VERIO FLEX SYSTEM) w/Device KIT, U UTD TO TEST BLOOD SUGAR, Disp: , Rfl:    carvedilol (COREG) 12.5 MG tablet, Take 12.5 mg by mouth 2 (two) times daily with a meal., Disp: , Rfl:    chlorhexidine (PERIDEX) 0.12 % solution, chlorhexidine gluconate 0.12 % mouthwash  RINSE AND SPIT WITH 1/2 OZ FOR 30 SECONDS BID FOR 2 WEEKS, Disp: , Rfl:    Cholecalciferol (VITAMIN D-3) 5000 UNITS TABS, Take 5,000 mg by mouth daily., Disp: , Rfl:    Exenatide ER 2 MG SRER, Inject into the skin once a week. (Patient not taking: Reported on 05/04/2020), Disp: , Rfl:    fenofibrate (TRICOR) 145 MG tablet, Take 145 mg by mouth daily., Disp: , Rfl:    ferrous sulfate 325 (65 FE) MG tablet, Take by mouth. (Patient not taking: Reported on 05/04/2020), Disp: , Rfl:    glucose blood (ONETOUCH ULTRA) test strip, U UTD TO TEST BLOOD SUGAR (Patient not taking: Reported  on 05/04/2020), Disp: , Rfl:    glucose blood (PRECISION QID TEST) test strip, U UTD TO TEST BLOOD SUGAR (Patient not taking: Reported on 05/04/2020), Disp: , Rfl:    isosorbide mononitrate (IMDUR) 30 MG 24 hr tablet, Take 1 tablet (30 mg total) by mouth daily., Disp: 30 tablet, Rfl: 0   KRILL OIL PO, Take 1,000 mg by mouth daily.  (Patient not taking: No sig reported), Disp: , Rfl:    l-methylfolate-B6-B12 (METANX) 3-35-2 MG TABS tablet, Take 1 tablet by mouth daily. am (Patient not taking: No sig reported), Disp: , Rfl:    lisinopril (PRINIVIL,ZESTRIL) 40 MG tablet, Take 40 mg by mouth daily. am, Disp: , Rfl:    metFORMIN (GLUCOPHAGE) 1000 MG tablet, Take 1,000 mg by mouth 2 (two) times daily with a meal. Am and bedtime, Disp: , Rfl:    Methylcobalamin 1 MG CHEW, Chew by mouth. (Patient not taking: No sig reported), Disp: , Rfl:    Multiple Vitamin (MULTIVITAMIN) capsule, Take 1 capsule by mouth daily. (Patient not taking: No sig reported), Disp: , Rfl:    Omega-3 Fatty Acids (FISH OIL) 1000 MG CAPS, omega-3 acid ethyl esters 1 gram capsule, Disp: , Rfl:    rosuvastatin (CRESTOR) 40 MG tablet, Take 1 tablet (40 mg total) by mouth daily., Disp: 30 tablet, Rfl: 0   Semaglutide,0.25 or 0.5MG /DOS, (OZEMPIC, 0.25  OR 0.5 MG/DOSE,) 2 MG/1.5ML SOPN, Ozempic 0.25 mg or 0.5 mg (2 mg/1.5 mL) subcutaneous pen injector, Disp: , Rfl:    Specialty Vitamins Products (PROSTATE PO), Take by mouth daily. B6 $RemoveB'10mg'dtIXawhq$  and zinc $Remove'30mg'oWCLAzu$  (Patient not taking: No sig reported), Disp: , Rfl:    ticagrelor (BRILINTA) 90 MG TABS tablet, Take 1 tablet (90 mg total) by mouth 2 (two) times daily., Disp: 60 tablet, Rfl: 0   triamcinolone cream (KENALOG) 0.5 %, triamcinolone acetonide 0.5 % topical cream, Disp: , Rfl:    Ubiquinol 200 MG CAPS, Take by mouth daily. (Patient not taking: No sig reported), Disp: , Rfl:    VITAMIN K PO, Take 1 tablet by mouth daily., Disp: , Rfl:  No current facility-administered medications for this  visit.  Facility-Administered Medications Ordered in Other Visits:    sodium chloride flush (NS) 0.9 % injection 3 mL, 3 mL, Intravenous, Q12H, Dionisio [name], MD  Past Medical History: Past Medical History:  Diagnosis Date   CAD (coronary artery disease)    a. 12/2006 NSTEMI/PCI: RCA 67m/d->mid vessel stented, unable to cross dist dzs w/ balloon;  b. 11/2013 NL MV;  c. 11/2013 Echo: EF 55%;  d. 12/2013 Cath: LM 95 ost, LAD 70p, 59m, LCX 30p/m, OM1 60, RCA 20p, 78m/13m, 95d.   Carotid arterial disease (HCC)    a. s/p LCEA   Diabetes mellitus ([place])    type 2   HTN (hypertension)    Hyperlipidemia    Hypertension    Morbid obesity ([place])    Myocardial infarction ([place])    Neuromuscular disorder ([place])    neuropathy left foot   Neuropathy    LEFT FOOT   Plantar fasciitis    Pneumonia    Wears dentures    full upper and lower partial    Tobacco Use: Social History   Tobacco Use  Smoking Status Former   Packs/day: 0.50   Years: 1.00   Pack years: 0.50   Types: Cigarettes   Quit date: 05/04/1965   Years since quitting: 55.4  Smokeless Tobacco Never  Tobacco Comments   smoked for a few months in his early 20's.    Labs: Recent Review Flowsheet Data     Labs for ITP Cardiac and Pulmonary Rehab Latest Ref Rng & Units 01/03/2014 01/03/2014 01/04/2014 04/21/2020 04/22/2020   Cholestrol 0 - 200 mg/dL - - - - 121   LDLCALC 0 - 99 mg/dL - - - - 46   HDL >40 mg/dL - - - - 46   Trlycerides <150 mg/dL - - - - 146   Hemoglobin A1c 4.8 - 5.6 % - - - 6.7(H) -   PHART 7.350 - 7.450 7.344(L) - - - -   PCO2ART 35.0 - 45.0 mmHg 52.4(H) - - - -   HCO3 20.0 - 28.0 mmol/L 28.5(H) - - 29.2(H) -   TCO2 0 - 100 mmol/L $RemoveBe'30 30 26 'bdIYfjswl$ - -   ACIDBASEDEF 0.0 - 2.0 mmol/L - - - - -   O2SAT % 95.0 - - 60.8 -        Exercise Target Goals: Exercise Program Goal: Individual exercise prescription set using results from initial 6 min walk test and THRR while considering  patient's activity barriers and  safety.   Exercise Prescription Goal: Initial exercise prescription builds to 30-45 minutes a day of aerobic activity, 2-3 days per week.  Home exercise guidelines will be given to patient during program as part of exercise prescription that the participant  will acknowledge.   Education: Aerobic Exercise: - Group verbal and visual presentation on the components of exercise prescription. Introduces F.I.T.T principle from ACSM for exercise prescriptions.  Reviews F.I.T.T. principles of aerobic exercise including progression. Written material given at graduation. Flowsheet Row Cardiac Rehab from 09/08/2020 in [facility] Cardiac and Pulmonary Rehab  Education need identified 05/11/20  Date 09/08/20  Educator [facility]  Instruction Review Code 1- Verbalizes Understanding       Education: Resistance Exercise: - Group verbal and visual presentation on the components of exercise prescription. Introduces F.I.T.T principle from ACSM for exercise prescriptions  Reviews F.I.T.T. principles of resistance exercise including progression. Written material given at graduation. Flowsheet Row Cardiac Rehab from 09/08/2020 in [facility] Cardiac and Pulmonary Rehab  Date 05/12/20  Educator [facility]  Instruction Review Code 1- Verbalizes Understanding        Education: Exercise & Equipment Safety: - Individual verbal instruction and demonstration of equipment use and safety with use of the equipment.   Education: Exercise Physiology & General Exercise Guidelines: - Group verbal and written instruction with models to review the exercise physiology of the cardiovascular system and associated critical values. Provides general exercise guidelines with specific guidelines to those with heart or lung disease.  Flowsheet Row Cardiac Rehab from 09/08/2020 in [facility] Cardiac and Pulmonary Rehab  Date 09/01/20  Educator AS  Instruction Review Code 1- Verbalizes Understanding       Education: Flexibility, Balance, Mind/Body Relaxation: -  Group verbal and visual presentation with interactive activity on the components of exercise prescription. Introduces F.I.T.T principle from ACSM for exercise prescriptions. Reviews F.I.T.T. principles of flexibility and balance exercise training including progression. Also discusses the mind body connection.  Reviews various relaxation techniques to help reduce and manage stress (i.e. Deep breathing, progressive muscle relaxation, and visualization). Balance handout provided to take home. Written material given at graduation. Flowsheet Row Cardiac Rehab from 09/08/2020 in [facility] Cardiac and Pulmonary Rehab  Date 05/19/20  Educator AS  Instruction Review Code 1- Verbalizes Understanding       Activity Barriers & Risk Stratification:  Activity Barriers & Cardiac Risk Stratification - 05/11/20 1158       Activity Barriers & Cardiac Risk Stratification   Activity Barriers Deconditioning;Muscular Weakness;Balance Concerns    Cardiac Risk Stratification High             6 Minute Walk:  6 Minute Walk     Row Name 05/11/20 1156 09/01/20 1141       6 Minute Walk   Phase Initial Discharge    Distance 1435 feet 1765 feet    Distance % Change -- 22.9 %    Distance Feet Change -- 330 ft    Walk Time 6 minutes 6 minutes    # of Rest Breaks 0 0    MPH 2.72 3.34    METS 3.22 4    RPE 15 14    Perceived Dyspnea  1 --    VO2 Peak 11.27 14    Symptoms Yes (comment) No    Comments R leg calf pain and L thigh pain 4/10 --    Resting HR 76 bpm 73 bpm    Resting BP 132/70 120/60    Resting Oxygen Saturation  98 % 97 %    Exercise Oxygen Saturation  during 6 min walk 98 % 97 %    Max Ex. HR 106 bpm 110 bpm    Max Ex. BP 142/74 198/60    2 Minute Post BP 124/62 --  Oxygen Initial Assessment:   Oxygen Re-Evaluation:   Oxygen Discharge (Final Oxygen Re-Evaluation):   Initial Exercise Prescription:  Initial Exercise Prescription - 05/11/20 1100       Date of Initial  Exercise RX and Referring Provider   Date 05/11/20    Referring Provider [name] MD      Treadmill   MPH 2.5    Grade 0.5    Minutes 15    METs 3.09      Recumbant Bike   Level 3    RPM 50    Watts 36    Minutes 15    METs 3      NuStep   Level 3    SPM 80    Minutes 15    METs 3      T5 Nustep   Level 3    SPM 80    Minutes 15    METs 3      Prescription Details   Frequency (times per week) 3    Duration Progress to 30 minutes of continuous aerobic without signs/symptoms of physical distress      Intensity   THRR 40-80% of Max Heartrate 105-134    Ratings of Perceived Exertion 11-13    Perceived Dyspnea 0-4      Progression   Progression Continue to progress workloads to maintain intensity without signs/symptoms of physical distress.      Resistance Training   Training Prescription Yes    Weight 4 lb    Reps 10-15             Perform Capillary Blood Glucose checks as needed.  Exercise Prescription Changes:   Exercise Prescription Changes     Row Name 05/11/20 1100 05/24/20 1500 06/09/20 1400 06/22/20 1500 08/02/20 0900     Response to Exercise   Blood Pressure (Admit) 132/70 134/70 118/60 126/70 122/58   Blood Pressure (Exercise) 142/74 142/76 148/60 174/68 138/60   Blood Pressure (Exit) 124/60 124/62 96/52 112/62 112/56   Heart Rate (Admit) 76 bpm 63 bpm 78 bpm 73 bpm 73 bpm   Heart Rate (Exercise) 106 bpm 116 bpm 129 bpm 133 bpm 112 bpm   Heart Rate (Exit) 75 bpm 83 bpm 83 bpm 108 bpm 85 bpm   Oxygen Saturation (Admit) 98 % -- -- -- --   Oxygen Saturation (Exercise) 98 % -- -- -- --   Rating of Perceived Exertion (Exercise) 15 13 14 14 15    Perceived Dyspnea (Exercise) 1 -- -- -- --   Symptoms R calf pain, L thigh pain 4/10 none none none none   Comments walk test results -- -- -- --   Duration -- Continue with 30 min of aerobic exercise without signs/symptoms of physical distress. Continue with 30 min of aerobic exercise without  signs/symptoms of physical distress. Continue with 30 min of aerobic exercise without signs/symptoms of physical distress. Continue with 30 min of aerobic exercise without signs/symptoms of physical distress.   Intensity -- THRR unchanged THRR unchanged THRR unchanged THRR unchanged     Progression   Progression -- Continue to progress workloads to maintain intensity without signs/symptoms of physical distress. Continue to progress workloads to maintain intensity without signs/symptoms of physical distress. Continue to progress workloads to maintain intensity without signs/symptoms of physical distress. Continue to progress workloads to maintain intensity without signs/symptoms of physical distress.   Average METs -- 3.81 4.2 4.87 4.6     Resistance Training   Training Prescription -- Yes Yes  Yes Yes   Weight -- 8 lb 8 lb 12 lb 10 lb   Reps -- 10-15 10-15 10-15 10-15     Interval Training   Interval Training -- No No No No     Treadmill   MPH -- 3 3 3.5 --   Grade -- $Remove'3 2 3 'OkXacOx$ --   Minutes -- $RemoveBe'15 15 15 'nNKcovgWA$ --   METs -- 4.54 4.12 5.13 --     Recumbant Bike   Level -- -- -- 4 --   Minutes -- -- -- 15 --     NuStep   Level -- 5 -- 8 --   Minutes -- 15 -- 15 --   METs -- 4.6 -- 4.5 --     REL-XR   Level -- -- -- -- 12   Minutes -- -- -- -- 15   METs -- -- -- -- 4.12     T5 Nustep   Level -- 5 6 -- --   Minutes -- 15 15 -- --   METs -- 3.1 4.3 -- --     Biostep-RELP   Level -- 3 -- 7 --   Minutes -- 15 -- 15 --   METs -- 3 -- 5 --    Row Name 08/17/20 1400 08/30/20 1100           Response to Exercise   Blood Pressure (Admit) 132/70 104/62      Blood Pressure (Exercise) 186/70 154/94      Blood Pressure (Exit) 124/56 124/62      Heart Rate (Admit) 110 bpm 73 bpm      Heart Rate (Exercise) 112 bpm 96 bpm      Heart Rate (Exit) 78 bpm 94 bpm      Rating of Perceived Exertion (Exercise) 14 15      Symptoms none none      Duration Continue with 30 min of aerobic exercise  without signs/symptoms of physical distress. Continue with 30 min of aerobic exercise without signs/symptoms of physical distress.      Intensity THRR unchanged THRR unchanged             Progression      Progression Continue to progress workloads to maintain intensity without signs/symptoms of physical distress. Continue to progress workloads to maintain intensity without signs/symptoms of physical distress.      Average METs 2.45 4.3             Resistance Training      Training Prescription Yes Yes      Weight 10 lb 10 lb      Reps 10-15 10-15             Interval Training      Interval Training Yes Yes      Comments stair steps stair steps             Treadmill      MPH 3 3      Grade 2 2      Minutes 15 15      METs 4.12 4.12             NuStep      Level 8 --      Minutes 15 --             REL-XR      Level 4 6      Minutes 15 15      METs 1.8 4.5  T5 Nustep      Level 6 --      Minutes 15 --      METs 3.9 --             Home Exercise Plan      Plans to continue exercise at -- Home (comment)      Frequency -- Add 3 additional days to program exercise sessions.      Initial Home Exercises Provided -- 08/18/20              Exercise Comments:   Exercise Comments     Row Name 05/12/20 7342 07/28/20 1058 09/13/20 0809       Exercise Comments First full day of exercise!  Patient was oriented to gym and equipment including functions, settings, policies, and procedures.  Patient's individual exercise prescription and treatment plan were reviewed.  All starting workloads were established based on the results of the 6 minute walk test done at initial orientation visit.  The plan for exercise progression was also introduced and progression will be customized based on patient's performance and goals. Pt able to follow exercise prescription today. Patient complained of weight gain over last three months. Also experienced short period of chest  discomfort of 2/10 pain. Discomfort passed and was able to continue exercising and attend education without incident. Patient's rhythm recordings and record of weights since beginning rehab were sent to Dr. Laurelyn Sickle office requesting a response from the doctor. Will continue to monitor for progression. Jake Castro graduated today from  rehab with 35 sessions completed.  Details of the patient's exercise prescription and what He needs to do in order to continue the prescription and progress were discussed with patient.  Patient was given a copy of prescription and goals.  Patient verbalized understanding.  Jake Castro plans to continue to exercise by Home exercises and has [employer] .              Exercise Goals and Review:   Exercise Goals     Row Name 05/11/20 1454             Exercise Goals   Increase Physical Activity Yes       Intervention Provide advice, education, support and counseling about physical activity/exercise needs.;Develop an individualized exercise prescription for aerobic and resistive training based on initial evaluation findings, risk stratification, comorbidities and participant's personal goals.       Expected Outcomes Short Term: Attend rehab on a regular basis to increase amount of physical activity.;Long Term: Exercising regularly at least 3-5 days a week.;Long Term: Add in home exercise to make exercise part of routine and to increase amount of physical activity.       Increase Strength and Stamina Yes       Intervention Provide advice, education, support and counseling about physical activity/exercise needs.;Develop an individualized exercise prescription for aerobic and resistive training based on initial evaluation findings, risk stratification, comorbidities and participant's personal goals.       Expected Outcomes Short Term: Increase workloads from initial exercise prescription for resistance, speed, and METs.;Short Term: Perform resistance training exercises routinely during  rehab and add in resistance training at home;Long Term: Improve cardiorespiratory fitness, muscular endurance and strength as measured by increased METs and functional capacity (6MWT)       Able to understand and use rate of perceived exertion (RPE) scale Yes       Intervention Provide education and explanation on how to use RPE scale  Expected Outcomes Short Term: Able to use RPE daily in rehab to express subjective intensity level;Long Term:  Able to use RPE to guide intensity level when exercising independently       Able to understand and use Dyspnea scale Yes       Intervention Provide education and explanation on how to use Dyspnea scale       Expected Outcomes Short Term: Able to use Dyspnea scale daily in rehab to express subjective sense of shortness of breath during exertion;Long Term: Able to use Dyspnea scale to guide intensity level when exercising independently       Knowledge and understanding of Target Heart Rate Range (THRR) Yes       Intervention Provide education and explanation of THRR including how the numbers were predicted and where they are located for reference       Expected Outcomes Short Term: Able to state/look up THRR;Short Term: Able to use daily as guideline for intensity in rehab;Long Term: Able to use THRR to govern intensity when exercising independently       Able to check pulse independently Yes       Intervention Provide education and demonstration on how to check pulse in carotid and radial arteries.;Review the importance of being able to check your own pulse for safety during independent exercise       Expected Outcomes Short Term: Able to explain why pulse checking is important during independent exercise;Long Term: Able to check pulse independently and accurately       Understanding of Exercise Prescription Yes       Intervention Provide education, explanation, and written materials on patient's individual exercise prescription       Expected Outcomes  Short Term: Able to explain program exercise prescription;Long Term: Able to explain home exercise prescription to exercise independently                Exercise Goals Re-Evaluation :  Exercise Goals Re-Evaluation     Row Name 05/12/20 0739 05/24/20 0744 06/09/20 1407 06/22/20 1527 06/30/20 0747     Exercise Goal Re-Evaluation   Exercise Goals Review Increase Physical Activity;Able to understand and use rate of perceived exertion (RPE) scale;Knowledge and understanding of Target Heart Rate Range (THRR);Understanding of Exercise Prescription;Able to understand and use Dyspnea scale;Able to check pulse independently;Increase Strength and Stamina Increase Physical Activity;Able to understand and use rate of perceived exertion (RPE) scale;Knowledge and understanding of Target Heart Rate Range (THRR);Understanding of Exercise Prescription;Increase Strength and Stamina;Able to check pulse independently;Able to understand and use Dyspnea scale -- Increase Physical Activity;Increase Strength and Stamina;Understanding of Exercise Prescription --   Comments Reviewed RPE and dyspnea scales, THR and program prescription with pt today.  Pt voiced understanding and was given a copy of goals to take home. Patient has been gradually increasing workloads on machines in class to keep levels at a challenging intenstity. He works with varitiety of speed/elevation combinations on the TM. He stated that he feels he is getting stronger and doing will with his exercise. All vital signs during exercise have been in acceptable ranges. Jake Castro has just returned after vacation where he did a lot of walking. He did increase level on T5 today.  His average MET level was 4.2.  Staff will monitor progress. Jake Castro is doing well in rehab.  He is now up to 3.5 mph on the treadmill at 3%.  He really enjoys pushing himself on the treadmill.  We will continue to monitor his progress. Out since  last review   Expected Outcomes Short: Use RPE  daily to regulate intensity. Long: Follow program prescription in THR. Short: continue to attend cardiac rehab classes consistently and make gradual progression with exercise intensity. Long: become independent with execise progam. Short:  get back to regular attendance Long:  improve overall stamina Short: Continue increase workloads Long; Continue to improve stamina --    Row Name 07/21/20 0901 07/26/20 0804 08/02/20 0943 08/17/20 1455 08/18/20 1116     Exercise Goal Re-Evaluation   Exercise Goals Review -- Increase Physical Activity;Increase Strength and Stamina;Understanding of Exercise Prescription Increase Physical Activity;Increase Strength and Stamina Increase Physical Activity;Increase Strength and Stamina;Understanding of Exercise Prescription Increase Physical Activity;Increase Strength and Stamina   Comments Out since last review.  Last day attended 4/4 due to family and then COVID + Jake Castro is back in rehab starting back last Friday.  He continues to stay active at home.  He goes to the dog park daily with his dog and walks while they play.  He does 6 laps with calesthenics.  He is feeling better overall and rebuilding back to his pre COVID stamina.  Prior to COVID, his stamina was doing much better. Jake Castro is getting back to regular attendance.  He uses 10 lb weights for strength work.  Staff will monitor progress. Jake Castro is doing well in rehab.  He likes to alternate upper body and lower body on the NuStep.  He has also done some stair step intervals, but not consistent with his intervals.  We will continue to monitor his progress. Reviewed home exercise with pt today.  Pt plans to walk and do circuit training for exercise.  Reviewed THR, pulse, RPE, sign and symptoms, pulse oximetery and when to call 911 or MD.  Also discussed weather considerations and indoor options.  Pt voiced understanding.   Expected Outcomes -- Short: Continue to return to rehab regularly Long: Continue to improve stamina. Short:  get back to consistent attendance Long:  build overall stamina and MET level Short: Use intervals and reports METs Long: Conitnue to improve stamina. Short: monitor HR when exercising Long: maintain consistent exercise    Row Name 08/23/20 0727 08/30/20 1141           Exercise Goal Re-Evaluation   Exercise Goals Review Increase Physical Activity;Increase Strength and Stamina Increase Physical Activity;Increase Strength and Stamina      Comments Patient reports that he is progressing with his exercise intensities. He feels he is physically able to do everything he wants to do in his daily life and does not have any limitations. He does report exercising at home by walking on days that he does not come to rehab. He is also working with some interval training during his rehab sessions. Jake Castro is up to level 6 on XR.  He uses 10 lb weights for strength work.  He will have post 6MWT soon and we expect him to improve      Expected Outcomes Short: continue to be consistent with attendence to rehab and increase workout intenstities as tolerated. Long: become independent with exercise program and when he has more time start going to gym at his place of employment. Short:improve post 6MWT  Long:  complete HT program               Discharge Exercise Prescription (Final Exercise Prescription Changes):  Exercise Prescription Changes - 08/30/20 1100       Response to Exercise   Blood Pressure (Admit) 104/62  Blood Pressure (Exercise) 154/94    Blood Pressure (Exit) 124/62    Heart Rate (Admit) 73 bpm    Heart Rate (Exercise) 96 bpm    Heart Rate (Exit) 94 bpm    Rating of Perceived Exertion (Exercise) 15    Symptoms none    Duration Continue with 30 min of aerobic exercise without signs/symptoms of physical distress.    Intensity THRR unchanged      Progression   Progression Continue to progress workloads to maintain intensity without signs/symptoms of physical distress.    Average METs 4.3       Resistance Training   Training Prescription Yes    Weight 10 lb    Reps 10-15      Interval Training   Interval Training Yes    Comments stair steps      Treadmill   MPH 3    Grade 2    Minutes 15    METs 4.12      REL-XR   Level 6    Minutes 15    METs 4.5      Home Exercise Plan   Plans to continue exercise at Home (comment)    Frequency Add 3 additional days to program exercise sessions.    Initial Home Exercises Provided 08/18/20             Nutrition:  Target Goals: Understanding of nutrition guidelines, daily intake of sodium '1500mg'$ , cholesterol '200mg'$ , calories 30% from fat and 7% or less from saturated fats, daily to have 5 or more servings of fruits and vegetables.  Education: All About Nutrition: -Group instruction provided by verbal, written material, interactive activities, discussions, models, and posters to present general guidelines for heart healthy nutrition including fat, fiber, MyPlate, the role of sodium in heart healthy nutrition, utilization of the nutrition label, and utilization of this knowledge for meal planning. Follow up email sent as well. Written material given at graduation. Flowsheet Row Cardiac Rehab from 09/08/2020 in [facility] Cardiac and Pulmonary Rehab  Date 07/28/20  Educator [facility]  Instruction Review Code 1- Verbalizes Understanding       Biometrics:  Pre Biometrics - 05/11/20 1456       Pre Biometrics   Height 5' 11.1" (1.806 m)    Weight 205 lb 3.2 oz (93.1 kg)    BMI (Calculated) 28.54    Single Leg Stand 9.5 seconds              Nutrition Therapy Plan and Nutrition Goals:  Nutrition Therapy & Goals - 08/23/20 1100       Nutrition Therapy   Diet Heart healthy, low Na, diabetes friendly eating    Drug/Food Interactions Statins/Certain Fruits    Protein (specify units) 80-85g    Fiber 30 grams    Whole Grain Foods 3 servings    Saturated Fats 12 max. grams    Fruits and Vegetables 8 servings/day    Sodium 1.5  grams      Personal Nutrition Goals   Nutrition Goal ST: honor hunger, eating consistently with consistent CHOs and following MyPlate, add fruit, spinach, and nut butter to protein shake to make it more of a meal.  LT: maintain sustainable eating pattern    Comments BG varies between 90-100-120-150. Jake Castro reports not eating much at all for some weeks now. 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 He reports having lost a lot of weight before  rehab getiing under 200lbs (198lbs) he is now 240lbs. This could be rebound weight we see from dieting, but the causes could be multifactoral. Re-addressed his BMR 1846kcal ([place]) and energy needs. Discussed honoring hunger and hunger cues. He had no questions regaridng general heart healthy eating, reviewed diabetes friendly eating and eating for a healhty microbiome (his request). He wanted to know more about plant based eating - discussed nutrition considerations. He likes eating carrots with poppyseed dressing when not hungry, but wanting to eat.      Intervention Plan   Intervention Prescribe, educate and counsel regarding individualized specific dietary modifications aiming towards targeted core components such as weight, hypertension, lipid management, diabetes, heart failure and other comorbidities.;Nutrition handout(s) given to patient.    Expected Outcomes Short Term Goal: Understand basic principles of dietary content, such as calories, fat, sodium, cholesterol and nutrients.;Short Term Goal: A plan has been developed with personal nutrition goals set during dietitian appointment.;Long Term Goal: Adherence to prescribed nutrition plan.             Nutrition Assessments:  MEDIFICTS Score Key: ?70 Need to make dietary changes  40-70 Heart Healthy Diet ? 40 Therapeutic Level Cholesterol Diet  Flowsheet Row Cardiac Rehab from 05/11/2020 in [facility] Cardiac  and Pulmonary Rehab  Picture Your Plate Total Score on Admission 53      Picture Your Plate Scores: <81 Unhealthy dietary pattern with much room for improvement. 41-50 Dietary pattern unlikely to meet recommendations for good health and room for improvement. 51-60 More healthful dietary pattern, with some room for improvement.  >60 Healthy dietary pattern, although there may be some specific behaviors that could be improved.    Nutrition Goals Re-Evaluation:  Nutrition Goals Re-Evaluation     Row Name 05/24/20 0749 07/26/20 0810           Goals   Nutrition Goal -- Meet with dietician and work on weight loss      Comment Has not yet met with program dietician. Appointment will be scheduled with program dietician in his upcoming visits. Jake Castro still has not met with program dieticain as he was out most of last month with family and COVID.  He has gained almost 14 lbs in the last month while he was out.  He seems to be missing his hunger cues as he states that he will eat a meal and then about 10-15 min later will eat again.  We talked about trying to curb his appetite with gum or mint after a meal.  Also talked about listening to his hunger cures and not just eating constantly.  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      Expected Outcome Short: meet with program dietician to set nutrician goals. Long: follow goals set by patient and program dieticia Short: Meet with dietician Long: Work on hunger cues               Nutrition Goals Discharge (Final Nutrition Goals Re-Evaluation):  Nutrition Goals Re-Evaluation - 07/26/20 0810       Goals   Nutrition Goal Meet with dietician and work on weight loss    Comment Jake Castro still has not met with program dieticain as he was out most of last month with family and COVID.  He has gained almost 14 lbs in the last month while he was out.  He seems to be missing his hunger cues as he states that he will eat  a meal and  then about 10-15 min later will eat again.  We talked about trying to curb his appetite with gum or mint after a meal.  Also talked about listening to his hunger cures and not just eating constantly.  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    Expected Outcome Short: Meet with dietician Long: Work on hunger cues             Psychosocial: Target Goals: Acknowledge presence or absence of significant depression and/or stress, maximize coping skills, provide positive support system. Participant is able to verbalize types and ability to use techniques and skills needed for reducing stress and depression.   Education: Stress, Anxiety, and Depression - Group verbal and visual presentation to define topics covered.  Reviews how body is impacted by stress, anxiety, and depression.  Also discusses healthy ways to reduce stress and to treat/manage anxiety and depression.  Written material given at graduation.   Education: Sleep Hygiene -Provides group verbal and written instruction about how sleep can affect your health.  Define sleep hygiene, discuss sleep cycles and impact of sleep habits. Review good sleep hygiene tips.    Initial Review & Psychosocial Screening:  Initial Psych Review & Screening - 05/04/20 0849       Initial Review   Current issues with None Identified      Family Dynamics   Good Support System? Yes    Comments He can look to his wife, his friends Jake Castro and his kids sometimes. His family cares for him but he would rather support them. Dung has a good outlook on his health.      Barriers   Psychosocial barriers to participate in program The patient should benefit from training in stress management and relaxation.;There are no identifiable barriers or psychosocial needs.      Screening Interventions   Interventions Encouraged to exercise;To provide support and resources with identified psychosocial needs;Provide feedback about the scores  to participant    Expected Outcomes Short Term goal: Utilizing psychosocial counselor, staff and physician to assist with identification of specific Stressors or current issues interfering with healing process. Setting desired goal for each stressor or current issue identified.;Long Term Goal: Stressors or current issues are controlled or eliminated.;Short Term goal: Identification and review with participant of any Quality of Life or Depression concerns found by scoring the questionnaire.;Long Term goal: The participant improves quality of Life and PHQ9 Scores as seen by post scores and/or verbalization of changes             Quality of Life Scores:   Quality of Life - 05/11/20 1456       Quality of Life   Select Quality of Life      Quality of Life Scores   Health/Function Pre 18.6 %    Socioeconomic Pre 23.44 %    Psych/Spiritual Pre 23.93 %    Family Pre 28.8 %    GLOBAL Pre 22.23 %            Scores of 19 and below usually indicate a poorer quality of life in these areas.  A difference of  2-3 points is a clinically meaningful difference.  A difference of 2-3 points in the total score of the Quality of Life Index has been associated with significant improvement in overall quality of life, self-image, physical symptoms, and general health in studies assessing change in quality of life.  PHQ-9: Recent Review Flowsheet  Data     Depression screen [facility] 2/9 05/11/2020 09/10/2014   Decreased Interest 0 0   Down, Depressed, Hopeless 0 0   PHQ - 2 Score 0 0   Altered sleeping 3 0   Tired, decreased energy 1 0   Change in appetite 1 0   Feeling bad or failure about yourself  0 0   Trouble concentrating 0 0   Moving slowly or fidgety/restless 0 0   Suicidal thoughts 0 0   PHQ-9 Score 5 0   Difficult doing work/chores Somewhat difficult -      Interpretation of Total Score  Total Score Depression Severity:  1-4 = Minimal depression, 5-9 = Mild depression, 10-14 = Moderate  depression, 15-19 = Moderately severe depression, 20-27 = Severe depression   Psychosocial Evaluation and Intervention:  Psychosocial Evaluation - 05/04/20 0852       Psychosocial Evaluation & Interventions   Interventions Encouraged to exercise with the program and follow exercise prescription;Stress management education;Relaxation education    Comments He can look to his wife, his friends Dorene Sorrow and his kids sometimes. His family cares for him but he would rather support them. Miner has a good outlook on his health.    Expected Outcomes Short: Exercise regularly to support mental health and notify staff of any changes. Long: maintain mental health and well being through teaching of rehab or prescribed medications independently.    Continue Psychosocial Services  Follow up required by staff             Psychosocial Re-Evaluation:  Psychosocial Re-Evaluation     Row Name 05/24/20 [phone] 07/26/20 0807 08/23/20 0736         Psychosocial Re-Evaluation   Current issues with -- Current Stress Concerns Current Stress Concerns     Comments Patient stated that he had no new stress concerns and is sleeping better currently then he was. Jake Castro is doing well mentally. 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  He is also finishing up classes this week and looking forward to his summer break.  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  He feels that he is doing well with it.  He sleeps well most nights.  He is concerned over his weight gain and headaches he has been having since COVID. He was encouraged to talk to doctor about both of these. Jake Castro reports no new sleep or stress concerns. He does have a big project at work that he is working on and is very involved, but feels like he sleeps well and is able to handle the stress in his life. He does have a doctor  appointment today to discuss his weight concerns.     Expected Outcomes Short: continue to  maintain healthy sleeping patterns. Long: Maintain good metal health with positive sleep and stress management habits. Short; Talk to doctor about weight and headaches Long: Continue to work on book proposal Short; Talk to doctor about weight and headaches Long: Continue to work on book proposal     Interventions Encouraged to attend Cardiac Rehabilitation for the exercise Encouraged to attend Cardiac Rehabilitation for the exercise Encouraged to attend Cardiac Rehabilitation for the exercise     Continue Psychosocial Services  Follow up required by staff Follow up required by staff Follow up required by staff              Psychosocial Discharge (Final Psychosocial Re-Evaluation):  Psychosocial Re-Evaluation - 08/23/20 0736       Psychosocial Re-Evaluation   Current issues with Current Stress Concerns    Comments Jake Castro reports no new sleep or stress concerns. He does have a big project at work that he is working on and is very involved, but feels like he sleeps well and is able to handle the stress in his life. He does have a doctor  appointment today to discuss his weight concerns.    Expected Outcomes Short; Talk to doctor about weight and headaches Long: Continue to work on book proposal    Interventions Encouraged to attend Cardiac Rehabilitation for the exercise    Continue Psychosocial Services  Follow up required by staff             Vocational Rehabilitation: Provide vocational rehab assistance to qualifying candidates.   Vocational Rehab Evaluation & Intervention:   Education: Education Goals: Education classes will be provided on a variety of topics geared toward better understanding of heart health and risk factor modification. Participant will state understanding/return demonstration of topics presented as noted by education test scores.  Learning Barriers/Preferences:  Learning Barriers/Preferences - 05/04/20 0848       Learning Barriers/Preferences   Learning  Barriers None    Learning Preferences None             General Cardiac Education Topics:  AED/CPR: - Group verbal and written instruction with the use of models to demonstrate the basic use of the AED with the basic ABC's of resuscitation.   Anatomy and Cardiac Procedures: - Group verbal and visual presentation and models provide information about basic cardiac anatomy and function. Reviews the testing methods done to diagnose heart disease and the outcomes of the test results. Describes the treatment choices: Medical Management, Angioplasty, or Coronary Bypass Surgery for treating various heart conditions including Myocardial Infarction, Angina, Valve Disease, and Cardiac Arrhythmias.  Written material given at graduation. Flowsheet Row Cardiac Rehab from 09/08/2020 in [facility] Cardiac and Pulmonary Rehab  Education need identified 05/11/20  Date 05/12/20  Educator SB  Instruction Review Code 1- Verbalizes Understanding       Medication Safety: - Group verbal and visual instruction to review commonly prescribed medications for heart and lung disease. Reviews the medication, class of the drug, and Castro effects. Includes the steps to properly store meds and maintain the prescription regimen.  Written material given at graduation. Flowsheet Row Cardiac Rehab from 09/08/2020 in [facility] Cardiac and Pulmonary Rehab  Date 08/04/20  Educator SB  Instruction Review Code 1- Verbalizes Understanding       Intimacy: - Group verbal instruction through game format to discuss how heart and lung disease can affect sexual intimacy. Written material given at graduation.. Flowsheet Row Cardiac Rehab from 09/08/2020 in [facility] Cardiac and Pulmonary Rehab  Date 09/08/20  Educator [facility]  Instruction Review Code (retired) 1- partially meets, needs review/practice       Know Your Numbers and Heart Failure: - Group verbal and visual instruction to discuss disease risk factors for cardiac and pulmonary disease  and treatment options.  Reviews associated critical values for Overweight/Obesity, Hypertension, Cholesterol, and Diabetes.  Discusses basics of heart failure: signs/symptoms and treatments.  Introduces Heart Failure Zone chart for action plan for heart failure.  Written material given at graduation.   Infection Prevention: - Provides verbal and written material to individual with discussion of infection control including proper hand washing and proper equipment cleaning during exercise session. Flowsheet Row Cardiac Rehab from  09/08/2020 in [facility] Cardiac and Pulmonary Rehab  Date 05/04/20  Educator [facility]  Instruction Review Code 1- Verbalizes Understanding       Falls Prevention: - Provides verbal and written material to individual with discussion of falls prevention and safety. Flowsheet Row Cardiac Rehab from 09/08/2020 in [facility] Cardiac and Pulmonary Rehab  Date 05/04/20  Educator [facility]  Instruction Review Code 1- Verbalizes Understanding       Other: -Provides group and verbal instruction on various topics (see comments)   Knowledge Questionnaire Score:  Knowledge Questionnaire Score - 05/11/20 1458       Knowledge Questionnaire Score   Pre Score 24/26 Education Focus: PAD, Exercise             Core Components/Risk Factors/Patient Goals at Admission:  Personal Goals and Risk Factors at Admission - 05/11/20 1458       Core Components/Risk Factors/Patient Goals on Admission    Weight Management Yes;Weight Loss;Obesity    Intervention Weight Management: Develop a combined nutrition and exercise program designed to reach desired caloric intake, while maintaining appropriate intake of nutrient and fiber, sodium and fats, and appropriate energy expenditure required for the weight goal.;Weight Management: Provide education and appropriate resources to help participant work on and attain dietary goals.;Weight Management/Obesity: Establish reasonable short term and long term weight  goals.;Obesity: Provide education and appropriate resources to help participant work on and attain dietary goals.    Admit Weight 205 lb 3.2 oz (93.1 kg)    Goal Weight: Short Term 200 lb (90.7 kg)    Goal Weight: Long Term 195 lb (88.5 kg)    Expected Outcomes Short Term: Continue to assess and modify interventions until short term weight is achieved;Long Term: Adherence to nutrition and physical activity/exercise program aimed toward attainment of established weight goal;Weight Loss: Understanding of general recommendations for a balanced deficit meal plan, which promotes 1-2 lb weight loss per week and includes a negative energy balance of [phone] kcal/d;Understanding recommendations for meals to include 15-35% energy as protein, 25-35% energy from fat, 35-60% energy from carbohydrates, less than $RemoveB'200mg'MDmPdpWR$  of dietary cholesterol, 20-35 gm of total fiber daily;Understanding of distribution of calorie intake throughout the day with the consumption of 4-5 meals/snacks    Diabetes Yes    Intervention Provide education about signs/symptoms and action to take for hypo/hyperglycemia.;Provide education about proper nutrition, including hydration, and aerobic/resistive exercise prescription along with prescribed medications to achieve blood glucose in normal ranges: Fasting glucose 65-99 mg/dL    Expected Outcomes Short Term: Participant verbalizes understanding of the signs/symptoms and immediate care of hyper/hypoglycemia, proper foot care and importance of medication, aerobic/resistive exercise and nutrition plan for blood glucose control.;Long Term: Attainment of HbA1C < 7%.    Hypertension Yes    Intervention Provide education on lifestyle modifcations including regular physical activity/exercise, weight management, moderate sodium restriction and increased consumption of fresh fruit, vegetables, and low fat dairy, alcohol moderation, and smoking cessation.;Monitor prescription use compliance.    Expected  Outcomes Short Term: Continued assessment and intervention until BP is < 140/86mm HG in hypertensive participants. < 130/93mm HG in hypertensive participants with diabetes, heart failure or chronic kidney disease.;Long Term: Maintenance of blood pressure at goal levels.    Lipids Yes    Intervention Provide education and support for participant on nutrition & aerobic/resistive exercise along with prescribed medications to achieve LDL '70mg'$ , HDL >$Remo'40mg'ZZeSF$ .    Expected Outcomes Short Term: Participant states understanding of desired cholesterol values and is compliant with medications prescribed. Participant  is following exercise prescription and nutrition guidelines.;Long Term: Cholesterol controlled with medications as prescribed, with individualized exercise RX and with personalized nutrition plan. Value goals: LDL < $Rem'70mg'gjcj$ , HDL > 40 mg.             Education:Diabetes - Individual verbal and written instruction to review signs/symptoms of diabetes, desired ranges of glucose level fasting, after meals and with exercise. Acknowledge that pre and post exercise glucose checks will be done for 3 sessions at entry of program. [facility] from 09/08/2020 in [facility] Cardiac and Pulmonary Rehab  Date 05/04/20  Educator [facility]  Instruction Review Code 1- Verbalizes Understanding       Core Components/Risk Factors/Patient Goals Review:   Goals and Risk Factor Review     Row Name 05/24/20 0739 07/26/20 0813 08/23/20 0732         Core Components/Risk Factors/Patient Goals Review   Personal Goals Review Weight Management/Obesity;Hypertension;Lipids;Diabetes Weight Management/Obesity;Hypertension;Lipids;Diabetes Weight Management/Obesity;Hypertension;Lipids;Diabetes     Review Patient reports taking all medications as prescribed. He monitors blood sugars at home and reports numbers in acceptable ranges. His weight has increased slightly and he expressed interest in intermittent fasting. He was  encouraged to be careful with his blood sugar levels and monitor them closely if he chooses to do this. He was not yet met with program dietician but will be scheduled for that in his upcoming visitis. Jake Castro's weight has greatly increased over the last month, almost 14 lbs.  He is eating more and not following hunger cues then feels distended and overly full after eating two meals close together.  We talked about using gum and/or mints to help as well.  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  His sugars are doing okay as far as he feels.  His blood pressures have been good in class but he does not check them at home frequently.  He has been complaining of frequent headaches since COVID, but not check BP.  He was encouraged to check his BP and to contact his doctor about headaches and weight gain.  He does not think weight is fluid as he has been eating.  He does not seem to be getting enough water either as he never has been good about drinking enough. Jake Castro reports taking all medications as prescribed. He reports that his weight has steadied over the past few days but he is still concerned about the weight he has gained. He has a doctor's appointment today and is planning to talk to his doctor about his concern.     Expected Outcomes Short: continue to attend rehab consistently and take all medications. Long: control risk factors with heart healthy lifestyle choices. Short: Call doctor about headaches and weight gain Long: Continue to monitor his risk factors. Short: Meet with doctor and discuss weight gain concerns. Long: continue to montior risk factors.              Core Components/Risk Factors/Patient Goals at Discharge (Final Review):   Goals and Risk Factor Review - 08/23/20 0732       Core Components/Risk Factors/Patient Goals Review   Personal Goals Review Weight Management/Obesity;Hypertension;Lipids;Diabetes    Review Jake Castro reports taking all medications as prescribed. He reports that  his weight has steadied over the past few days but he is still concerned about the weight he has gained. He has a doctor's appointment today and is planning to talk to his doctor about his concern.    Expected Outcomes Short:  Meet with doctor and discuss weight gain concerns. Long: continue to montior risk factors.             ITP Comments:  ITP Comments     Row Name 05/04/20 0854 05/11/20 1154 05/12/20 0653 05/12/20 0739 06/09/20 0959   ITP Comments Virtual Visit completed. Patient informed on EP and RD appointment and 6 Minute walk test. Patient also informed of patient health questionnaires on My Chart. Patient Verbalizes understanding. Visit diagnosis can be found in [facility] 04/21/2020. Completed 6MWT and gym orientation. Initial ITP created and sent for review to Dr. Emily Filbert, Medical Director. 30 Day review completed. Medical Director ITP review done, changes made as directed, and signed approval by Medical Director.  New to program First full day of exercise!  Patient was oriented to gym and equipment including functions, settings, policies, and procedures.  Patient's individual exercise prescription and treatment plan were reviewed.  All starting workloads were established based on the results of the 6 minute walk test done at initial orientation visit.  The plan for exercise progression was also introduced and progression will be customized based on patient's performance and goals. 30 Day review completed. Medical Director ITP review done, changes made as directed, and signed approval by Medical Director.    Row Name 06/30/20 [phone] 07/07/20 9326 07/07/20 0752 07/21/20 0901 07/23/20 0900   ITP Comments Patient called out today.  He had been out previously as he was sick.  Now he is out of town taking care of his brother who is coming home from the hospital in [place].  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 30 Day review completed. Medical Director ITP  review done, changes made as directed, and signed approval by Medical Director. Jake Castro has not attended since last review due to illness and taking care of family member. Out due to COVID return on 5/6 Jake Castro returned today after being out with COVID.  He was encouraged to builld back slowly.  He noted increased fatigue today.  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    [place] Name 07/28/20 1058 08/04/20 0739 08/10/20 1038 08/23/20 1100 09/01/20 0839   ITP Comments Pt able to follow exercise prescription today. Patient complained of weight gain over last three months. Also experienced short period of chest discomfort of 2/10 pain. Discomfort passed and was able to continue exercising and attend education without incident. Patient's rhythm recordings and record of weights since beginning rehab were sent to Dr. Laurelyn Sickle office requesting a response from the doctor. Will continue to monitor for progression. 30 Day review completed. Medical Director ITP review done, changes made as directed, and signed approval by Medical Director. Unable to complete initial consultation for nutrition as patient was absent for various reasons vacations and family emergency as well as COVID, patient has also missed multiple nutrition appointments - will continue to attempt to complete. Completed initial nutrition consultation 30 Day review completed. Medical Director ITP review done, changes made as directed, and signed approval by Medical Director.    Row Name 09/13/20 0808           ITP Comments Jake Castro graduated today from  rehab with 35 sessions completed.  Details of the patient's exercise prescription and what He needs to do in order to continue the prescription and progress were discussed with patient.  Patient was given a copy of prescription and goals.  Patient verbalized understanding.  Jake Castro plans to continue to exercise by Home exercises and has [employer] .                Comments:  Discharge ITP

## 2020-11-02 ENCOUNTER — Encounter: Payer: Self-pay | Admitting: Cardiovascular Disease

## 2020-11-02 ENCOUNTER — Other Ambulatory Visit: Payer: Self-pay

## 2020-11-02 ENCOUNTER — Ambulatory Visit: Payer: Medicare PPO | Admitting: Cardiovascular Disease

## 2020-11-02 VITALS — BP 138/74 | HR 74 | Ht 71.0 in | Wt 244.0 lb

## 2020-11-02 DIAGNOSIS — I6523 Occlusion and stenosis of bilateral carotid arteries: Secondary | ICD-10-CM

## 2020-11-02 DIAGNOSIS — I739 Peripheral vascular disease, unspecified: Secondary | ICD-10-CM | POA: Diagnosis not present

## 2020-11-02 DIAGNOSIS — E118 Type 2 diabetes mellitus with unspecified complications: Secondary | ICD-10-CM | POA: Diagnosis not present

## 2020-11-02 DIAGNOSIS — Z951 Presence of aortocoronary bypass graft: Secondary | ICD-10-CM

## 2020-11-02 DIAGNOSIS — I25118 Atherosclerotic heart disease of native coronary artery with other forms of angina pectoris: Secondary | ICD-10-CM | POA: Diagnosis not present

## 2020-11-02 DIAGNOSIS — E782 Mixed hyperlipidemia: Secondary | ICD-10-CM

## 2020-11-02 DIAGNOSIS — I10 Essential (primary) hypertension: Secondary | ICD-10-CM

## 2020-11-02 NOTE — Patient Instructions (Addendum)
Medication Instructions:  No changes  If you need a refill on your cardiac medications before your next appointment, please call your pharmacy.   Lab work: No new labs needed  Testing/Procedures: No new testing needed   Follow-Up: At [facility], you and your health needs are our priority.  As part of our continuing mission to provide you with exceptional heart care, we have created designated Provider Care Teams.  These Care Teams include your primary Cardiologist (physician) and Advanced Practice Providers (APPs -  Physician Assistants and Nurse Practitioners) who all work together to provide you with the care you need, when you need it.  You will need a follow up appointment in 6 months  Providers on your designated Care Team:   Christopher Berge, NP Ryan Dunn, PA-C Jacquelyn Visser, PA-C Cadence Furth, PA-C   COVID-19 Vaccine Information can be found at: [email or web address] For questions related to vaccine distribution or appointments, please email [email or web address] or call [phone].    

## 2020-11-02 NOTE — Progress Notes (Signed)
Cardiology Office Note  Date:  11/02/2020   ID:  Jake Castro, DOB 04/17/1950, MRN [id number]  PCP:  Juluis Pitch, MD   Chief Complaint  Patient presents with   New Patient (Initial Visit)    Ref by Dr. Lovie Macadamia to establish care for CAD/CABG x 3 in 2015 [facility] hospital. Medications reviewed by the patient verbally.     HPI:  [name] is a 70 year old gentleman with past medical history of Diabetes Hyperlipidemia Hypertension Remote smoking history PAD, carotid stenosis, CEA on the left Coronary artery disease, prior stent to proximal RCA 2008 before CABG History of CABG Catheterization from Feb 2022 occluded vein graft to the RCA, occluded vein graft to the diagonal, patent vein graft to OM Severe distal left main, ostial LAD and mid LAD disease estimated 70 to 80% Who presents to establish care for his underlying coronary artery disease  In general reports doing well, active, no anginal symptoms If walking after eating, gets SOB Likes to walk be dogs to dog park No angina walking the dogs  Prior history Non smoker, quit age 6 A1C linked to weight, typically in the 6 range Weight 200 to 240 Currently 244.  Prior weight up to 300 pounds  In hospital 04/2020, records reviewed  NSTEMI (non-ST elevated myocardial infarction) ([place])  Acute encephalopathy  AKI (acute kidney injury) ([place])  Lab work reviewed A1c 6.7 Total cholesterol 121, LDL 46  EKG personally reviewed by myself on todays visit NSR rate 68 RBBB old inferior MI  PMH:   has a past medical history of CAD (coronary artery disease), Carotid arterial disease ([place]), Diabetes mellitus ([place]), HTN (hypertension), Hyperlipidemia, Hypertension, Morbid obesity ([place]), Myocardial infarction ([place]), Neuromuscular disorder ([place]), Neuropathy, Plantar fasciitis, Pneumonia, and Wears dentures.  PSH:    Past Surgical History:  Procedure Laterality Date   CAROTID ENDARTERECTOMY     CATARACT EXTRACTION W/PHACO  Right 11/10/2015   Procedure: CATARACT EXTRACTION PHACO AND INTRAOCULAR LENS PLACEMENT (IOC);  Surgeon: Leandrew Koyanagi, MD;  Location: [facility];  Service: Ophthalmology;  Laterality: Right;  DIABETIC-oral meds RESTOR TORIC MULTIFOCAL LENS   CATARACT EXTRACTION W/PHACO Left 01/06/2016   Procedure: CATARACT EXTRACTION PHACO AND INTRAOCULAR LENS PLACEMENT ([place]);  Surgeon: Leandrew Koyanagi, MD;  Location: [facility];  Service: Ophthalmology;  Laterality: Left;  Lot# [id number] H US:01:12.1 AP%: 18.5 CDE: 13.36   CORONARY ANGIOPLASTY     CORONARY ARTERY BYPASS GRAFT N/A 01/03/2014   Procedure: CORONARY ARTERY BYPASS GRAFTING (CABG) x four, using left internal mammary and right leg greater saphenous vein harvested endoscopically;  Surgeon: Ivin Poot, MD;  Location: [facility];  Service: Open Heart Surgery;  Laterality: N/A;   FRACTURE SURGERY     ankle surgery   LEFT HEART CATH AND CORS/GRAFTS ANGIOGRAPHY N/A 04/22/2020   Procedure: LEFT HEART CATH AND CORS/GRAFTS ANGIOGRAPHY;  Surgeon: Dionisio [name], MD;  Location: [facility];  Service: Cardiovascular;  Laterality: N/A;   REMOVE AND REPLACE LENS Right 03/01/2016   Procedure: REMOVE AND REPLACE LENS;  Surgeon: Leandrew Koyanagi, MD;  Location: [facility];  Service: Ophthalmology;  Laterality: Right;   TEE WITHOUT CARDIOVERSION N/A 01/03/2014   Procedure: TRANSESOPHAGEAL ECHOCARDIOGRAM (TEE);  Surgeon: Ivin Poot, MD;  Location: [facility];  Service: Open Heart Surgery;  Laterality: N/A;   TONSILLECTOMY      Current Outpatient Medications  Medication Sig Dispense Refill   Blood Glucose Monitoring Suppl ([place]) w/Device KIT U UTD TO TEST BLOOD SUGAR  carvedilol (COREG) 3.125 MG tablet Take 1 tablet by mouth 2 (two) times daily.     chlorhexidine (PERIDEX) 0.12 % solution chlorhexidine gluconate 0.12 % mouthwash  RINSE AND SPIT WITH 1/2 OZ FOR 30 SECONDS BID FOR 2 WEEKS     Cholecalciferol  (VITAMIN D-3) 5000 UNITS TABS Take 5,000 mg by mouth daily.     fenofibrate (TRICOR) 145 MG tablet Take 145 mg by mouth daily.     isosorbide mononitrate (IMDUR) 30 MG 24 hr tablet Take 1 tablet (30 mg total) by mouth daily. 30 tablet 0   l-methylfolate-B6-B12 (METANX) 3-35-2 MG TABS tablet Take 1 tablet by mouth daily. am     lisinopril (PRINIVIL,ZESTRIL) 40 MG tablet Take 40 mg by mouth daily. am     metFORMIN (GLUCOPHAGE) 1000 MG tablet Take 1,000 mg by mouth 2 (two) times daily with a meal. Am and bedtime     Multiple Vitamin (MULTIVITAMIN) capsule Take 1 capsule by mouth daily.     Omega-3 Fatty Acids (FISH OIL) 1000 MG CAPS omega-3 acid ethyl esters 1 gram capsule     rosuvastatin (CRESTOR) 40 MG tablet Take 1 tablet (40 mg total) by mouth daily. 30 tablet 0   Semaglutide,0.25 or 0.5MG /DOS, (OZEMPIC, 0.25 OR 0.5 MG/DOSE,) 2 MG/1.5ML SOPN Ozempic 0.25 mg or 0.5 mg (2 mg/1.5 mL) subcutaneous pen injector     ticagrelor (BRILINTA) 90 MG TABS tablet Take 1 tablet (90 mg total) by mouth 2 (two) times daily. 60 tablet 0   acetaminophen (TYLENOL) 325 MG tablet Take 2 tablets (650 mg total) by mouth every 6 (six) hours as needed for mild pain (or Fever >/= 101). (Patient not taking: No sig reported)     Exenatide ER 2 MG SRER Inject into the skin once a week. (Patient not taking: No sig reported)     glucose blood (ONETOUCH ULTRA) test strip U UTD TO TEST BLOOD SUGAR (Patient not taking: No sig reported)     glucose blood (PRECISION QID TEST) test strip U UTD TO TEST BLOOD SUGAR (Patient not taking: No sig reported)     Methylcobalamin 1 MG CHEW Chew by mouth. (Patient not taking: No sig reported)     Ubiquinol 200 MG CAPS Take by mouth daily. (Patient not taking: No sig reported)     No current facility-administered medications for this visit.   Facility-Administered Medications Ordered in Other Visits  Medication Dose Route Frequency Provider Last Rate Last Admin   sodium chloride flush (NS)  0.9 % injection 3 mL  3 mL Intravenous Q12H Dionisio [name], MD         Allergies:   Kiwi extract   Social History:  The patient  reports that he quit smoking about 55 years ago. His smoking use included cigarettes. He has a 0.50 pack-year smoking history. He has never used smokeless tobacco. He reports that he does not drink alcohol and does not use drugs.   Family History:   family history includes COPD in his mother; Peripheral vascular disease in his brother and father; Stroke (age of onset: 43) in his brother.    Review of Systems: Review of Systems  Constitutional: Negative.   HENT: Negative.    Respiratory: Negative.    Cardiovascular: Negative.   Gastrointestinal: Negative.   Musculoskeletal: Negative.   Neurological: Negative.   Psychiatric/Behavioral: Negative.    All other systems reviewed and are negative.   PHYSICAL EXAM: VS:  BP 138/74 (BP Location: Right Arm, Patient Position: Sitting,  Cuff Size: Normal)   Pulse 74   Ht $R'5\' 11"'vv$  (1.803 m)   Wt 244 lb (110.7 kg)   SpO2 98%   BMI 34.03 kg/m  , BMI Body mass index is 34.03 kg/m. GEN: Well nourished, well developed, in no acute distress HEENT: normal Neck: no JVD, carotid bruits, or masses Cardiac: RRR; no murmurs, rubs, or gallops,no edema  Respiratory:  clear to auscultation bilaterally, normal work of breathing GI: soft, nontender, nondistended, + BS MS: no deformity or atrophy Skin: warm and dry, no rash Neuro:  Strength and sensation are intact Psych: euthymic mood, full affect   Recent Labs: 04/21/2020: ALT 17; B Natriuretic Peptide 87.3; Magnesium 1.9 04/23/2020: BUN 13; Creatinine, Ser 1.06; Hemoglobin 10.3; Platelets 356; Potassium 3.4; Sodium 141    Lipid Panel Lab Results  Component Value Date   CHOL 121 04/22/2020   HDL 46 04/22/2020   LDLCALC 46 04/22/2020   TRIG 146 04/22/2020      Wt Readings from Last 3 Encounters:  11/02/20 244 lb (110.7 kg)  05/11/20 205 lb 3.2 oz (93.1 kg)   04/23/20 207 lb 8 oz (94.1 kg)      ASSESSMENT AND PLAN:  Problem List Items Addressed This Visit   None Visit Diagnoses     Coronary artery disease of native artery of native heart with stable angina pectoris ([place])    -  Primary   Relevant Medications   carvedilol (COREG) 3.125 MG tablet   Other Relevant Orders   EKG 12-Lead   Diabetes mellitus type 2 with complications (HCC)       Relevant Orders   EKG 12-Lead   PAD (peripheral artery disease) (HCC)       Relevant Medications   carvedilol (COREG) 3.125 MG tablet   Mixed hyperlipidemia       Relevant Medications   carvedilol (COREG) 3.125 MG tablet   Essential hypertension       Relevant Medications   carvedilol (COREG) 3.125 MG tablet   Hx of CABG       Bilateral carotid artery stenosis       Relevant Medications   carvedilol (COREG) 3.125 MG tablet      CAD with stable angina Currently with no symptoms of angina. No further workup at this time. Continue current medication regimen. Suggested more gym workout at [facility] for maintenance  Hyperlipidemia Cholesterol is at goal on the current lipid regimen. No changes to the medications were made.  Diabetes type 2 with complications We have encouraged continued exercise, careful diet management in an effort to lose weight.  A1c in the 6 range  Peripheral arterial disease Prior carotid endarterectomy on the left Will need periodic ultrasound Risk factor profile at goal   Total encounter time more than 60 minutes  Greater than 50% was spent in counseling and coordination of care with the patient    Signed, Esmond Plants, M.D., Ph.D. [place], [place]

## 2021-05-10 ENCOUNTER — Ambulatory Visit: Payer: Medicare PPO | Admitting: Cardiovascular Disease

## 2021-05-22 NOTE — Progress Notes (Signed)
Cardiology Office Note  Date:  05/23/2021   ID:  Jake Castro, DOB 04-23-1950, MRN [id number]  PCP:  Juluis Pitch, MD   Chief Complaint  Patient presents with   6 month follow up     Patient c/o chest tightness and shortness of breath. Medications reviewed by the patient verbally.     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, teaching, publishing a book, also teaches meditation Has not been active, very sedentary [address] [address] up to 77 Feels deconditioned  Has a bike, but does not use it much Has dog to walk, has not been doing many walks  Trying to get sugars down Glu 185-190  No angina, Sits "a lot"  Feels his blood pressure is consistently elevated  Sometimes misses doses of his medications EKG personally reviewed by myself on todays visit Normal sinus rhythm rate 80 bpm right bundle branch block consider old inferior MI  Other past medical history reviewed Prior history Non smoker, quit age 12 A1C linked to weight, typically in the 6 range  In hospital 04/2020, records reviewed  NSTEMI (non-ST elevated myocardial infarction) ([place])  Acute encephalopathy  AKI (acute kidney injury) ([place])  Lab work reviewed A1c 6.7 Total cholesterol 121, LDL 46  PMH:   has a past medical history of CAD (coronary artery disease), Carotid arterial disease ([place]), Diabetes mellitus ([place]), HTN (hypertension), Hyperlipidemia, Hypertension, Morbid  obesity ([place]), Myocardial infarction ([place]), Neuromuscular disorder ([place]), Neuropathy, Plantar fasciitis, Pneumonia, and Wears dentures.  PSH:    Past Surgical History:  Procedure Laterality Date   CAROTID ENDARTERECTOMY     CATARACT EXTRACTION W/PHACO Right 11/10/2015   Procedure: CATARACT EXTRACTION PHACO AND INTRAOCULAR LENS PLACEMENT (IOC);  Surgeon: Leandrew Koyanagi, MD;  Location: [place];  Service: Ophthalmology;  Laterality: Right;  DIABETIC-oral meds RESTOR TORIC MULTIFOCAL LENS   CATARACT EXTRACTION W/PHACO Left 01/06/2016   Procedure: CATARACT EXTRACTION PHACO AND INTRAOCULAR LENS PLACEMENT ([place]);  Surgeon: Leandrew Koyanagi, MD;  Location: [facility];  Service: Ophthalmology;  Laterality: Left;  Lot# [id number] H US:01:12.1 AP%: 18.5 CDE: 13.36   CORONARY ANGIOPLASTY     CORONARY ARTERY BYPASS GRAFT N/A 01/03/2014   Procedure: CORONARY ARTERY BYPASS GRAFTING (CABG) x four, using left internal mammary and right leg greater saphenous vein harvested endoscopically;  Surgeon: Ivin Poot, MD;  Location: [place];  Service: Open Heart Surgery;  Laterality: N/A;   FRACTURE SURGERY     ankle surgery   LEFT HEART CATH AND CORS/GRAFTS ANGIOGRAPHY N/A 04/22/2020   Procedure: LEFT HEART CATH AND CORS/GRAFTS ANGIOGRAPHY;  Surgeon: Dionisio [name], MD;  Location: [place] [facility];  Service: Cardiovascular;  Laterality: N/A;   REMOVE AND REPLACE LENS Right 03/01/2016   Procedure: REMOVE AND REPLACE LENS;  Surgeon: Leandrew Koyanagi, MD;  Location: [place];  Service: Ophthalmology;  Laterality: Right;   TEE WITHOUT CARDIOVERSION N/A 01/03/2014   Procedure: TRANSESOPHAGEAL ECHOCARDIOGRAM (TEE);  Surgeon: Ivin Poot, MD;  [facility];  Service: Open Heart Surgery;  Laterality: N/A;   TONSILLECTOMY      Current Outpatient Medications  Medication Sig Dispense Refill   acetaminophen (TYLENOL) 325 MG tablet Take 2 tablets (650 mg total) by mouth every 6 (six)  hours as needed for mild pain (or Fever >/= 101).     carvedilol (COREG) 3.125 MG tablet Take 1 tablet by mouth 2 (two) times daily.     chlorhexidine (PERIDEX) 0.12 % solution chlorhexidine gluconate 0.12 % mouthwash  RINSE AND SPIT WITH 1/2 OZ FOR 30 SECONDS BID FOR 2 WEEKS     Cholecalciferol (VITAMIN D-3) 5000 UNITS TABS Take 5,000 mg by mouth daily.     fenofibrate (TRICOR) 145 MG tablet Take 145 mg by mouth daily.     l-methylfolate-B6-B12 (METANX) 3-35-2 MG TABS tablet Take 1 tablet by mouth daily. am     lisinopril (PRINIVIL,ZESTRIL) 40 MG tablet Take 40 mg by mouth daily. am     metFORMIN (GLUCOPHAGE) 1000 MG tablet Take 1,000 mg by mouth 2 (two) times daily with a meal. Am and bedtime     Multiple Vitamin (MULTIVITAMIN) capsule Take 1 capsule by mouth daily.     Omega-3 Fatty Acids (FISH OIL) 1000 MG CAPS omega-3 acid ethyl esters 1 gram capsule     rosuvastatin (CRESTOR) 40 MG tablet Take 1 tablet (40 mg total) by mouth daily. 30 tablet 0   Semaglutide,0.25 or 0.5MG/DOS, (OZEMPIC, 0.25 OR 0.5 MG/DOSE,) 2 MG/1.5ML SOPN Ozempic 0.25 mg or 0.5 mg (2 mg/1.5 mL) subcutaneous pen injector     ticagrelor (BRILINTA) 90 MG TABS tablet Take 1 tablet (90 mg total) by mouth 2 (two) times daily. 60 tablet 0   Blood Glucose Monitoring Suppl ([place]) w/Device KIT U UTD TO TEST BLOOD SUGAR (Patient not taking: Reported on 05/23/2021)     Exenatide ER 2 MG SRER Inject into the skin once a week. (Patient not taking: Reported on 05/04/2020)     glucose blood (ONETOUCH ULTRA) test strip U UTD TO TEST BLOOD SUGAR (Patient not taking: Reported on 05/04/2020)     glucose blood (PRECISION QID TEST) test strip U UTD TO TEST BLOOD SUGAR (Patient not taking: Reported on 05/04/2020)     isosorbide mononitrate (IMDUR) 30 MG 24 hr tablet Take 1 tablet (30 mg total) by mouth 2 (two) times daily. 180 tablet 3   Methylcobalamin 1 MG CHEW Chew by mouth. (Patient not taking: Reported on 04/23/2020)      Ubiquinol 200 MG CAPS Take by mouth daily. (Patient not taking: Reported on 04/23/2020)     No current facility-administered medications for this visit.   Facility-Administered Medications Ordered in Other Visits  Medication Dose Route Frequency Provider Last Rate Last Admin   sodium chloride flush (NS) 0.9 % injection 3 mL  3 mL Intravenous Q12H Dionisio [name], MD         Allergies:   Kiwi extract   Social History:  The patient  reports that he quit smoking about 56 years ago. His smoking use included cigarettes. He has a 0.50 pack-year smoking history. He has never used smokeless tobacco. He reports that he does not drink alcohol and does not use drugs.   Family History:   family history includes COPD in his mother; Peripheral vascular disease in his brother and father; Stroke (age of onset: 80) in his brother.    Review of Systems: Review of Systems  Constitutional: Negative.   HENT: Negative.  Respiratory: Negative.    Cardiovascular: Negative.   Gastrointestinal: Negative.   Musculoskeletal: Negative.   Neurological: Negative.   Psychiatric/Behavioral: Negative.    All other systems reviewed and are negative.   PHYSICAL EXAM: VS:  BP (!) 170/90 (BP Location: Left Arm, Patient Position: Sitting, Cuff Size: Normal)    Pulse 80    Ht 5' 10.5" (1.791 m)    Wt 247 lb 6 oz (112.2 kg)    SpO2 95%    BMI 34.99 kg/m  , BMI Body mass index is 34.99 kg/m. Constitutional:  oriented to person, place, and time. No distress.  HENT:  Head: Grossly normal Eyes:  no discharge. No scleral icterus.  Neck: No JVD, no carotid bruits  Cardiovascular: Regular rate and rhythm, no murmurs appreciated Pulmonary/Chest: Clear to auscultation bilaterally, no wheezes or rails Abdominal: Soft.  no distension.  no tenderness.  Musculoskeletal: Normal range of motion Neurological:  normal muscle tone. Coordination normal. No atrophy Skin: Skin warm and dry Psychiatric: normal affect,  pleasant  Recent Labs: No results found for requested labs within last 8760 hours.    Lipid Panel Lab Results  Component Value Date   CHOL 121 04/22/2020   HDL 46 04/22/2020   LDLCALC 46 04/22/2020   TRIG 146 04/22/2020      Wt Readings from Last 3 Encounters:  05/23/21 247 lb 6 oz (112.2 kg)  11/02/20 244 lb (110.7 kg)  05/11/20 205 lb 3.2 oz (93.1 kg)     ASSESSMENT AND PLAN:  Problem List Items Addressed This Visit   None Visit Diagnoses     Coronary artery disease of native artery of native heart with stable angina pectoris ([place])    -  Primary   Relevant Medications   isosorbide mononitrate (IMDUR) 30 MG 24 hr tablet   Other Relevant Orders   EKG 12-Lead   Diabetes mellitus type 2 with complications (HCC)       PAD (peripheral artery disease) (HCC)       Relevant Medications   isosorbide mononitrate (IMDUR) 30 MG 24 hr tablet   Other Relevant Orders   EKG 12-Lead   Mixed hyperlipidemia       Relevant Medications   isosorbide mononitrate (IMDUR) 30 MG 24 hr tablet   Essential hypertension       Relevant Medications   isosorbide mononitrate (IMDUR) 30 MG 24 hr tablet     CAD with stable angina Currently with no symptoms of angina. No further workup at this time. Continue current medication regimen.  Hyperlipidemia Cholesterol is at goal on the current lipid regimen. No changes to the medications were made.  Diabetes type 2 with complications We have encouraged continued exercise, careful diet management in an effort to lose weight.  Peripheral arterial disease Prior carotid endarterectomy on the left Periodic ultrasound  Essential hypertension Continue current medications with increase of isosorbide up to 30 mg twice a day   Total encounter time more than 30 minutes  Greater than 50% was spent in counseling and coordination of care with the patient    Signed, Esmond Plants, M.D., Ph.D. [place],  [place]

## 2021-05-23 ENCOUNTER — Other Ambulatory Visit: Payer: Self-pay

## 2021-05-23 ENCOUNTER — Ambulatory Visit: Payer: Medicare PPO | Admitting: Cardiovascular Disease

## 2021-05-23 ENCOUNTER — Encounter: Payer: Self-pay | Admitting: Cardiovascular Disease

## 2021-05-23 VITALS — BP 170/90 | HR 80 | Ht 70.5 in | Wt 247.4 lb

## 2021-05-23 DIAGNOSIS — E118 Type 2 diabetes mellitus with unspecified complications: Secondary | ICD-10-CM | POA: Diagnosis not present

## 2021-05-23 DIAGNOSIS — E782 Mixed hyperlipidemia: Secondary | ICD-10-CM

## 2021-05-23 DIAGNOSIS — I739 Peripheral vascular disease, unspecified: Secondary | ICD-10-CM

## 2021-05-23 DIAGNOSIS — I10 Essential (primary) hypertension: Secondary | ICD-10-CM

## 2021-05-23 DIAGNOSIS — I25118 Atherosclerotic heart disease of native coronary artery with other forms of angina pectoris: Secondary | ICD-10-CM

## 2021-05-23 MED ORDER — ISOSORBIDE MONONITRATE ER 30 MG PO TB24: 30.0000 mg | ORAL_TABLET | Freq: Two times a day (BID) | ORAL | 3 refills | Status: AC

## 2021-05-23 NOTE — Patient Instructions (Addendum)
Medication Instructions:  ?Please increase  ? imdur/isosorbide up to 30 mg twice a day ? ?If you need a refill on your cardiac medications before your next appointment, please call your pharmacy.  ? ?Lab work: ?No new labs needed ? ?Testing/Procedures: ?No new testing needed ? ?Follow-Up: ?At [facility] you and your health needs are our priority.  As part of our continuing mission to provide you with exceptional heart care, we have created designated Provider Care Teams.  These Care Teams include your primary Cardiologist (physician) and Advanced Practice Providers (APPs -  Physician Assistants and Nurse Practitioners) who all work together to provide you with the care you need, when you need it. ? ?You will need a follow up appointment in 6 months ? ?Providers on your designated Care Team:   ?Nicolasa Ducking, NP ?Eula Listen, PA-C ?Cadence Fransico Michael, PA-C ? ?COVID-19 Vaccine Information can be found at: [email or web address] For questions related to vaccine distribution or appointments, please email [email or web address] or call [phone].  ? ?Please monitor blood pressures and keep a log of your readings.  ?Call the clinic in 2 weeks with BP readings or can upload to your MyChart account  ? ?How to use a home blood pressure monitor. ?Be still. ?Measure at the same time every day. It?s important to take the readings at the same time each day, such as morning and evening. Take reading approximately 1 1/2 to 2 hours after BP medications. ? ? AVOID these things for 30 minutes before checking your blood pressure: ?Drinking caffeine. ?Drinking alcohol. ?Eating. ?Smoking. ?Exercising. ?  ?Five minutes before checking your blood pressure: ?Pee. ?Sit in a dining chair. Avoid sitting in a soft couch or armchair. ?Be quiet. Do not talk. ?   ?   Sit correctly. Sit with your back straight and supported (on a dining chair, rather than a sofa). Your feet should be flat  on the floor and your legs should not be crossed. Your arm should be supported on a flat surface (such as a table) with the upper arm at heart level. Make sure the bottom of the cuff is placed directly above the bend of the elbow.  ? ?

## 2021-07-13 IMAGING — CT CT HEAD W/O CM
3 series · 15 of 46 positions shown, 18 images · non-contrast
Comparison: MRI brain February 11, 2010 and head CT February 07, 2010.

CLINICAL DATA: Mental status change, unknown cause. Confusion since
this a.m.

EXAM:
CT HEAD WITHOUT CONTRAST
TECHNIQUE: Contiguous axial images were obtained from the base of the skull
through the vertex without intravenous contrast.

[Series 2: head wo · axial · 0.43mm/px · z∈[-139,-19]mm · 9 of 29 slices shown, 12 images]
[im 3/29  brain]
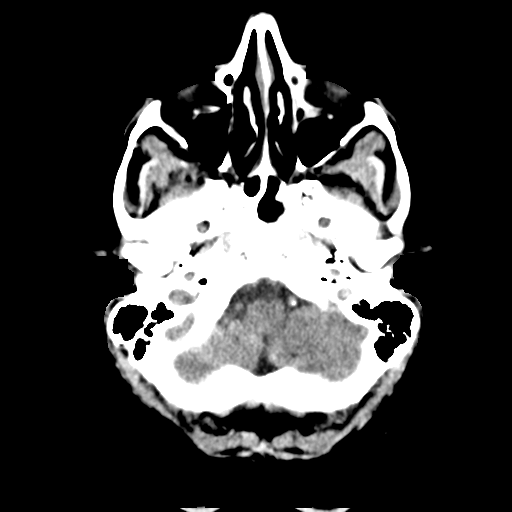
[im 3/29  bone]
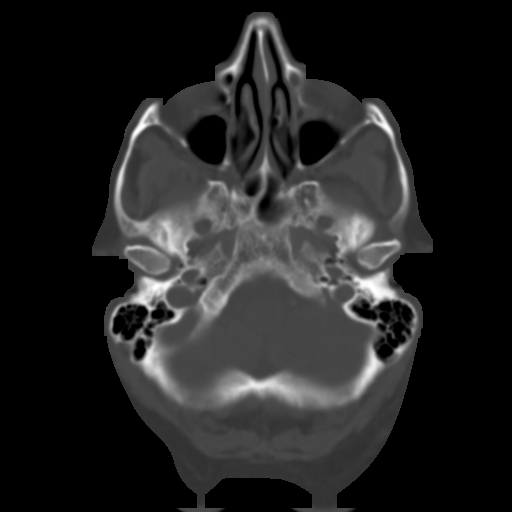
[im 6/29  brain]
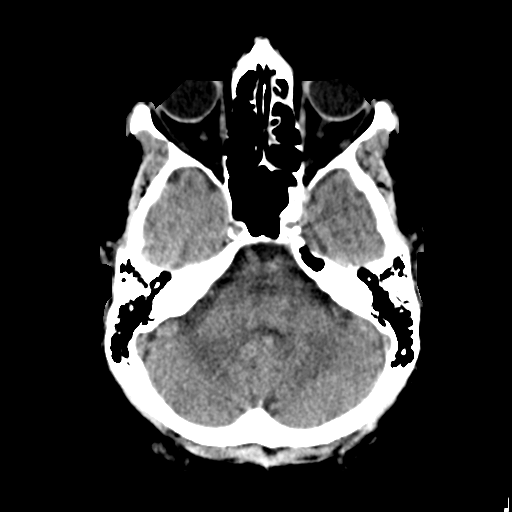
[im 9/29  brain]
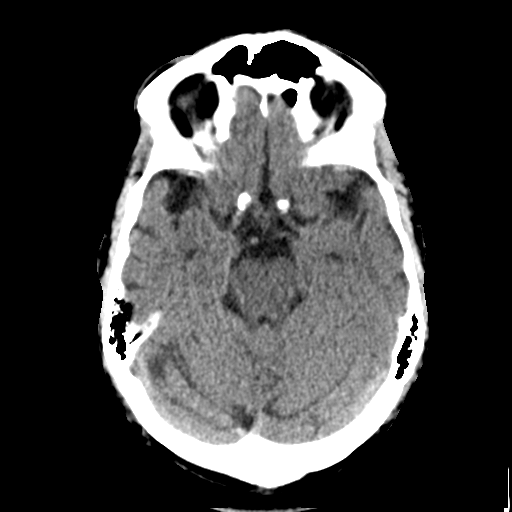
[im 12/29  brain]
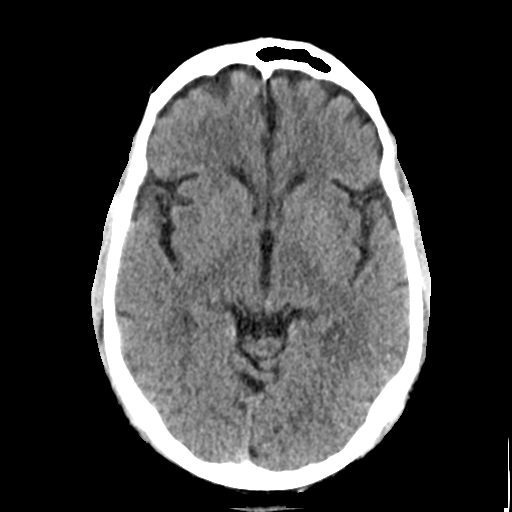
[im 15/29  brain]
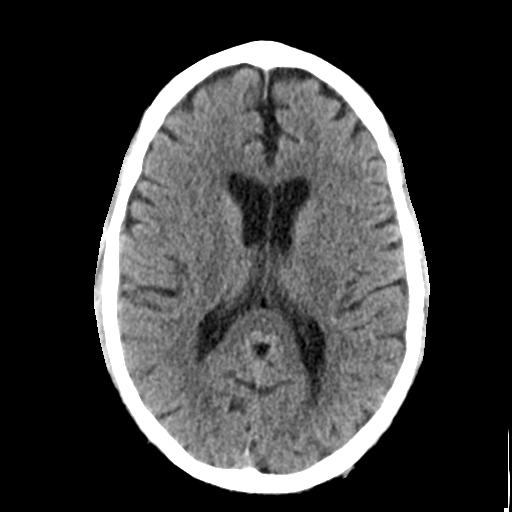
[im 15/29  bone]
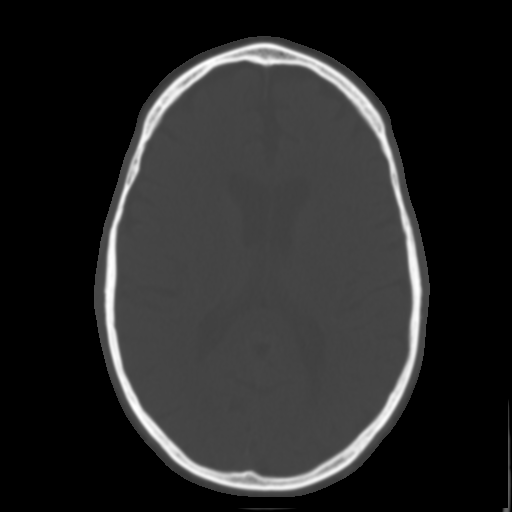
[im 18/29  brain]
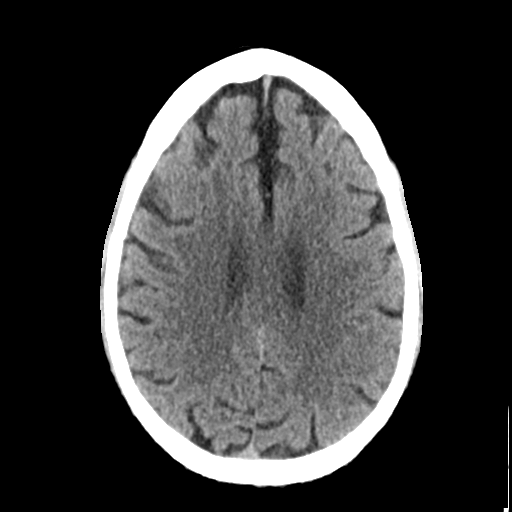
[im 21/29  brain]
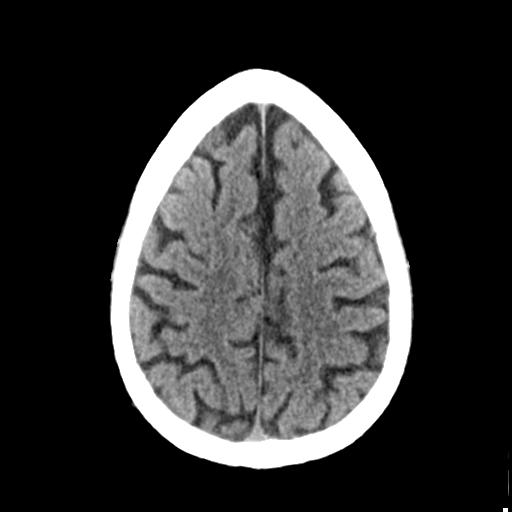
[im 24/29  brain]
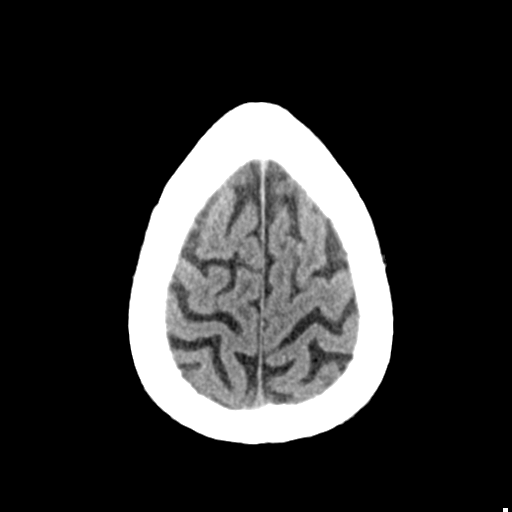
[im 27/29  brain]
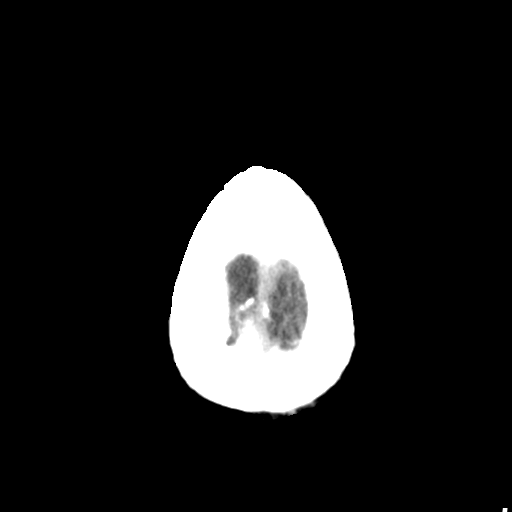
[im 27/29  bone]
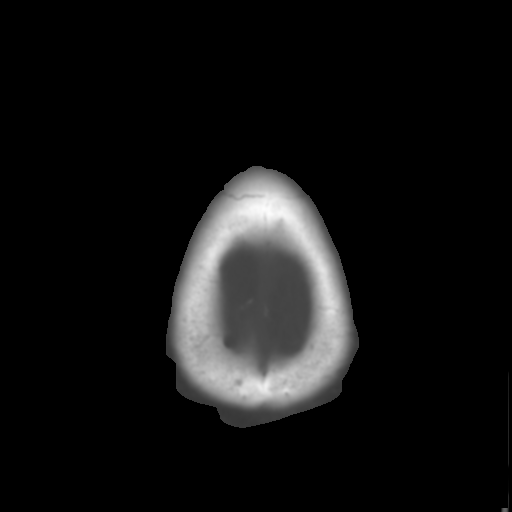

[Series 4: coronal soft tissue · coronal · 0.31mm/px · 3 of 71 slices shown]
[im 24/71  brain]
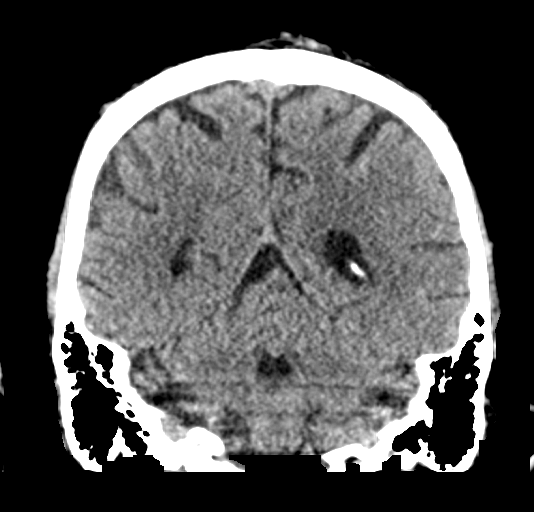
[im 32/71  brain]
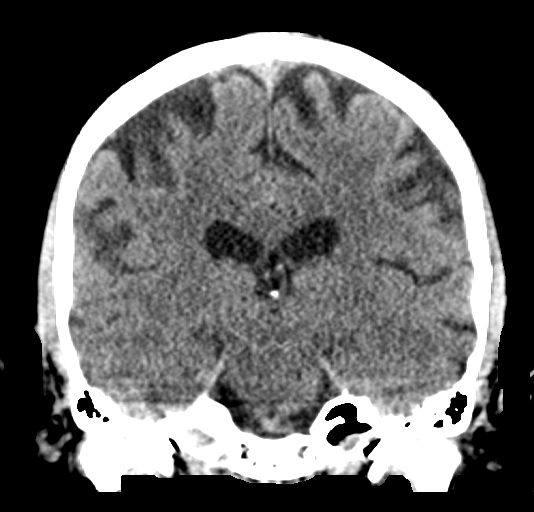
[im 39/71  brain]
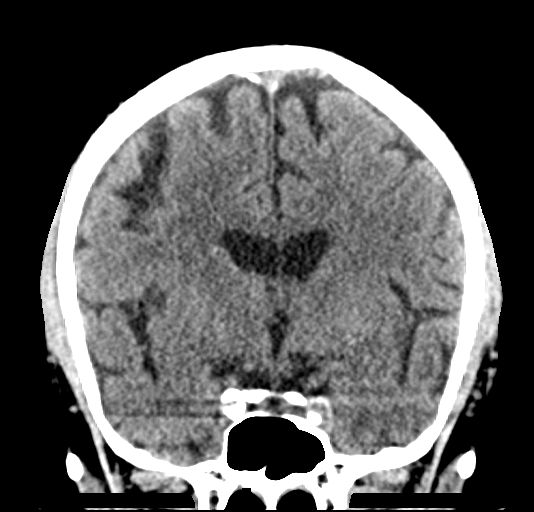

[Series 5: sagittal soft tissue · sagittal · 0.29mm/px · 3 of 61 slices shown]
[im 21/61  brain]
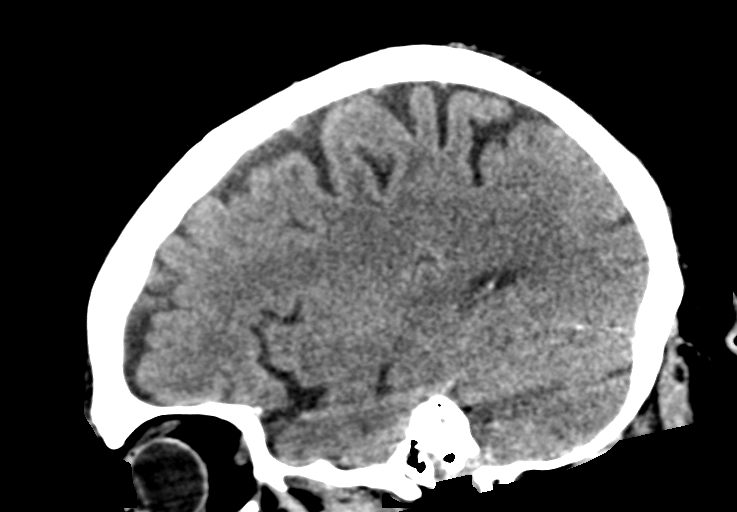
[im 31/61  brain]
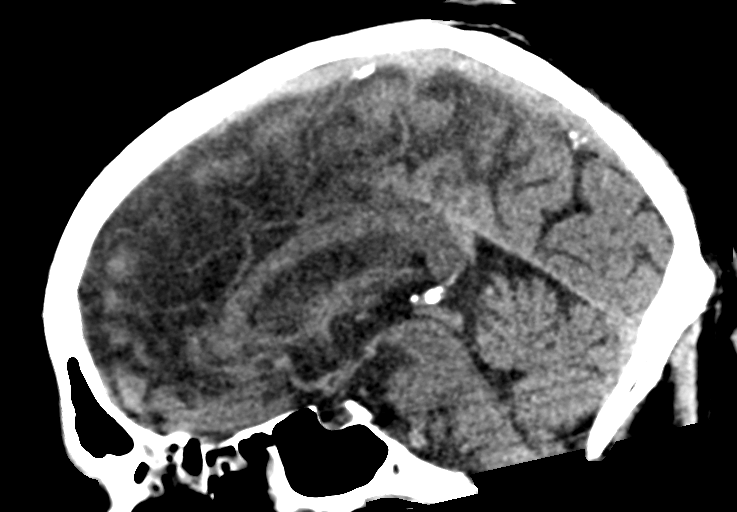
[im 41/61  brain]
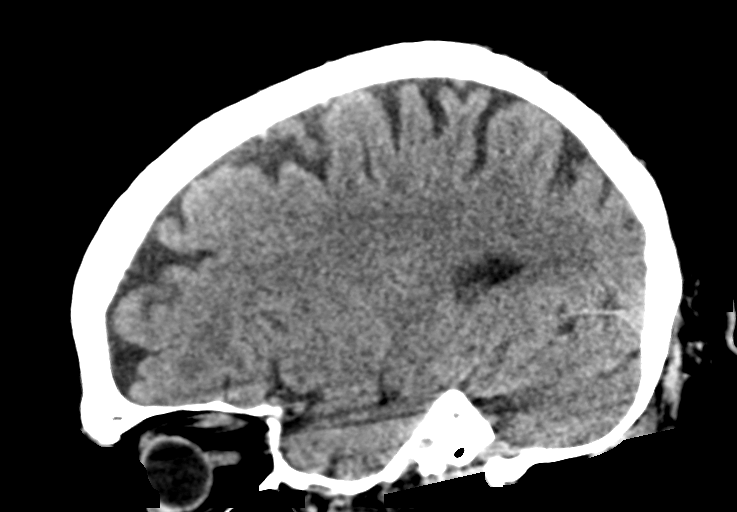

[15 of 46 positions shown; findings below may reference images not displayed]

FINDINGS: Brain: No evidence of acute infarction, hemorrhage, hydrocephalus,
extra-axial collection or mass lesion/mass effect.

Vascular: No hyperdense vessel or unexpected calcification.
Atherosclerotic calcifications of the internal carotid and vertebral
arteries.

Skull: Normal. Negative for fracture or focal lesion.

Sinuses/Orbits: Scattered mucosal thickening of the ethmoid sinuses.

Other: None.
IMPRESSION: 1. No acute intracranial findings.
2. Scattered mucosal thickening of the ethmoid sinuses.

## 2021-08-03 ENCOUNTER — Encounter: Payer: Self-pay | Admitting: Cardiovascular Disease

## 2021-08-11 ENCOUNTER — Encounter: Payer: Self-pay | Admitting: *Deleted

## 2021-11-22 NOTE — Progress Notes (Unsigned)
Cardiology Office Note  Date:  11/23/2021   ID:  [name], DOB [birth year], MRN [id number]  PCP:  Juluis Pitch, MD   Chief Complaint  Patient presents with   6 month follow up     "Doing well." Medications reviewed by the patient verbally.     HPI:  [name] is a 71 year old gentleman with past medical history of Diabetes Hyperlipidemia Hypertension Remote smoking history PAD, carotid stenosis, CEA on the left Coronary artery disease, prior stent to proximal RCA 2008  History of CABG Catheterization from Feb 2022 occluded vein graft to the RCA, occluded vein graft to the diagonal, patent vein graft to OM Severe distal left main, ostial LAD and mid LAD disease estimated 70 to 80% Who presents for routine follow-up of his coronary artery disease  LOV 3/23 Reports his weight got up to 255 pounds Since then has changed his diet and activity Lost weight, on ozempic, Started exercising for the past 1 month ago, weight trending down 20 pounds Glucose down to 124 at home  Goes to the gym, does weights and aerobic  Reports he was started on losartan with his lisinopril by primary care  Recent lab work discussed Results below from before weight loss A1c 8.4 up from 6.7 Total cholesterol 158 LDL 75 Creatinine 1.3 BUN 32  very busy, teaching, publishing a book, also teaches meditation Did not chest pain or shortness of breath on exertion Blood pressure well controlled  EKG personally reviewed by myself on todays visit Normal sinus rhythm rate 83 bpm right bundle branch block consider old inferior MI  Other past medical history reviewed Prior history Non smoker, quit age 53 A1C linked to weight, typically in the 6 range  In hospital 04/2020, records reviewed  NSTEMI (non-ST elevated myocardial infarction) ([place])  Acute encephalopathy  AKI (acute kidney injury) ([place])  Lab work reviewed A1c 6.7 Total cholesterol 121, LDL 46  PMH:   has a past medical  history of CAD (coronary artery disease), Carotid arterial disease ([place]), Diabetes mellitus ([place]), HTN (hypertension), Hyperlipidemia, Hypertension, Morbid obesity ([place]), Myocardial infarction ([place]), Neuromuscular disorder ([place]), Neuropathy, Plantar fasciitis, Pneumonia, and Wears dentures.  PSH:    Past Surgical History:  Procedure Laterality Date   CAROTID ENDARTERECTOMY     CATARACT EXTRACTION W/PHACO Right 11/10/2015   Procedure: CATARACT EXTRACTION PHACO AND INTRAOCULAR LENS PLACEMENT (IOC);  Surgeon: Leandrew Koyanagi, MD;  Location: [place];  Service: Ophthalmology;  Laterality: Right;  DIABETIC-oral meds RESTOR TORIC MULTIFOCAL LENS   CATARACT EXTRACTION W/PHACO Left 01/06/2016   Procedure: CATARACT EXTRACTION PHACO AND INTRAOCULAR LENS PLACEMENT ([place]);  Surgeon: Leandrew Koyanagi, MD;  Location: [facility];  Service: Ophthalmology;  Laterality: Left;  Lot# [id number] H US:01:12.1 AP%: 18.5 CDE: 13.36   CORONARY ANGIOPLASTY     CORONARY ARTERY BYPASS GRAFT N/A 01/03/2014   Procedure: CORONARY ARTERY BYPASS GRAFTING (CABG) x four, using left internal mammary and right leg greater saphenous vein harvested endoscopically;  Surgeon: Ivin Poot, MD;  Location: [place];  Service: Open Heart Surgery;  Laterality: N/A;   FRACTURE SURGERY     ankle surgery   LEFT HEART CATH AND CORS/GRAFTS ANGIOGRAPHY N/A 04/22/2020   Procedure: LEFT HEART CATH AND CORS/GRAFTS ANGIOGRAPHY;  Surgeon: Dionisio [name], MD;  Location: [place] [facility];  Service: Cardiovascular;  Laterality: N/A;   REMOVE AND REPLACE LENS Right 03/01/2016   Procedure: REMOVE AND REPLACE LENS;  Surgeon: Leandrew Koyanagi, MD;  Location: [place];  Service:  Ophthalmology;  Laterality: Right;   TEE WITHOUT CARDIOVERSION N/A 01/03/2014   Procedure: TRANSESOPHAGEAL ECHOCARDIOGRAM (TEE);  Surgeon: Ivin Poot, MD;  Location: [place];  Service: Open Heart Surgery;  Laterality: N/A;   TONSILLECTOMY       Current Outpatient Medications  Medication Sig Dispense Refill   acetaminophen (TYLENOL) 325 MG tablet Take 2 tablets (650 mg total) by mouth every 6 (six) hours as needed for mild pain (or Fever >/= 101).     aspirin EC 81 MG tablet Take 81 mg by mouth daily. Swallow whole.     carvedilol (COREG) 3.125 MG tablet Take 1 tablet by mouth 2 (two) times daily.     chlorhexidine (PERIDEX) 0.12 % solution chlorhexidine gluconate 0.12 % mouthwash  RINSE AND SPIT WITH 1/2 OZ FOR 30 SECONDS BID FOR 2 WEEKS     Cholecalciferol (VITAMIN D-3) 5000 UNITS TABS Take 5,000 mg by mouth daily.     Exenatide ER 2 MG SRER Inject into the skin once a week.     fenofibrate (TRICOR) 145 MG tablet Take 145 mg by mouth daily.     isosorbide mononitrate (IMDUR) 30 MG 24 hr tablet Take 1 tablet (30 mg total) by mouth 2 (two) times daily. 180 tablet 3   l-methylfolate-B6-B12 (METANX) 3-35-2 MG TABS tablet Take 1 tablet by mouth daily. am     lisinopril (PRINIVIL,ZESTRIL) 40 MG tablet Take 40 mg by mouth daily. am     losartan (COZAAR) 50 MG tablet Take 1 tablet by mouth daily.     metFORMIN (GLUCOPHAGE) 1000 MG tablet Take 1,000 mg by mouth 2 (two) times daily with a meal. Am and bedtime     Multiple Vitamin (MULTIVITAMIN) capsule Take 1 capsule by mouth daily.     rosuvastatin (CRESTOR) 40 MG tablet Take 1 tablet (40 mg total) by mouth daily. 30 tablet 0   Semaglutide,0.25 or 0.5MG/DOS, (OZEMPIC, 0.25 OR 0.5 MG/DOSE,) 2 MG/1.5ML SOPN Ozempic 0.25 mg or 0.5 mg (2 mg/1.5 mL) subcutaneous pen injector     ticagrelor (BRILINTA) 90 MG TABS tablet Take 1 tablet (90 mg total) by mouth 2 (two) times daily. 60 tablet 0   Blood Glucose Monitoring Suppl ([place]) w/Device KIT U UTD TO TEST BLOOD SUGAR (Patient not taking: Reported on 05/23/2021)     glucose blood (ONETOUCH ULTRA) test strip U UTD TO TEST BLOOD SUGAR (Patient not taking: Reported on 05/04/2020)     glucose blood (PRECISION QID TEST) test strip U  UTD TO TEST BLOOD SUGAR (Patient not taking: Reported on 05/04/2020)     Methylcobalamin 1 MG CHEW Chew by mouth. (Patient not taking: Reported on 04/23/2020)     Ubiquinol 200 MG CAPS Take by mouth daily. (Patient not taking: Reported on 04/23/2020)     No current facility-administered medications for this visit.   Facility-Administered Medications Ordered in Other Visits  Medication Dose Route Frequency Provider Last Rate Last Admin   sodium chloride flush (NS) 0.9 % injection 3 mL  3 mL Intravenous Q12H Dionisio [name], MD         Allergies:   Kiwi extract   Social History:  The patient  reports that he quit smoking about 56 years ago. His smoking use included cigarettes. He has a 0.50 pack-year smoking history. He has never used smokeless tobacco. He reports that he does not drink alcohol and does not use drugs.   Family History:   family history includes COPD in his mother;  Peripheral vascular disease in his brother and father; Stroke (age of onset: 69) in his brother.    Review of Systems: Review of Systems  Constitutional: Negative.   HENT: Negative.    Respiratory: Negative.    Cardiovascular: Negative.   Gastrointestinal: Negative.   Musculoskeletal: Negative.   Neurological: Negative.   Psychiatric/Behavioral: Negative.    All other systems reviewed and are negative.    PHYSICAL EXAM: VS:  BP 110/60 (BP Location: Left Arm, Patient Position: Sitting, Cuff Size: Normal)   Pulse 83   Ht _0  (1.803 m)   Wt 237 lb 6 oz (107.7 kg)   SpO2 97%   BMI 33.11 kg/m  , BMI Body mass index is 33.11 kg/m. Constitutional:  oriented to person, place, and time. No distress.  Obese HENT:  Head: Grossly normal Eyes:  no discharge. No scleral icterus.  Neck: No JVD, no carotid bruits  Cardiovascular: Regular rate and rhythm, no murmurs appreciated Pulmonary/Chest: Clear to auscultation bilaterally, no wheezes or rails Abdominal: Soft.  no distension.  no tenderness.   Musculoskeletal: Normal range of motion Neurological:  normal muscle tone. Coordination normal. No atrophy Skin: Skin warm and dry Psychiatric: normal affect, pleasant  Recent Labs: No results found for requested labs within last 365 days.    Lipid Panel Lab Results  Component Value Date   CHOL 121 04/22/2020   HDL 46 04/22/2020   LDLCALC 46 04/22/2020   TRIG 146 04/22/2020      Wt Readings from Last 3 Encounters:  11/23/21 237 lb 6 oz (107.7 kg)  05/23/21 247 lb 6 oz (112.2 kg)  11/02/20 244 lb (110.7 kg)     ASSESSMENT AND PLAN:  Problem List Items Addressed This Visit   None Visit Diagnoses     Coronary artery disease of native artery of native heart with stable angina pectoris ([place])    -  Primary   Relevant Medications   losartan (COZAAR) 50 MG tablet   aspirin EC 81 MG tablet   Diabetes mellitus type 2 with complications (HCC)       Relevant Medications   losartan (COZAAR) 50 MG tablet   aspirin EC 81 MG tablet   PAD (peripheral artery disease) (HCC)       Relevant Medications   losartan (COZAAR) 50 MG tablet   aspirin EC 81 MG tablet   Mixed hyperlipidemia       Relevant Medications   losartan (COZAAR) 50 MG tablet   aspirin EC 81 MG tablet   Essential hypertension       Relevant Medications   losartan (COZAAR) 50 MG tablet   aspirin EC 81 MG tablet   Hx of CABG       Bilateral carotid artery stenosis       Relevant Medications   losartan (COZAAR) 50 MG tablet   aspirin EC 81 MG tablet     CAD with stable angina Currently with no symptoms of angina. No further workup at this time.  Decrease Brilinta down to 60 twice daily.  Presumably Brilinta was started February 2022 at the time of cardiac catheterization, no intervention performed at that time.  We will look to wean off in follow-up  Hyperlipidemia Numbers were above goal though should be back in range after recent weight loss and improved A1c No medication changes made  Diabetes type 2 with  complications Y7C had trended higher, since then has lost weight on Ozempic now exercising  Peripheral arterial disease Prior carotid endarterectomy  on the left We will suggest ultrasound in follow-up  Essential hypertension Blood pressure low, Recommend he talk with primary care whether both losartan and ACE inhibitor are needed.  Higher risk of complications    Total encounter time more than 30 minutes  Greater than 50% was spent in counseling and coordination of care with the patient    Signed, Esmond Plants, M.D., Ph.D. [place], [place]

## 2021-11-23 ENCOUNTER — Encounter: Payer: Self-pay | Admitting: Cardiovascular Disease

## 2021-11-23 ENCOUNTER — Ambulatory Visit: Payer: Medicare PPO | Attending: Cardiovascular Disease | Admitting: Cardiovascular Disease

## 2021-11-23 VITALS — BP 110/60 | HR 83 | Ht 71.0 in | Wt 237.4 lb

## 2021-11-23 DIAGNOSIS — E782 Mixed hyperlipidemia: Secondary | ICD-10-CM

## 2021-11-23 DIAGNOSIS — Z951 Presence of aortocoronary bypass graft: Secondary | ICD-10-CM

## 2021-11-23 DIAGNOSIS — I6523 Occlusion and stenosis of bilateral carotid arteries: Secondary | ICD-10-CM

## 2021-11-23 DIAGNOSIS — I25118 Atherosclerotic heart disease of native coronary artery with other forms of angina pectoris: Secondary | ICD-10-CM

## 2021-11-23 DIAGNOSIS — I739 Peripheral vascular disease, unspecified: Secondary | ICD-10-CM | POA: Diagnosis not present

## 2021-11-23 DIAGNOSIS — I10 Essential (primary) hypertension: Secondary | ICD-10-CM

## 2021-11-23 DIAGNOSIS — E118 Type 2 diabetes mellitus with unspecified complications: Secondary | ICD-10-CM | POA: Diagnosis not present

## 2021-11-23 MED ORDER — TICAGRELOR 60 MG PO TABS: 90.0000 mg | ORAL_TABLET | Freq: Two times a day (BID) | ORAL | 3 refills | Status: DC

## 2021-11-23 MED ORDER — TICAGRELOR 60 MG PO TABS: 60.0000 mg | ORAL_TABLET | Freq: Two times a day (BID) | ORAL | 3 refills | Status: AC

## 2021-11-23 NOTE — Patient Instructions (Addendum)
Medication Instructions:   Your physician has recommended you make the following change in your medication:    DECREASE  Brilinta to 60mg  Twice a day   If you need a refill on your cardiac medications before your next appointment, please call your pharmacy.   Lab work:  No new labs needed  Testing/Procedures:  No new testing needed  Follow-Up: At [facility], you and your health needs are our priority.  As part of our continuing mission to provide you with exceptional heart care, we have created designated Provider Care Teams.  These Care Teams include your primary Cardiologist (physician) and Advanced Practice Providers (APPs -  Physician Assistants and Nurse Practitioners) who all work together to provide you with the care you need, when you need it.  You will need a follow up appointment in 12 months  Providers on your designated Care Team:   [facility], NP Nicolasa Ducking, PA-C Cadence Eula Listen, Fransico Michael  COVID-19 Vaccine Information can be found at: [place] For questions related to vaccine distribution or appointments, please email [email or web address] or call [phone].

## 2023-02-04 NOTE — Progress Notes (Unsigned)
Cardiology Office Note  Date:  02/05/2023   ID:  Jake Castro, DOB [birth year], MRN [id number]  PCP:  Jake Baseman, MD   Chief Complaint  Patient presents with   1 year follow up     "Doing well."     HPI:  [name] is a 72 year old gentleman with past medical history of Diabetes Hyperlipidemia Hypertension Remote smoking history PAD, carotid stenosis, CEA on the left Coronary artery disease, prior stent to proximal RCA 2008  History of CABG Catheterization from Feb 2022 occluded vein graft to the RCA, occluded vein graft to the diagonal, patent vein graft to OM Severe distal left main, ostial LAD and mid LAD disease estimated 70 to 80% Who presents for routine follow-up of his coronary artery disease  LOV 9/23 Active, gym, walks dog  Not taking evening meds Sometimes misses AM meds Overall reports well, feels sugars well-controlled Missing evening dosing metoprolol, misses evening dose of Brilinta, carvedilol  Weight down from 255 to 211 Lost weight, on ozempic, Goes to the gym, does weights and aerobic  Lab work reviewed A1c 6.1 Total cholesterol 135 LDL 68 Creatinine 1.2  EKG personally reviewed by myself on todays visit EKG Interpretation Date/Time:  Monday February 05 2023 10:32:54 EST Ventricular Rate:  64 PR Interval:  178 QRS Duration:  142 QT Interval:  426 QTC Calculation: 439 R Axis:   6  Text Interpretation: Sinus rhythm with Premature atrial complexes Right bundle branch block Inferior infarct (cited on or before 27-Feb-1995) When compared with ECG of 05-Feb-2023 10:32, Premature atrial complexes are now Present Confirmed by Julien Nordmann [phone]) on 02/05/2023 10:36:53 AM    Other past medical history reviewed Prior history Non smoker, quit age 76 A1C linked to weight, typically in the 6 range  In hospital 04/2020, records reviewed  NSTEMI (non-ST elevated myocardial infarction) (HCC)  Acute encephalopathy  AKI (acute kidney  injury) (HCC)   PMH:   has a past medical history of CAD (coronary artery disease), Carotid arterial disease (HCC), Diabetes mellitus (HCC), HTN (hypertension), Hyperlipidemia, Hypertension, Morbid obesity (HCC), Myocardial infarction (HCC), Neuromuscular disorder (HCC), Neuropathy, Plantar fasciitis, Pneumonia, and Wears dentures.  PSH:    Past Surgical History:  Procedure Laterality Date   CAROTID ENDARTERECTOMY     CATARACT EXTRACTION W/PHACO Right 11/10/2015   Procedure: CATARACT EXTRACTION PHACO AND INTRAOCULAR LENS PLACEMENT (IOC);  Surgeon: Lockie Mola, MD;  Location: [facility];  Service: Ophthalmology;  Laterality: Right;  DIABETIC-oral meds RESTOR TORIC MULTIFOCAL LENS   CATARACT EXTRACTION W/PHACO Left 01/06/2016   Procedure: CATARACT EXTRACTION PHACO AND INTRAOCULAR LENS PLACEMENT (IOC);  Surgeon: Lockie Mola, MD;  Location: [facility] ORS;  Service: Ophthalmology;  Laterality: Left;  Lot# [id number] H US:01:12.1 AP%: 18.5 CDE: 13.36   CORONARY ANGIOPLASTY     CORONARY ARTERY BYPASS GRAFT N/A 01/03/2014   Procedure: CORONARY ARTERY BYPASS GRAFTING (CABG) x four, using left internal mammary and right leg greater saphenous vein harvested endoscopically;  Surgeon: Kerin Perna, MD;  Location: [facility] OR;  Service: Open Heart Surgery;  Laterality: N/A;   FRACTURE SURGERY     ankle surgery   LEFT HEART CATH AND CORS/GRAFTS ANGIOGRAPHY N/A 04/22/2020   Procedure: LEFT HEART CATH AND CORS/GRAFTS ANGIOGRAPHY;  Surgeon: Laurier Nancy, MD;  Location: [facility];  Service: Cardiovascular;  Laterality: N/A;   REMOVE AND REPLACE LENS Right 03/01/2016   Procedure: REMOVE AND REPLACE LENS;  Surgeon: Lockie Mola, MD;  Location: [facility];  Service: Ophthalmology;  Laterality: Right;   TEE WITHOUT CARDIOVERSION N/A 01/03/2014   Procedure: TRANSESOPHAGEAL ECHOCARDIOGRAM (TEE);  Surgeon: Kerin Perna, MD;  Location: [facility] OR;  Service: Open Heart Surgery;   Laterality: N/A;   TONSILLECTOMY      Current Outpatient Medications  Medication Sig Dispense Refill   acetaminophen (TYLENOL) 325 MG tablet Take 2 tablets (650 mg total) by mouth every 6 (six) hours as needed for mild pain (or Fever >/= 101).     aspirin EC 81 MG tablet Take 81 mg by mouth daily. Swallow whole.     carvedilol (COREG) 3.125 MG tablet Take 1 tablet by mouth 2 (two) times daily.     chlorhexidine (PERIDEX) 0.12 % solution chlorhexidine gluconate 0.12 % mouthwash  RINSE AND SPIT WITH 1/2 OZ FOR 30 SECONDS BID FOR 2 WEEKS     Cholecalciferol (VITAMIN D-3) 5000 UNITS TABS Take 5,000 mg by mouth daily.     Exenatide ER 2 MG SRER Inject into the skin once a week.     fenofibrate (TRICOR) 145 MG tablet Take 145 mg by mouth daily.     isosorbide mononitrate (IMDUR) 30 MG 24 hr tablet Take 1 tablet (30 mg total) by mouth 2 (two) times daily. 180 tablet 3   l-methylfolate-B6-B12 (METANX) 3-35-2 MG TABS tablet Take 1 tablet by mouth daily. am     losartan (COZAAR) 50 MG tablet Take 1 tablet by mouth daily.     metFORMIN (GLUCOPHAGE) 1000 MG tablet Take 1,000 mg by mouth 2 (two) times daily with a meal. Am and bedtime     Multiple Vitamin (MULTIVITAMIN) capsule Take 1 capsule by mouth daily.     rosuvastatin (CRESTOR) 40 MG tablet Take 1 tablet (40 mg total) by mouth daily. 30 tablet 0   ticagrelor (BRILINTA) 60 MG TABS tablet Take 1 tablet (60 mg total) by mouth 2 (two) times daily. 180 tablet 3   Blood Glucose Monitoring Suppl (ONETOUCH VERIO FLEX SYSTEM) w/Device KIT U UTD TO TEST BLOOD SUGAR (Patient not taking: Reported on 05/23/2021)     glucose blood (ONETOUCH ULTRA) test strip U UTD TO TEST BLOOD SUGAR (Patient not taking: Reported on 05/04/2020)     glucose blood (PRECISION QID TEST) test strip U UTD TO TEST BLOOD SUGAR (Patient not taking: Reported on 05/04/2020)     Methylcobalamin 1 MG CHEW Chew by mouth. (Patient not taking: Reported on 04/23/2020)     MOUNJARO 15 MG/0.5ML Pen  Inject 15 mg into the skin once a week.     Semaglutide,0.25 or 0.5MG /DOS, (OZEMPIC, 0.25 OR 0.5 MG/DOSE,) 2 MG/1.5ML SOPN Ozempic 0.25 mg or 0.5 mg (2 mg/1.5 mL) subcutaneous pen injector (Patient not taking: Reported on 02/05/2023)     Ubiquinol 200 MG CAPS Take by mouth daily. (Patient not taking: Reported on 02/05/2023)     No current facility-administered medications for this visit.   Facility-Administered Medications Ordered in Other Visits  Medication Dose Route Frequency Provider Last Rate Last Admin   sodium chloride flush (NS) 0.9 % injection 3 mL  3 mL Intravenous Q12H Laurier Nancy, MD         Allergies:   Kiwi extract   Social History:  The patient  reports that he quit smoking about 57 years ago. His smoking use included cigarettes. He started smoking about 58 years ago. He has a 0.5 pack-year smoking history. He has never used smokeless tobacco. He reports that he does not drink alcohol and does not use drugs.  Family History:   family history includes COPD in his mother; Peripheral vascular disease in his brother and father; Stroke (age of onset: 5) in his brother.    Review of Systems: Review of Systems  Constitutional: Negative.   HENT: Negative.    Respiratory: Negative.    Cardiovascular: Negative.   Gastrointestinal: Negative.   Musculoskeletal: Negative.   Neurological: Negative.   Psychiatric/Behavioral: Negative.    All other systems reviewed and are negative.   PHYSICAL EXAM: VS:  BP 110/60 (BP Location: Left Arm, Patient Position: Sitting, Cuff Size: Normal)   Pulse 63   Ht 5' 9.5" (1.765 m)   Wt 211 lb 8 oz (95.9 kg)   SpO2 97%   BMI 30.79 kg/m  , BMI Body mass index is 30.79 kg/m. Constitutional:  oriented to person, place, and time. No distress.  HENT:  Head: Grossly normal Eyes:  no discharge. No scleral icterus.  Neck: No JVD, no carotid bruits  Cardiovascular: Regular rate and rhythm, no murmurs appreciated Pulmonary/Chest: Clear to  auscultation bilaterally, no wheezes or rails Abdominal: Soft.  no distension.  no tenderness.  Musculoskeletal: Normal range of motion Neurological:  normal muscle tone. Coordination normal. No atrophy Skin: Skin warm and dry Psychiatric: normal affect, pleasant  Recent Labs: No results found for requested labs within last 365 days.    Lipid Panel Lab Results  Component Value Date   CHOL 121 04/22/2020   HDL 46 04/22/2020   LDLCALC 46 04/22/2020   TRIG 146 04/22/2020    Wt Readings from Last 3 Encounters:  02/05/23 211 lb 8 oz (95.9 kg)  11/23/21 237 lb 6 oz (107.7 kg)  05/23/21 247 lb 6 oz (112.2 kg)     ASSESSMENT AND PLAN:  Problem List Items Addressed This Visit   None Visit Diagnoses     Coronary artery disease of native artery of native heart with stable angina pectoris (HCC)    -  Primary   Relevant Orders   EKG 12-Lead (Completed)   Diabetes mellitus type 2 with complications (HCC)       Relevant Medications   MOUNJARO 15 MG/0.5ML Pen   PAD (peripheral artery disease) (HCC)       Relevant Orders   EKG 12-Lead (Completed)   Mixed hyperlipidemia       Relevant Orders   EKG 12-Lead (Completed)   Hx of CABG       Essential hypertension       Relevant Orders   EKG 12-Lead (Completed)   Bilateral carotid artery stenosis       Relevant Orders   EKG 12-Lead (Completed)      CAD with stable angina Currently with no symptoms of angina. No further workup at this time. Continue current medication regimen. Reports he is only taking Brilinta once a day with aspirin  Hyperlipidemia On Ozempic, Crestor Missing doses of Crestor, recommend he take this in the morning  Diabetes type 2 with complications A1c well-controlled, missing evening dose of metformin Weight down from 255 down to 211 He will discuss metformin ER with primary care  Peripheral arterial disease Prior carotid endarterectomy on the left Would benefit from repeat carotid  ultrasound  Essential hypertension Not taking evening carvedilol, we will try Coreg CR 10 daily at his request, he will take this in the morning    Signed, Dossie Arbour, M.D., Ph.D. [facility] [place], [place] [phone]

## 2023-02-05 ENCOUNTER — Ambulatory Visit: Payer: Medicare PPO | Attending: Cardiovascular Disease | Admitting: Cardiovascular Disease

## 2023-02-05 ENCOUNTER — Encounter: Payer: Self-pay | Admitting: Cardiovascular Disease

## 2023-02-05 VITALS — BP 110/60 | HR 63 | Ht 69.5 in | Wt 211.5 lb

## 2023-02-05 DIAGNOSIS — I25118 Atherosclerotic heart disease of native coronary artery with other forms of angina pectoris: Secondary | ICD-10-CM

## 2023-02-05 DIAGNOSIS — I6523 Occlusion and stenosis of bilateral carotid arteries: Secondary | ICD-10-CM

## 2023-02-05 DIAGNOSIS — E782 Mixed hyperlipidemia: Secondary | ICD-10-CM

## 2023-02-05 DIAGNOSIS — Z951 Presence of aortocoronary bypass graft: Secondary | ICD-10-CM

## 2023-02-05 DIAGNOSIS — E118 Type 2 diabetes mellitus with unspecified complications: Secondary | ICD-10-CM

## 2023-02-05 DIAGNOSIS — I739 Peripheral vascular disease, unspecified: Secondary | ICD-10-CM | POA: Diagnosis not present

## 2023-02-05 DIAGNOSIS — I10 Essential (primary) hypertension: Secondary | ICD-10-CM

## 2023-02-05 MED ORDER — CARVEDILOL PHOSPHATE ER 10 MG PO CP24
10.0000 mg | ORAL_CAPSULE | Freq: Every day | ORAL | 3 refills | Status: DC
Start: 2023-02-05 — End: 2023-08-27

## 2023-02-05 NOTE — Patient Instructions (Signed)
Medication Instructions:  Hold the coreg Start Coreg CR 10 mg daily   If you need a refill on your cardiac medications before your next appointment, please call your pharmacy.   Lab work: No new labs needed  Testing/Procedures: No new testing needed  Follow-Up: At [facility], you and your health needs are our priority.  As part of our continuing mission to provide you with exceptional heart care, we have created designated Provider Care Teams.  These Care Teams include your primary Cardiologist (physician) and Advanced Practice Providers (APPs -  Physician Assistants and Nurse Practitioners) who all work together to provide you with the care you need, when you need it.  You will need a follow up appointment in 12 months  Providers on your designated Care Team:   Nicolasa Ducking, NP Eula Listen, PA-C Cadence Fransico Michael, [place]  COVID-19 Vaccine Information can be found at: [email or web address] For questions related to vaccine distribution or appointments, please email [email or web address] or call [phone].

## 2023-08-27 ENCOUNTER — Other Ambulatory Visit: Payer: Self-pay

## 2023-08-27 MED ORDER — CARVEDILOL PHOSPHATE ER 10 MG PO CP24: 10.0000 mg | ORAL_CAPSULE | Freq: Every day | ORAL | 1 refills | Status: AC

## 2023-11-13 ENCOUNTER — Other Ambulatory Visit: Payer: Self-pay | Admitting: Medical Genetics

## 2023-11-23 ENCOUNTER — Other Ambulatory Visit
Admission: RE | Admit: 2023-11-23 | Discharge: 2023-11-23 | Disposition: A | Discharge status: Home / Self Care | Payer: Self-pay | Source: Ambulatory Visit | Attending: Medical Genetics | Admitting: Medical Genetics

## 2023-12-03 LAB — GENECONNECT MOLECULAR SCREEN: Genetic Analysis Overall Interpretation: NEGATIVE

## 2024-02-25 ENCOUNTER — Telehealth: Payer: Self-pay | Admitting: Cardiovascular Disease

## 2024-02-25 NOTE — Telephone Encounter (Signed)
 Pt had an episode yesterday where he lost vision for about 25 minutes. Pt saw an ophthalmology and was advised to f/u with cardiology. Pt scheduled 12/11 with CHRISTELLA Elks. Please advise.

## 2024-02-25 NOTE — Telephone Encounter (Signed)
 Returned the call to the patient. He stated that when he was in church yesterday, he saw flashes in his left eye and then was blinded with a white fog for 20-30 minutes. He denied a headache. He has not had any problems since.  He did seen ophthalmology and was cleared. The provider felt like it may be a cardiology event. The patient has a follow up on 12/11 with APP.  He is currently out of town and will return on Wednesday evening.

## 2024-02-26 ENCOUNTER — Other Ambulatory Visit: Payer: Self-pay | Admitting: Emergency Medicine

## 2024-02-26 DIAGNOSIS — I6523 Occlusion and stenosis of bilateral carotid arteries: Secondary | ICD-10-CM

## 2024-02-26 NOTE — Telephone Encounter (Signed)
 Called patient and notified him of the following from Dr. Gollan.  He has a history of carotid disease, carotid endarterectomy Unable to exclude a TIA given recent events   We should order a carotid ultrasound to follow-up on his known carotid disease Thx TGollan  Patient verbalizes understanding. Order placed for Carotid Ultra Sound.

## 2024-02-28 ENCOUNTER — Ambulatory Visit

## 2024-02-28 ENCOUNTER — Encounter: Payer: Self-pay | Admitting: Physician Assistant

## 2024-02-28 ENCOUNTER — Ambulatory Visit: Payer: Self-pay | Admitting: Physician Assistant

## 2024-02-28 ENCOUNTER — Ambulatory Visit: Attending: Physician Assistant | Admitting: Physician Assistant

## 2024-02-28 VITALS — BP 108/68 | HR 64 | Ht 70.0 in | Wt 228.0 lb

## 2024-02-28 DIAGNOSIS — I6523 Occlusion and stenosis of bilateral carotid arteries: Secondary | ICD-10-CM | POA: Diagnosis not present

## 2024-02-28 DIAGNOSIS — I214 Non-ST elevation (NSTEMI) myocardial infarction: Secondary | ICD-10-CM

## 2024-02-28 DIAGNOSIS — E782 Mixed hyperlipidemia: Secondary | ICD-10-CM | POA: Diagnosis not present

## 2024-02-28 DIAGNOSIS — G459 Transient cerebral ischemic attack, unspecified: Secondary | ICD-10-CM

## 2024-02-28 DIAGNOSIS — Z951 Presence of aortocoronary bypass graft: Secondary | ICD-10-CM | POA: Diagnosis not present

## 2024-02-28 DIAGNOSIS — I10 Essential (primary) hypertension: Secondary | ICD-10-CM

## 2024-02-28 DIAGNOSIS — I200 Unstable angina: Secondary | ICD-10-CM

## 2024-02-28 DIAGNOSIS — I25118 Atherosclerotic heart disease of native coronary artery with other forms of angina pectoris: Secondary | ICD-10-CM

## 2024-02-28 LAB — ECHOCARDIOGRAM COMPLETE BUBBLE STUDY
AR max vel: 1.27 cm2
AV Area VTI: 1.34 cm2
AV Area mean vel: 1.2 cm2
AV Mean grad: 9 mmHg
AV Peak grad: 16.6 mmHg
Ao pk vel: 2.04 m/s
Area-P 1/2: 3.37 cm2
S' Lateral: 2.8 cm

## 2024-02-28 NOTE — Patient Instructions (Signed)
 Medication Instructions:  Your physician recommends the following medication changes.  STOP TAKING: LOSARTAN    Lab Work: No labs ordered today  If you have labs (blood work) drawn today and your tests are completely normal, you will receive your results only by: MyChart Message (if you have MyChart) OR A paper copy in the mail If you have any lab test that is abnormal or we need to change your treatment, we will call you to review the results.  Testing/Procedures: Your physician has requested that you have an echocardiogram. Echocardiography is a painless test that uses sound waves to create images of your heart. It provides your doctor with information about the size and shape of your heart and how well your hearts chambers and valves are working.   You may receive an ultrasound enhancing agent through an IV if needed to better visualize your heart during the echo. This procedure takes approximately one hour.  There are no restrictions for this procedure.  This will take place at [address] [facility] [address]) [address], [place] [address]  Please note: We ask at that you not bring children with you during ultrasound (echo/ vascular) testing. Due to room size and safety concerns, children are not allowed in the ultrasound rooms during exams. Our front office staff cannot provide observation of children in our lobby area while testing is being conducted. An adult accompanying a patient to their appointment will only be allowed in the ultrasound room at the discretion of the ultrasound technician under special circumstances. We apologize for any inconvenience.   Follow-Up: At [facility], you and your health needs are our priority.  As part of our continuing mission to provide you with exceptional heart care, our providers are all part of one team.  This team includes your primary Cardiologist (physician) and Advanced Practice Providers or APPs (Physician Assistants and  Nurse Practitioners) who all work together to provide you with the care you need, when you need it.  Your next appointment:   2 month(s)  Provider:   You may see Timothy Gollan, MD or one of the following Advanced Practice Providers on your designated Care Team:   Lonni Meager, NP Lesley Maffucci, PA-C Bernardino Bring, PA-C Cadence Shafter, PA-C Tylene Lunch, NP Barnie Hila, NP    We recommend signing up for the patient portal called MyChart.  Sign up information is provided on this After Visit Summary.  MyChart is used to connect with patients for Virtual Visits (Telemedicine).  Patients are able to view lab/test results, encounter notes, upcoming appointments, etc.  Non-urgent messages can be sent to your provider as well.   To learn more about what you can do with MyChart, go to [email or web address].   Other Instructions Your physician has recommended that you wear a Zio monitor.   This monitor is a medical device that records the hearts electrical activity. Doctors most often use these monitors to diagnose arrhythmias. Arrhythmias are problems with the speed or rhythm of the heartbeat. The monitor is a small device applied to your chest. You can wear one while you do your normal daily activities. While wearing this monitor if you have any symptoms to push the button and record what you felt. Once you have worn this monitor for the period of time provider prescribed (Usually 14 days), you will return the monitor device in the postage paid box. Once it is returned they will download the data collected and provide us  with a report which the provider  will then review and we will call you with those results. Important tips:  Avoid showering during the first 24 hours of wearing the monitor. Avoid excessive sweating to help maximize wear time. Do not submerge the device, no hot tubs, and no swimming pools. Keep any lotions or oils away from the patch. After 24 hours you may shower with  the patch on. Take brief showers with your back facing the shower head.  Do not remove patch once it has been placed because that will interrupt data and decrease adhesive wear time. Push the button when you have any symptoms and write down what you were feeling. Once you have completed wearing your monitor, remove and place into box which has postage paid and place in your outgoing mailbox.  If for some reason you have misplaced your box then call our office and we can provide another box and/or mail it off for you.    Your physician has requested that you have an MRI of the brain

## 2024-02-28 NOTE — Progress Notes (Signed)
 Cardiology Office Note    Date:  02/28/2024   ID:  Jake Castro, DOB 28-Aug-1950, MRN [id number]  PCP:  Glover Lenis, MD  Cardiologist:  Evalene Lunger, MD  Electrophysiologist:  None   Chief Complaint: Follow up  History of Present Illness:   Jake Castro is a 73 y.o. male with history of coronary artery disease s/p CABG, type 2 diabetes, hyperlipidemia, hypertension, PAD, carotid stenosis s/p CEA, and prior tobacco use who presents for follow up on CAD.    History of L CEA in 2005 and NSTEMI in 2008 with PCI of the RCA at that time.  Abnormal stress testing in 2015 prompted CABG x 4.  Most recent ischemic evaluation in 04/2020 with left heart cath showed occluded vein graft to the RCA, occluded vein graft to the diagonal, patent vein graft to the OM with severe distal left main, ostial LAD, and mid LAD disease estimated to be 70 to 80%.  Echo at that time showed EF 55 to 60% with moderate concentric LVH, G3 DD, and trivial MR.   Patient was most recently seen by Dr. Gollan 02/05/2023 and was overall feeling well.  He reported not taking his evening meds and sometimes missing his morning medications.  He was working to lose weight by going to the gym, also on Ozempic.  He was transitioned to Coreg  CR for compliance.    Patient presents today with concern for an episode of visual disturbance this past Sunday. He reports that he was sitting in the pew and it was quite bright outside. He reports that he noticed his left eye go blurry. He could still see the light from outside and the Christmas lights but they were blurry. This was associated with some pressure on the left side of his face. The vision in his right eye was normal. He reports this lasted for approximately 30 minutes before slowly resolving and vision returning to normal. He denies any associated weakness, change in speech or facial droop. He was seen by ophthalmology earlier this week and no abnormalities were found. They  directed him to follow up with cardiology. He called our office and carotid dopplers were ordered by Dr. Gollan. Today, he reports feeling well without any recurrence of symptoms. He has a history of CEA but is not sure when the last time he had any imaging done of the carotid arteries. I do not see any imaging in our electronic health records since 2015 and I am unable to view these results. He has been fairly sedentary recently but denies any DOE or angina with exertion.  He gets occasional left-sided fleeting chest tightness which he believes is musculoskeletal in nature.  He denies palpitations, lightheadedness, dizziness, and lower extremity swelling.  Him and his wife are traveling to [place] at the end of this month and will return in February.  [place].  Labs independently reviewed: 07/2023-Hgb 12.3, HCT 39, platelets 262, sodium 142, potassium 4.5, BUN 29, creatinine 1.1, normal LFTs, A1c 6.4 01/2023-TC 135, TG 69, HDL 53, LDL 68  Objective   Past Medical History:  Diagnosis Date   CAD (coronary artery disease)    a. 12/2006 NSTEMI/PCI: RCA 5m/d->mid vessel stented, unable to cross dist dzs w/ balloon;  b. 11/2013 NL MV;  c. 11/2013 Echo: EF 55%;  d. 12/2013 Cath: LM 95 ost, LAD 70p, 19m, LCX 30p/m, OM1 60, RCA 20p, 81m/70m, 95d.   Carotid arterial disease  a. s/p LCEA   Diabetes mellitus (HCC)    type 2   HTN (hypertension)    Hyperlipidemia    Hypertension    Morbid obesity (HCC)    Myocardial infarction (HCC)    Neuromuscular disorder (HCC)    neuropathy left foot   Neuropathy    LEFT FOOT   Plantar fasciitis    Pneumonia    Wears dentures    full upper and lower partial    Current Medications: Active Medications[1]  Allergies:   Kiwi extract   Social History   Socioeconomic History   Marital status: Married    Spouse name: Not on file   Number of children: Not on file   Years of education: Not on file   Highest education  level: Not on file  Occupational History   Not on file  Tobacco Use   Smoking status: Former    Current packs/day: 0.00    Average packs/day: 0.5 packs/day for 1 year (0.5 ttl pk-yrs)    Types: Cigarettes    Start date: 05/04/1964    Quit date: 05/04/1965    Years since quitting: 58.8   Smokeless tobacco: Never   Tobacco comments:    smoked for a few months in his early 20's.  Vaping Use   Vaping status: Never Used  Substance and Sexual Activity   Alcohol use: No   Drug use: No    Types: Marijuana    Comment: Previously used illicit drugs.  None in > 15 yrs.   Sexual activity: Not on file  Other Topics Concern   Not on file  Social History Narrative   Lives in [place] with his wife.  Was not routinely exercising.   Social Drivers of Health   Tobacco Use: Medium Risk (02/28/2024)   Patient History    Smoking Tobacco Use: Former    Smokeless Tobacco Use: Never    Passive Exposure: Not on file  Financial Resource Strain: Low Risk  (01/26/2023)   Received from [facility]   Overall Financial Resource Strain (CARDIA)    Difficulty of Paying Living Expenses: Not hard at all  Food Insecurity: No Food Insecurity (01/26/2023)   Received from [facility]   Epic    Within the past 12 months, you worried that your food would run out before you got the money to buy more.: Never true    Within the past 12 months, the food you bought just didn't last and you didn't have money to get more.: Never true  Transportation Needs: No Transportation Needs (01/26/2023)   Received from [facility] - Transportation    In the past 12 months, has lack of transportation kept you from medical appointments or from getting medications?: No    Lack of Transportation (Non-Medical): No  Physical Activity: Not on file  Stress: Not on file  Social Connections: Not on file  Depression (EYV7-0): Not on file  Alcohol Screen: Not on file   Housing: Low Risk  (08/01/2023)   Received from [facility]   Epic    In the last 12 months, was there a time when you were not able to pay the mortgage or rent on time?: No    In the past 12 months, how many times have you moved where you were living?: 0    At any time in the past 12 months, were you homeless or living in a shelter (including now)?: No  Utilities: Not At Risk (01/26/2023)   Received from [facility] Utilities    Threatened with loss of utilities: No  Health Literacy: Not on file     Family History:  The patient's family history includes COPD in his mother; Peripheral vascular disease in his brother and father; Stroke (age of onset: 73) in his brother.  ROS:   12-point review of systems is negative unless otherwise noted in the HPI.  EKGs/Other Studies Reviewed:    Studies reviewed were summarized above. The additional studies were reviewed today:  04/2020 LHC RPDA lesion is 70% stenosed. Ost LM to Ost LAD lesion is 75% stenosed with 70% stenosed side branch in Ost Cx. Mid LAD lesion is 80% stenosed. Prox RCA lesion is 40% stenosed. Origin lesion is 100% stenosed. Prox Graft lesion is 100% stenosed.   Treat medically, PLV branch too small for PCI. LIMA to LAD patent, SVG to Om patent, SVG to PDA and Diagnal occluded.  04/2020 Echo complete  1. Left ventricular ejection fraction, by estimation, is 55 to 60%. The  left ventricle has normal function. The left ventricle has no regional  wall motion abnormalities. There is moderate concentric left ventricular  hypertrophy. Left ventricular  diastolic parameters are consistent with Grade III diastolic dysfunction  (restrictive).   2. Right ventricular systolic function is normal. The right ventricular  size is mildly enlarged.   3. Left atrial size was moderately dilated.   4. Right atrial size was moderately dilated.   5. The mitral valve is degenerative. Trivial mitral  valve regurgitation.  No evidence of mitral stenosis. Moderate to severe mitral annular  calcification.   6. The aortic valve is calcified. Aortic valve regurgitation is not  visualized. Mild to moderate aortic valve sclerosis/calcification is  present, without any evidence of aortic stenosis.   7. The inferior vena cava is normal in size with greater than 50%  respiratory variability, suggesting right atrial pressure of 3 mmHg.   EKG:  EKG personally reviewed by me today EKG Interpretation Date/Time:  Thursday February 28 2024 09:50:47 EST Ventricular Rate:  64 PR Interval:  178 QRS Duration:  132 QT Interval:  424 QTC Calculation: 437 R Axis:   4  Text Interpretation: Normal sinus rhythm with sinus arrhythmia Right bundle branch block Inferior infarct (cited on or before 27-Feb-1995) Confirmed by Lorene Sinclair ([id number]) on 02/28/2024 10:00:52 AM  PHYSICAL EXAM:    VS:  BP 108/68 (BP Location: Left Arm, Patient Position: Sitting, Cuff Size: Normal)   Pulse 64   Ht 5' 10 (1.778 m)   Wt 228 lb (103.4 kg)   SpO2 97%   BMI 32.71 kg/m   BMI: Body mass index is 32.71 kg/m.  GEN: Well nourished, well developed in no acute distress NECK: No JVD; + carotid bruits bilaterally CARDIAC: RRR, no murmurs, rubs, gallops RESPIRATORY:  Clear to auscultation without rales, wheezing or rhonchi  ABDOMEN: Soft, non-tender, non-distended EXTREMITIES: No edema; No deformity  Wt Readings from Last 3 Encounters:  02/28/24 228 lb (103.4 kg)  02/05/23 211 lb 8 oz (95.9 kg)  11/23/21 237 lb 6 oz (107.7 kg)                  ASSESSMENT & PLAN:   Possible TIA - Symptoms of unilateral vision changes. Evaluated by ophthalmology earlier this week without any acute findings. Symptoms are concerning for TIA, especially given his history of carotid artery disease. Repeat carotid dopplers have been  ordered by primary cardiologist. Recommend obtaining MRI of the brain, echo bubble study, and 2 week Zio  monitor for further evaluation. He is continued on aspirin  and statin therapy.   Carotid artery disease - S/p left CEA in 2005. Most recent imaging from 2015 if unavailable to review. Carotid bruits heard bilaterally on exam. Recommend carotid dopplers as above.   Coronary artery disease - No symptoms concerning for angina. No further testing indicated at this time. Continue ASA, carvedilol , Imdur , rosuvastatin , and Brilinta .   Hyperlipidemia - Most recent lipid panel 01/2023 with LDL 68. Consider more aggressive management of cholesterol at follow up. Continue rosuvastatin .   Essential hypertension - He is no longer taking losartan.  BP well controlled on current regimen.    Disposition: Obtain MRI of the brain, 2-week ZIO XT, echocardiogram with bubble study, and carotid Dopplers.  F/u with Dr. Gollan in February.   Medication Adjustments/Labs and Tests Ordered: Current medicines are reviewed at length with the patient today.  Concerns regarding medicines are outlined above. Medication changes, Labs and Tests ordered today are summarized above and listed in the Patient Instructions accessible in Encounters.   Bonney Lesley Maffucci, PA-C 02/28/2024 10:48 AM     [facility] [place] [address] [place], [place] [address] [phone]      [1]  Current Meds  Medication Sig   acetaminophen  (TYLENOL ) 325 MG tablet Take 2 tablets (650 mg total) by mouth every 6 (six) hours as needed for mild pain (or Fever >/= 101).   aspirin  EC 81 MG tablet Take 81 mg by mouth daily. Swallow whole.   carvedilol  (COREG  CR) 10 MG 24 hr capsule Take 1 capsule (10 mg total) by mouth daily.   chlorhexidine  (PERIDEX ) 0.12 % solution chlorhexidine  gluconate 0.12 % mouthwash  RINSE AND SPIT WITH 1/2 OZ FOR 30 SECONDS BID FOR 2 WEEKS   Cholecalciferol  (VITAMIN D -3) 5000 UNITS TABS Take 5,000 mg by mouth daily.   Exenatide  ER 2 MG SRER Inject into the skin once a week.    fenofibrate (TRICOR) 145 MG tablet Take 145 mg by mouth daily.   isosorbide  mononitrate (IMDUR ) 30 MG 24 hr tablet Take 1 tablet (30 mg total) by mouth 2 (two) times daily.   l-methylfolate-B6-B12 (METANX) 3-35-2 MG TABS tablet Take 1 tablet by mouth daily. am   metFORMIN  (GLUCOPHAGE ) 1000 MG tablet Take 1,000 mg by mouth 2 (two) times daily with a meal. Am and bedtime   MOUNJARO 15 MG/0.5ML Pen Inject 15 mg into the skin once a week.   Multiple Vitamin (MULTIVITAMIN) capsule Take 1 capsule by mouth daily.   rosuvastatin  (CRESTOR ) 40 MG tablet Take 1 tablet (40 mg total) by mouth daily.   ticagrelor  (BRILINTA ) 60 MG TABS tablet Take 1 tablet (60 mg total) by mouth 2 (two) times daily.   [DISCONTINUED] losartan (COZAAR) 50 MG tablet Take 1 tablet by mouth daily.

## 2024-02-29 NOTE — Progress Notes (Signed)
 Last read by Lamar VEAR Lunger at 5:14AM on 02/29/2024.

## 2024-03-04 ENCOUNTER — Ambulatory Visit: Admission: RE | Admit: 2024-03-04 | Discharge: 2024-03-04 | Attending: Physician Assistant

## 2024-03-04 DIAGNOSIS — G459 Transient cerebral ischemic attack, unspecified: Secondary | ICD-10-CM | POA: Diagnosis present

## 2024-03-04 MED ORDER — GADOBUTROL 1 MMOL/ML IV SOLN
10.0000 mL | Freq: Once | INTRAVENOUS | Status: AC | PRN
  Administered 2024-03-04: 11:00:00 10 mL via INTRAVENOUS

## 2024-03-10 ENCOUNTER — Ambulatory Visit: Attending: Cardiovascular Disease

## 2024-03-10 DIAGNOSIS — I6523 Occlusion and stenosis of bilateral carotid arteries: Secondary | ICD-10-CM | POA: Diagnosis not present

## 2024-03-14 ENCOUNTER — Telehealth: Payer: Self-pay | Admitting: Cardiovascular Disease

## 2024-03-14 NOTE — Telephone Encounter (Signed)
 Pt wife calling to see if he needs a check up before they go out of town 12/30-1/22 due to the Carotid he had done.

## 2024-03-14 NOTE — Telephone Encounter (Signed)
 Called and left voicemail on home phone to call back with call back number provided.

## 2024-03-16 ENCOUNTER — Ambulatory Visit: Payer: Self-pay | Admitting: Cardiovascular Disease

## 2024-03-16 DIAGNOSIS — I779 Disorder of arteries and arterioles, unspecified: Secondary | ICD-10-CM

## 2024-03-17 NOTE — Telephone Encounter (Signed)
 Spoke with patient regarding question from 12/26. Patient called in asking about the need for sooner follow up appointment for carotid results. Based on recommendations from Dr Gollan, he states to repeat carotid ultrasound in 1 year and patient okay to travel. Will follow up as advised. Patient thankful for call back.

## 2024-03-29 DIAGNOSIS — G459 Transient cerebral ischemic attack, unspecified: Secondary | ICD-10-CM | POA: Diagnosis not present

## 2024-03-29 DIAGNOSIS — I200 Unstable angina: Secondary | ICD-10-CM | POA: Diagnosis not present

## 2024-03-29 DIAGNOSIS — I214 Non-ST elevation (NSTEMI) myocardial infarction: Secondary | ICD-10-CM

## 2024-05-02 ENCOUNTER — Ambulatory Visit: Admitting: Cardiovascular Disease

## 2024-05-14 ENCOUNTER — Ambulatory Visit: Admitting: Cardiovascular Disease
